# Patient Record
Sex: Male | Born: 1970 | Race: White | Hispanic: No | Marital: Married | Smoking: Former smoker
Health system: Southern US, Community
[De-identification: ages and names within clinical notes are randomized; demographics above are authoritative.]

## PROBLEM LIST (undated history)

## (undated) DIAGNOSIS — R5381 Other malaise: Secondary | ICD-10-CM

## (undated) DIAGNOSIS — T300 Burn of unspecified body region, unspecified degree: Secondary | ICD-10-CM

## (undated) DIAGNOSIS — K760 Fatty (change of) liver, not elsewhere classified: Secondary | ICD-10-CM

## (undated) DIAGNOSIS — T7840XA Allergy, unspecified, initial encounter: Secondary | ICD-10-CM

## (undated) DIAGNOSIS — R5383 Other fatigue: Secondary | ICD-10-CM

## (undated) DIAGNOSIS — D649 Anemia, unspecified: Secondary | ICD-10-CM

## (undated) DIAGNOSIS — R591 Generalized enlarged lymph nodes: Secondary | ICD-10-CM

## (undated) DIAGNOSIS — Z87891 Personal history of nicotine dependence: Secondary | ICD-10-CM

## (undated) DIAGNOSIS — L80 Vitiligo: Secondary | ICD-10-CM

## (undated) DIAGNOSIS — E291 Testicular hypofunction: Secondary | ICD-10-CM

## (undated) DIAGNOSIS — D235 Other benign neoplasm of skin of trunk: Secondary | ICD-10-CM

## (undated) DIAGNOSIS — E781 Pure hyperglyceridemia: Secondary | ICD-10-CM

## (undated) HISTORY — DX: Anemia, unspecified: D64.9

## (undated) HISTORY — DX: Pure hyperglyceridemia: E78.1

## (undated) HISTORY — DX: Other malaise: R53.81

## (undated) HISTORY — DX: Fatty (change of) liver, not elsewhere classified: K76.0

## (undated) HISTORY — PX: OTHER SURGICAL HISTORY: SHX169

## (undated) HISTORY — DX: Allergy, unspecified, initial encounter: T78.40XA

## (undated) HISTORY — DX: Other malaise: R53.83

## (undated) HISTORY — DX: Personal history of nicotine dependence: Z87.891

## (undated) HISTORY — DX: Other benign neoplasm of skin of trunk: D23.5

## (undated) HISTORY — DX: Burn of unspecified body region, unspecified degree: T30.0

## (undated) HISTORY — DX: Testicular hypofunction: E29.1

## (undated) HISTORY — DX: Generalized enlarged lymph nodes: R59.1

## (undated) HISTORY — DX: Vitiligo: L80

---

## 1996-06-12 HISTORY — PX: OTHER SURGICAL HISTORY: SHX169

## 2005-01-04 ENCOUNTER — Ambulatory Visit: Payer: Self-pay | Admitting: Family Medicine

## 2005-02-03 ENCOUNTER — Encounter: Admission: RE | Admit: 2005-02-03 | Discharge: 2005-02-03 | Payer: Self-pay | Admitting: Surgery

## 2005-03-04 ENCOUNTER — Ambulatory Visit (HOSPITAL_COMMUNITY): Admission: RE | Admit: 2005-03-04 | Discharge: 2005-03-04 | Payer: Self-pay | Admitting: Surgery

## 2005-03-04 ENCOUNTER — Encounter (INDEPENDENT_AMBULATORY_CARE_PROVIDER_SITE_OTHER): Payer: Self-pay | Admitting: Specialist

## 2005-06-16 ENCOUNTER — Ambulatory Visit: Payer: Self-pay | Admitting: Family Medicine

## 2005-06-22 ENCOUNTER — Ambulatory Visit: Payer: Self-pay | Admitting: Family Medicine

## 2006-03-27 HISTORY — PX: OTHER SURGICAL HISTORY: SHX169

## 2006-04-20 ENCOUNTER — Ambulatory Visit: Payer: Self-pay | Admitting: Family Medicine

## 2006-04-21 ENCOUNTER — Ambulatory Visit: Payer: Self-pay | Admitting: Internal Medicine

## 2006-05-24 ENCOUNTER — Ambulatory Visit: Payer: Self-pay | Admitting: Family Medicine

## 2006-05-29 ENCOUNTER — Encounter (INDEPENDENT_AMBULATORY_CARE_PROVIDER_SITE_OTHER): Payer: Self-pay | Admitting: Specialist

## 2006-05-29 ENCOUNTER — Ambulatory Visit (HOSPITAL_BASED_OUTPATIENT_CLINIC_OR_DEPARTMENT_OTHER): Admission: RE | Admit: 2006-05-29 | Discharge: 2006-05-29 | Payer: Self-pay | Admitting: Surgery

## 2006-06-09 ENCOUNTER — Ambulatory Visit: Payer: Self-pay | Admitting: Family Medicine

## 2006-06-29 ENCOUNTER — Ambulatory Visit: Payer: Self-pay | Admitting: Family Medicine

## 2006-08-08 ENCOUNTER — Ambulatory Visit: Payer: Self-pay | Admitting: Family Medicine

## 2006-11-01 ENCOUNTER — Telehealth (INDEPENDENT_AMBULATORY_CARE_PROVIDER_SITE_OTHER): Payer: Self-pay | Admitting: *Deleted

## 2006-11-03 ENCOUNTER — Encounter: Payer: Self-pay | Admitting: Family Medicine

## 2006-11-03 DIAGNOSIS — E781 Pure hyperglyceridemia: Secondary | ICD-10-CM | POA: Insufficient documentation

## 2006-11-03 DIAGNOSIS — K760 Fatty (change of) liver, not elsewhere classified: Secondary | ICD-10-CM | POA: Insufficient documentation

## 2006-11-03 DIAGNOSIS — R7309 Other abnormal glucose: Secondary | ICD-10-CM | POA: Insufficient documentation

## 2006-11-14 ENCOUNTER — Ambulatory Visit: Payer: Self-pay | Admitting: Family Medicine

## 2006-12-11 ENCOUNTER — Encounter: Payer: Self-pay | Admitting: Family Medicine

## 2007-04-05 ENCOUNTER — Ambulatory Visit (HOSPITAL_BASED_OUTPATIENT_CLINIC_OR_DEPARTMENT_OTHER): Admission: RE | Admit: 2007-04-05 | Discharge: 2007-04-05 | Payer: Self-pay | Admitting: Surgery

## 2007-04-05 ENCOUNTER — Encounter: Payer: Self-pay | Admitting: Family Medicine

## 2007-04-05 HISTORY — PX: INGUINAL HERNIA REPAIR: SUR1180

## 2008-04-09 ENCOUNTER — Telehealth: Payer: Self-pay | Admitting: Family Medicine

## 2008-04-17 ENCOUNTER — Ambulatory Visit: Payer: Self-pay | Admitting: Family Medicine

## 2008-05-16 ENCOUNTER — Telehealth: Payer: Self-pay | Admitting: Internal Medicine

## 2008-12-08 ENCOUNTER — Encounter: Payer: Self-pay | Admitting: Family Medicine

## 2008-12-09 ENCOUNTER — Ambulatory Visit: Payer: Self-pay | Admitting: Family Medicine

## 2008-12-09 DIAGNOSIS — R079 Chest pain, unspecified: Secondary | ICD-10-CM | POA: Insufficient documentation

## 2009-02-27 ENCOUNTER — Ambulatory Visit: Payer: Self-pay | Admitting: Family Medicine

## 2009-02-27 LAB — CONVERTED CEMR LAB
ALT: 25 units/L (ref 0–53)
Basophils Relative: 0.2 % (ref 0.0–3.0)
Bilirubin, Direct: 0.1 mg/dL (ref 0.0–0.3)
Chloride: 107 meq/L (ref 96–112)
Cholesterol: 183 mg/dL (ref 0–200)
Creatinine, Ser: 1.1 mg/dL (ref 0.4–1.5)
Direct LDL: 136.1 mg/dL
Eosinophils Absolute: 0.1 10*3/uL (ref 0.0–0.7)
GFR calc non Af Amer: 79.07 mL/min (ref >60)
HCT: 46.6 % (ref 39.0–52.0)
Hemoglobin: 16.4 g/dL (ref 13.0–17.0)
Lymphs Abs: 1.6 10*3/uL (ref 0.7–4.0)
MCHC: 35.1 g/dL (ref 30.0–36.0)
MCV: 84.3 fL (ref 78.0–100.0)
Monocytes Absolute: 0.4 10*3/uL (ref 0.1–1.0)
Neutro Abs: 4.8 10*3/uL (ref 1.4–7.7)
Neutrophils Relative %: 67.8 % (ref 43.0–77.0)
RBC: 5.53 M/uL (ref 4.22–5.81)
Total Bilirubin: 1.1 mg/dL (ref 0.3–1.2)
Total CHOL/HDL Ratio: 8
VLDL: 42.8 mg/dL — ABNORMAL HIGH (ref 0.0–40.0)

## 2009-03-03 ENCOUNTER — Ambulatory Visit: Payer: Self-pay | Admitting: Family Medicine

## 2009-03-03 DIAGNOSIS — L80 Vitiligo: Secondary | ICD-10-CM | POA: Insufficient documentation

## 2009-12-02 ENCOUNTER — Encounter: Payer: Self-pay | Admitting: Family Medicine

## 2009-12-02 ENCOUNTER — Ambulatory Visit: Payer: Self-pay | Admitting: Family Medicine

## 2010-01-12 ENCOUNTER — Ambulatory Visit: Payer: Self-pay | Admitting: Family Medicine

## 2010-01-25 ENCOUNTER — Ambulatory Visit: Payer: Self-pay | Admitting: Family Medicine

## 2010-01-25 DIAGNOSIS — A281 Cat-scratch disease: Secondary | ICD-10-CM | POA: Insufficient documentation

## 2010-01-27 ENCOUNTER — Emergency Department (HOSPITAL_COMMUNITY): Admission: EM | Admit: 2010-01-27 | Discharge: 2010-01-27 | Payer: Self-pay | Admitting: Emergency Medicine

## 2010-01-30 ENCOUNTER — Emergency Department (HOSPITAL_COMMUNITY): Admission: EM | Admit: 2010-01-30 | Discharge: 2010-01-30 | Payer: Self-pay | Admitting: Emergency Medicine

## 2010-02-02 ENCOUNTER — Encounter (INDEPENDENT_AMBULATORY_CARE_PROVIDER_SITE_OTHER): Payer: Self-pay | Admitting: *Deleted

## 2010-02-03 ENCOUNTER — Emergency Department (HOSPITAL_COMMUNITY): Admission: EM | Admit: 2010-02-03 | Discharge: 2010-02-03 | Payer: Self-pay | Admitting: Family Medicine

## 2010-02-10 ENCOUNTER — Emergency Department (HOSPITAL_COMMUNITY): Admission: EM | Admit: 2010-02-10 | Discharge: 2010-02-10 | Payer: Self-pay | Admitting: Emergency Medicine

## 2010-02-25 HISTORY — PX: MOUTH SURGERY: SHX715

## 2010-04-20 ENCOUNTER — Telehealth: Payer: Self-pay | Admitting: Family Medicine

## 2010-07-19 ENCOUNTER — Ambulatory Visit
Admission: RE | Admit: 2010-07-19 | Discharge: 2010-07-19 | Payer: Self-pay | Source: Home / Self Care | Attending: Family Medicine | Admitting: Family Medicine

## 2010-07-19 LAB — CONVERTED CEMR LAB
Bilirubin Urine: NEGATIVE
Blood in Urine, dipstick: NEGATIVE
Glucose, Urine, Semiquant: NEGATIVE
Ketones, urine, test strip: NEGATIVE
Protein, U semiquant: NEGATIVE
Specific Gravity, Urine: 1.01
pH: 6

## 2010-07-27 NOTE — Progress Notes (Signed)
Summary: ??kidney stones   Phone Note Call from Patient Call back at Home Phone [phone]   Caller: Patient Call For: Dr. Milinda Antis  Summary of Call: Patient called stating that he feels like he has kidney stones. He has been having alot of back pain. He said that it is a sharp pain on the left side.  He says that he is not having any trouble urinating, hasn't noticed any blood in urine. He doesn't want to make an appt just yet. He would like to see if this passes on it's on. He is asking if you have any suggestions that he can do. He said that he will call back in a couple of days if he is still having pain.  Initial call taken by: Melody Comas,  April 20, 2010 1:22 PM  Follow-up for Phone Call        he does need to follow up for this with first availible physician drink lots of water in meantime and go to eR if severe back pain Follow-up by: Judith Part MD,  April 20, 2010 1:42 PM  Additional Follow-up for Phone Call Additional follow up Details #1::        Spoke with patient, advised him to dring plenty of water. Advised him to make appt. He made appt. for thursday morning w/ Dr. Reece Agar and will call back to cancel if he feels that he doesn't need appt.  Additional Follow-up by: Melody Comas,  April 20, 2010 1:50 PM

## 2010-07-27 NOTE — Letter (Signed)
Summary: Nadara Eaton letter  [place] at [facility]  [address] [place], [place] [address]   Phone: [phone]  Fax: [phone]       02/02/2010 MRN: [id number]  BARTOW ZYLSTRA [address] [place], [place]  [address]  Dear Mr. [facility] [place], and [place] announce the retirement of Arta Silence, M.D., from full-time practice at the [facility] office effective December 24, 2009 and his plans of returning part-time.  It is important to Dr. Hetty Ely and to our practice that you understand that [facility] [place] has seven physicians in our office for your health care needs.  We will continue to offer the same exceptional care that you have today.    Dr. Hetty Ely has spoken to many of you about his plans for retirement and returning part-time in the fall.   We will continue to work with you through the transition to schedule appointments for you in the office and meet the high standards that [facility] is committed to.   Again, it is with great pleasure that we share the news that Dr. Hetty Ely will return to [facility] in October of 2011 with a reduced schedule.    If you have any questions, or would like to request an appointment with one of our physicians, please call us at [phone] and press the option for Scheduling an appointment.  We take pleasure in providing you with excellent patient care and look forward to seeing you at your next office visit.  Our [facility] Physicians are:  Tillman Abide, M.D. Laurita Quint, M.D. Roxy Manns, M.D. Kerby Nora, M.D. Hannah Beat, M.D. Ruthe Mannan, M.D. We proudly welcomed Raechel Ache, M.D. and Eustaquio Boyden, M.D. to the practice in July/August 2011.  Sincerely,  [facility]

## 2010-07-27 NOTE — Assessment & Plan Note (Signed)
Summary: RIB PAIN/CLE   Vital Signs:  Patient profile:   40 year old male Weight:      219 pounds BMI:     27.47 Temp:     97.9 degrees F oral Pulse rate:   84 / minute Pulse rhythm:   regular BP sitting:   110 / 74  (left arm) Cuff size:   large  Vitals Entered By: Sydell Axon LPN (December 03, 3555 12:12 PM) CC: Left side rib pain   History of Present Illness: Pt here for left sided chest pain that started last night. He had a difficult time sleeping last night due to inablility to get comfortable. He was swimming yesterday afternoon with his wife4, did nothing unusual that he remembers. He did get out the side of the pool using his hands/arms without use of a .ladder...jumping up onto the platform using his arms. He does not remember feeling anything unusual. He has never had pain in the lateral side of the chest before. He denies bites and has no echymosis. Movement of his arms bothers hime and deep breating bothers him. No position last night seemed to help. He has not taken any medication.  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 Head:  Normocephalic and atraumatic without obvious abnormalities. No apparent alopecia or balding. Sinuses NT. Eyes:  Conjunctiva clear bilaterally.  Ears:  External ear exam shows no significant lesions or deformities.  Otoscopic examination reveals clear canals, tympanic membranes are intact  bilaterally without bulging, retraction, inflammation or discharge. Hearing is grossly normal bilaterally. Nose:  External nasal examination shows no deformity or inflammation. Nasal mucosa are pink and moist without lesions or exudates. Mouth:  Oral mucosa and oropharynx without lesions or exudates.  Teeth in good repair. Neck:  No deformities, masses, or tenderness noted. Chest Wall:  Tender to palpation in the area of fiftjh/sixth ribs laterally , no swelling or echymosis. He is unable to tell exact location of the discomfort. Lungs:  Normal respiratory effort, chest expands symmetrically. Lungs are clear to auscultation, no crackles or wheezes. Heart:  Normal rate and regular rhythm. S1 and S2 normal without gallop, murmur, click, rub or other extra sounds.   Impression & Recommendations:  Problem # 1:  CHEST PAIN (ICD-786.50) Will treat as chest wall musc strain, unknown etiology. See instructions. Orders: CXR- 2view (CXR)  Looks Nml, await reading.  Patient Instructions: 1)  Take Aleve 200mg , 3 Tabs after brfst, 2 tabs after supper.  2)  Use heat and ice as dir.  3)  May apply topical meds as needed.  Prior Medications (reviewed today): None Current Allergies (reviewed today): No known allergies

## 2010-07-27 NOTE — Assessment & Plan Note (Signed)
Summary: ACUTE EAR INFECTION/RBH   Vital Signs:  Patient profile:   40 year old male Height:      75 inches Weight:      220.25 pounds BMI:     27.63 Temp:     98.2 degrees F oral Pulse rate:   76 / minute Pulse rhythm:   regular BP sitting:   104 / 70  (left arm) Cuff size:   large  Vitals Entered By: Lewanda Rife LPN (January 12, 2010 4:19 PM) CC: Acute rt ear infection. pt said rt ear and jaw has hurt for 3 weeks. irritated throat and h/a on and off   History of Present Illness: R ear hurts for 3 weeks - thought it was swimmer's ear- has a pool  is sore to the touch cannot hear well out of it  used ear relief drops otc  advil orally   now jaw hurts and throat is irritated  gums feel swollen -- does have bad teeth as well  hears clicking and popping inside ear  some headache no fever -- but temp is higher than usual today     Allergies (verified): No Known Drug Allergies  Review of Systems General:  Complains of fatigue; denies fever, loss of appetite, and malaise. Eyes:  Denies blurring, discharge, and eye irritation. ENT:  Complains of earache, postnasal drainage, and sore throat; denies ear discharge, ringing in ears, and sinus pressure. CV:  Denies chest pain or discomfort and palpitations. Resp:  Denies cough and wheezing. GI:  Denies nausea and vomiting. Derm:  Denies itching, lesion(s), poor wound healing, and rash. Neuro:  Complains of headaches; denies tingling. Heme:  Denies abnormal bruising and bleeding.  Physical Exam  General:  Well-developed,well-nourished,in no acute distress; alert,appropriate and cooperative throughout examination Head:  normocephalic, atraumatic, and no abnormalities observed.  no sinus or temporal tenderness some mild tenderness over R TMJ and also clicking  Eyes:  vision grossly intact, pupils equal, pupils round, pupils reactive to light, and no injection.   Ears:  R ear - clear/ slt retracted TM with some erythema and swelling  of canal (no d/c) and very scant cerumen nl app L ear  Nose:  no nasal discharge.   Mouth:  pharynx pink and moist.   Neck:  No deformities, masses, or tenderness noted. Chest Wall:  No deformities, masses, tenderness or gynecomastia noted. Lungs:  Normal respiratory effort, chest expands symmetrically. Lungs are clear to auscultation, no crackles or wheezes. Heart:  Normal rate and regular rhythm. S1 and S2 normal without gallop, murmur, click, rub or other extra sounds. Skin:  Intact without suspicious lesions or rashes Cervical Nodes:  No lymphadenopathy noted Psych:  normal affect, talkative and pleasant    Impression & Recommendations:  Problem # 1:  OTITIS EXTERNA (ICD-380.10) Assessment New  with some swelling in R ear and muffled hearing (no d/c)  will tx with corticosporin drops and update also in light of some tenderness over TMJ- adv soft foods and no gum for the next week  pt advised to update me if symptoms worsen or do not improve - esp if fever or other symptoms His updated medication list for this problem includes:    Cortisporin 3.5-10000-1 Soln (Neomycin-polymyxin-hc) .Marland Kitchen... 2 drops in affected ear four times daily while awake  Orders: Prescription Created Electronically [phone])  Complete Medication List: 1)  Advil 200 Mg Tabs (Ibuprofen) .... Otc as directed. 2)  Cortisporin 3.5-10000-1 Soln (Neomycin-polymyxin-hc) .... 2 drops in affected ear four  times daily while awake  Patient Instructions: 1)  use warm compress under ear and over jaw joint when you can  2)  take advil 1-2 with food up to three times a day  3)  if not improving next week- call 4)  also avoid chewy foods and gum Prescriptions: CORTISPORIN 3.5-10000-1 SOLN (NEOMYCIN-POLYMYXIN-HC) 2 drops in affected ear four times daily while awake  #1 bottle x 0   Entered and Authorized by:   Judith Part MD   Signed by:   Judith Part MD on 01/12/2010   Method used:   Electronically to         [facility] (retail)       [address]       [place], [place]  [address]       Ph: [phone]       Fax: [phone]   RxID:   [id number]   Current Allergies (reviewed today): No known allergies

## 2010-07-27 NOTE — Letter (Signed)
Summary: Out of Work  [employer] at [facility]  [address] [place], [place] [address]   Phone: [phone]  Fax: [phone]    December 02, 2009   Employee:  Keith [employer] [facility]    To Whom It May Concern:   For Medical reasons, please excuse the above named employee from work today because of bieing seen for left lateral chestwall pain   Start:   12/02/2009  He is able to go back to work.  If you need additional information, please feel free to contact our office.         Sincerely,    Shaune Leeks MD

## 2010-07-27 NOTE — Assessment & Plan Note (Signed)
Summary: CAT BITE.DLO   Vital Signs:  Patient profile:   40 year old male Height:      75 inches Weight:      219.25 pounds BMI:     27.50 Temp:     98.2 degrees F oral Pulse rate:   84 / minute Pulse rhythm:   regular BP sitting:   110 / 72  (left arm) Cuff size:   large  Vitals Entered By: Lewanda Rife LPN (January 25, 2010 11:53 AM) CC: cat bite to left hand end of June. Now has lump under lt arm and upper arm hurts. found lump under arm on 01/22/10.   History of Present Illness: was bitten by a cat --he picked up a wild cat  bit him in the side of his hand  that was 3-4 weeks ago -- is well healed but now has a lump under it -not hurting or draining has LN under arm now that is   was provoked and he is not worried about rabies - [place]   Td 2006  no temp now felt a little fever at home -- felt warm in general  woke up with night sweats   no rash  has woken up with sore throat no headache   the LN is sore   has not taken any anti inflam -- ibuprofen occas     Allergies (verified): No Known Drug Allergies  Past History:  Past Surgical History: Last updated: 04/09/2007 ETT; NORMAL01/13/2003 TESTICULAR  ULTASOUND NORMAL: 10/30/2002 LYMOH NODE BIOPSY, RIGHT SUPRACLAV. AREA : NEGATIVE 04/2005 CHEST CT WITH CONTRAST , NEGATIVE; PELVIC CT, NEGATIVE: 02/03/2005 ABD CT WITH CONTRAST, MILD SPLENOMEGALY, FATTY LIVER, INDETERM UPPER ABD ADENOP 02/03/2005  NECK CT WITH CONTRAST RIGHT SUPRACLAV. ADENPOTHY----- BX 02/03/2005 RIGHT CERVICAL LYMPH NODE BIOPSY NEGATIVE: 05/29/2006 L ING HERNIA REPAIR (DR CORNETT) 04/05/2007  Family History: Last updated: 2009-03-16 Father: DECEASED 45  PER MOTHER OF MI Mother: A 69 , HTN ,DM GRANDPARENTS: GM : HTN SISTER A 41 HTN DM Obese CV. + FATHER DIED MI AGE 5 HBP: +MGM, MOTHER DM: + GESTATIONAL (SISTER) , MOTHER BREAST/OVARIAN/UTERINE/CANCER : NEGATIVE DEPRESSION: NEGATIVE ETOH/DRUG ABUSE: + ETOH M  UNCLE OTHER:NEGATIVE STROKE  Social History: Last updated: 11/03/2006 Marital Status: Married/ REMARRIED Lives w/ wife  11/2004 Children: 1 WITH WIFE Occupation: MAILMAN  Risk Factors: Alcohol Use: 0 (03/16/2009) Caffeine Use: >5 (March 16, 2009) Exercise: yes (Mar 16, 2009)  Risk Factors: Smoking Status: quit (03/16/2009) Packs/Day: 1995 (03/16/2009)  Review of Systems General:  Complains of fatigue and fever; denies chills, loss of appetite, and malaise. Eyes:  Denies discharge and eye irritation. ENT:  Complains of sore throat; denies postnasal drainage and sinus pressure. CV:  Denies chest pain or discomfort, palpitations, and shortness of breath with exertion. Resp:  Denies cough, shortness of breath, and wheezing. GI:  Denies abdominal pain, change in bowel habits, nausea, and vomiting. MS:  Denies joint pain, joint redness, and joint swelling. Derm:  Complains of lesion(s); denies itching and rash. Neuro:  Denies numbness and tingling. Heme:  Denies abnormal bruising and bleeding.  Physical Exam  General:  Well-developed,well-nourished,in no acute distress; alert,appropriate and cooperative throughout examination Head:  normocephalic, atraumatic, and no abnormalities observed.   Eyes:  vision grossly intact, pupils equal, pupils round, and pupils reactive to light.   Ears:  R ear normal and L ear normal.   Mouth:  pharynx pink and moist.   Neck:  No deformities, masses, or tenderness noted. Chest Wall:  No deformities, masses, tenderness or gynecomastia noted. Breasts:  No masses or gynecomastia noted Lungs:  Normal respiratory effort, chest expands symmetrically. Lungs are clear to auscultation, no crackles or wheezes. Heart:  Normal rate and regular rhythm. S1 and S2 normal without gallop, murmur, click, rub or other extra sounds. Msk:  see skin exam  Extremities:  No clubbing, cyanosis, edema, or deformity noted with normal full range of motion of all joints.    Neurologic:  gait normal and DTRs symmetrical and normal.   Skin:  L hand has 1 cm area of induration and erythema in hypothenar area  is non tender  small scab- no drainage firm in texture  nl rom fingers -- nl perfusion and sensation Cervical Nodes:  No lymphadenopathy noted Axillary Nodes:  1-2 cm mobile axillary mass/ induration - slt erythema , is slt tender  Inguinal Nodes:  No significant adenopathy Psych:  normal affect, talkative and pleasant    Impression & Recommendations:  Problem # 1:  CAT BITE (ICD-E906.3) Assessment New  bite occured from non domesticated kitten 3-4 weeks ago and per pt was provoked  now some symptoms of cat scratch fever- is likely - disc self limiting nature of this/ sympt care  adv to call if inc in LN or pain or fever at all  Td updated augmentin initiated for coverage of pasturella - in light of slt erythema of bite spoke with Dr Call at [facility] to get opinion regarding rabies prophylaxis  due to long incubation period for rabies - he recommends pt proceed to ER for eval and to start series for rabies innoculation pt was instructed to do so  Orders: Prescription Created Electronically [phone])  Problem # 2:  CAT SCRATCH DISEASE (ICD-078.3) Assessment: New with swollen axillary LN and low grade fever/ st  explained this is self limited and we tx with analgesics/ rest  update if severe headache/ high fever or if inc in size or pain of node  Complete Medication List: 1)  Advil 200 Mg Tabs (Ibuprofen) .... Otc as directed. 2)  Cortisporin 3.5-10000-1 Soln (Neomycin-polymyxin-hc) .... 2 drops in affected ear four times daily while awake 3)  Augmentin 875-125 Mg Tabs (Amoxicillin-pot clavulanate) .Marland Kitchen.. 1 by mouth two times a day for 10 days for cat bite  Other Orders: TD Toxoids IM 7 YR + (95621) Admin 1st Vaccine (30865)  Patient Instructions: 1)  I recommend you go to [facility] emergency room for evaluation of possible  rabies exposure  2)  please take augmentin for cat bite as directed 3)  update me if bite site gets any more red or swollen  4)  if your lymph node does not start to go down in 2 weeks let me know or if it gets more larger and more painful  Prescriptions: AUGMENTIN 875-125 MG TABS (AMOXICILLIN-POT CLAVULANATE) 1 by mouth two times a day for 10 days for cat bite  #20 x 0   Entered and Authorized by:   Judith Part MD   Signed by:   Judith Part MD on 01/25/2010   Method used:   Electronically to        [facility] (retail)       [address]       [place], [place]  [address]       Ph: [phone]       Fax: [phone]   RxID:  [id number]   Current Allergies (reviewed today): No known allergies     Immunizations Administered:  Tetanus Vaccine:    Vaccine Type: Td    Site: left deltoid    Mfr: Sanofi Pasteur    Dose: 0.5 ml    Route: IM    Given by: Lewanda Rife LPN    Exp. Date: 07/29/2011    Lot #: [id number]    VIS given: 05/15/07 version given January 25, 2010.

## 2010-07-29 NOTE — Assessment & Plan Note (Signed)
Summary: BACK PAIN / LFW   Vital Signs:  Patient profile:   40 year old male Weight:      222.75 pounds Temp:     97.8 degrees F oral Pulse rate:   74 / minute Pulse rhythm:   regular BP sitting:   116 / 84  (left arm) Cuff size:   large  Vitals Entered By: Selena Batten Dance CMA Duncan Dull) (July 19, 2010 3:51 PM) CC: LBP   History of Present Illness: CC: ? kidney stone  03/2010 - started having L sided lower back pain underneath ribcage.  Characterized as sharp shooting pain.  Stayed in lower back.  No radiation.  No n/v.  Lasted 1-2 days, got better with heating pad.  Used Tylenol PM as well.  not positional or worse with certain movements.    Now seems to be soreness staying L flank, intermittent.  Waking him up at night, last time was a couple weeks ago.  currently not with any pain.  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    No dysuria, urgency, frequency.  No n/v, f/c.  No abd pain.  No shooting pain down legs, no numbness/tingling in legs.  Current Medications (verified): 1)  Advil 200 Mg Tabs (Ibuprofen) .... Otc As Directed.  Allergies (verified): No Known Drug Allergies  Past History:  Social History: Last updated: 07/19/2010 quit smoking 2011, no EtOH, no rec drugs. caffeine: 3 cups/day Marital Status: Married/ REMARRIED Lives w/ wife  11/2004 Children: 1 WITH WIFE Occupation: MAILMAN  Past Surgical History: ETT; NORMAL01/13/2003 TESTICULAR  ULTASOUND NORMAL: 10/30/2002 LYMOH NODE BIOPSY, RIGHT SUPRACLAV. AREA : NEGATIVE 04/2005 CHEST CT WITH CONTRAST , NEGATIVE; PELVIC CT, NEGATIVE: 02/03/2005 ABD CT WITH CONTRAST, MILD SPLENOMEGALY, FATTY LIVER, INDETERM UPPER ABD ADENOP 02/03/2005  NECK CT WITH CONTRAST RIGHT SUPRACLAV. ADENPOTHY----- BX 02/03/2005 RIGHT CERVICAL LYMPH NODE BIOPSY NEGATIVE: 05/29/2006 L ING HERNIA REPAIR (DR CORNETT) 04/05/2007 Oral surgery 02/2010 PMH-FH-SH reviewed for relevance  Social History: quit smoking 2011, no  EtOH, no rec drugs. caffeine: 3 cups/day Marital Status: Married/ REMARRIED Lives w/ wife  11/2004 Children: 1 WITH WIFE Occupation: MAILMAN  Review of Systems       per HPI  Physical Exam  General:  Well-developed,well-nourished,in no acute distress; alert,appropriate and cooperative throughout examination Mouth:  pharynx pink and moist.  poor dentition Neck:  No deformities, masses, or tenderness noted. Lungs:  Normal respiratory effort, chest expands symmetrically. Lungs are clear to auscultation, no crackles or wheezes. Heart:  Normal rate and regular rhythm. S1 and S2 normal without gallop, murmur, click, rub or other extra sounds. Abdomen:  Bowel sounds positive,abdomen soft and non-tender without masses, organomegaly or hernias noted.  no CVA tenderness bilaterally.  R slight swelling of lateral abdomen, however no masses appreciated. Msk:  FROM at spine Pulses:  2+ rad pulses Extremities:  No clubbing, cyanosis, edema, or deformity noted with normal full range of motion of all joints.     Impression & Recommendations:  Problem # 1:  BACK PAIN (ICD-724.5) Assessment New  does sound consistent with possible h/o kidney stone however would have expected resolution of sxs if passed and UA today without evidence of hematuria.  Alternatively could be MSK, spasm.  Advised pt to return when having flare for rpt UA, for now treat conservatively with muscle relaxant/NSAID.  no red flags, weight stable.  His updated medication list for this problem includes:    Advil 200 Mg Tabs (Ibuprofen) ..... Otc as  directed.  Orders: UA Dipstick w/o Micro (manual) (40981)  Complete Medication List: 1)  Advil 200 Mg Tabs (Ibuprofen) .... Otc as directed.  Patient Instructions: 1)  Return when having a flare. 2)  Treat for now with muscle relaxant and advil 400-600mg  as needed.  If not getting better, please return to be seen. 3)  Good to meet you today, call clinic wtih qestions. 4)   urine looking normal today without blood   Orders Added: 1)  UA Dipstick w/o Micro (manual) [81002] 2)  Est. Patient Level III [19147]    Current Allergies (reviewed today): No known allergies   Laboratory Results   Urine Tests  Date/Time Received: July 19, 2010 3:56 PM  Date/Time Reported: July 19, 2010 3:56 PM   Routine Urinalysis   Color: lt. yellow Appearance: Clear Glucose: negative   (Normal Range: Negative) Bilirubin: negative   (Normal Range: Negative) Ketone: negative   (Normal Range: Negative) Spec. Gravity: 1.010   (Normal Range: 1.003-1.035) Blood: negative   (Normal Range: Negative) pH: 6.0   (Normal Range: 5.0-8.0) Protein: negative   (Normal Range: Negative) Urobilinogen: 0.2   (Normal Range: 0-1) Nitrite: negative   (Normal Range: Negative) Leukocyte Esterace: negative   (Normal Range: Negative)

## 2010-11-09 NOTE — Op Note (Signed)
Keith Bishop, Keith Bishop            ACCOUNT NO.:  [id number]   MEDICAL RECORD NO.:  [id number]          PATIENT TYPE:  AMB   LOCATION:  [facility]                         FACILITY:  [facility]   PHYSICIAN:  Thomas A. Cornett, M.D.DATE OF BIRTH:  August 29, 1970   DATE OF PROCEDURE:  04/05/2007  DATE OF DISCHARGE:                               OPERATIVE REPORT   PREOPERATIVE DIAGNOSIS:  Left inguinal hernia.   POSTOPERATIVE DIAGNOSIS:  Left inguinal hernia.   PROCEDURE:  Repair of left inguinal hernia with mesh.   SURGEON:  Maisie Fus A. Cornett, M.D.   ANESTHESIA:  LMA with 0.25% local anesthesia.   ESTIMATED BLOOD LOSS:  10 mL.   SPECIMENS:  None.   INDICATIONS FOR PROCEDURE:  The patient is a 40 year old male with a  reducible left inguinal hernia which is symptomatic and he wished to  have it repaired.  He presents today for repair of this.  Informed  consent was obtained prior to the procedure.   DESCRIPTION OF PROCEDURE:  The patient was brought to the operating room  and placed supine.  After induction of general anesthesia, the left  inguinal region was prepped and draped in a sterile fashion.  0.25%  Sensorcaine local anesthesia was infiltrated in the left inguinal  crease.  An oblique incision was made in the left inguinal region.  Dissection was carried down through Scarpa's fascia until I encountered  the aponeurosis of the external oblique.  I infiltrated this with  additional 0.25% Sensorcaine.  The fibers were opened in the direction  of the fibers to open the inguinal canal.  Cord structures were  identified and encircled with a 1/2 inch Penrose drain.  Upon  examination, the floor appeared quite solid.  There was an indirect  hernia sac that extended down toward his left testicle.  I was able to  dissect the sac off the cord completely.  I opened the sac, there were  no contents within it, and then I ligated the sac with 3-0 Vicryl.  I  then reduced the sac back into the  preperitoneal space.  I used my  finger to open the preperitoneal space for placement of the mesh.  I  then used a large Prolene hernia system polypropylene mesh, placed the  large inner leaflet in the preperitoneal space, and used my finger to  help deploy this in the preperitoneal space.  I then laid the onlay on  the floor of the inguinal canal.  A slit was cut for the cord  structures.  I secured the mesh to Cooper's ligament using 0 Vicryl.  A  second 0 Vicryl was used to control the connector piece of the mesh to  the internal oblique.  The ilioinguinal nerve was trapped in the mesh,  there was no way to put this without it being impinged by the mesh, so I  divided it as it exited the muscle.  The iliohypogastric nerve was  identified and I was able to shove this away bluntly to get it out of  the operative field.  I then secured the mesh with a #1 Novofil to the  shelving edge of the inguinal ligament and to the internal oblique  medially and conjoined tendon inferiorly.  I closed the mesh around the  cord using #1 Novofil and was able to insert the tip my fifth digit in  the new internal ring without any evidence of trapping of the cord.  Irrigation was used and suctioned out.  The repair looked very good.  I  then closed the fascia of the external oblique with 2-0 Vicryl.  3-0  Vicryl was used to reapproximate  Scarpa's fascia.  4-0 Monocryl was used to close the skin in  subcuticular fashion.  Dermabond was used to dress the wound.  All final  counts of sponge, needle and instruments were found to be correct at  this portion of the case.  The patient was then awakened and taken to  recovery in satisfactory condition.      Thomas A. Cornett, M.D.  Electronically Signed     TAC/MEDQ  D:  04/05/2007  T:  04/05/2007  Job:  [id number]   cc:   Arta Silence, MD  Fax: [phone]

## 2010-11-12 NOTE — Op Note (Signed)
NAMECANTON, YEARBY            ACCOUNT NO.:  [id number]   MEDICAL RECORD NO.:  [id number]          PATIENT TYPE:  AMB   LOCATION:  DAY                          FACILITY:  [facility]   PHYSICIAN:  Thomas A. Cornett, M.D.DATE OF BIRTH:  Jun 15, 1971   DATE OF PROCEDURE:  03/04/2005  DATE OF DISCHARGE:                                 OPERATIVE REPORT   PREOPERATIVE DIAGNOSES:  Right supraclavicular/cervical lymphadenopathy  concerning for lymphoma.   POSTOPERATIVE DIAGNOSES:  Right supraclavicular/cervical lymphadenopathy  concerning for lymphoma.   PROCEDURE:  Right cervical deep lymph node biopsy.   SURGEON:  Maisie Fus A. Cornett, M.D.   ANESTHESIA:  MAC with 20 mL of 0.25% Sensorcaine.   ESTIMATED BLOOD LOSS:  10 mL.   SPECIMENS:  Cervical lymph node to pathology.   INDICATIONS FOR PROCEDURE:  The patient is a 40 year old male who has a  history of lymphadenopathy. Workup reveals significant cervical  lymphadenopathy. The most prominent position was the right supraclavicular  region at the base of the right neck. There was also some mild adenopathy in  the abdomen with splenomegaly. I recommended a lymph node biopsy at this  point for further evaluation to exclude lymphoma with this workup.   DESCRIPTION OF PROCEDURE:  The patient was brought to the operating room and  placed supine. The right neck was prepped and draped in a sterile fashion.  MAC anesthesia was initiated. I infiltrated the region just lateral to the  sternocleidomastoid in the right side just above the clavicle with 0.25%  Sensorcaine. An incision was made and dissection was carried down through  the platysma muscle and this was opened. Then I ran my finger and just above  the clavicle posterior to the right sternocleidomastoid muscle and lateral  to the carotid sheath was a roughly 2 cm hard lymph node. I used hemostats  to spread down onto it and grab the tissue around the lymph node and pull it  up into the  wound. I carefully dissected away all loose areolar_ tissue with  great care taken to stay on the lymph node itself. Small lymphatics entering  the node were controlled with clips. Once I was able to dissect out the  lymph node circumferentially, the two remaining lymphatics holding it in  place were clipped and divided and the lymph node was removed. There was a  considerable amount of inflammatory change around the lymph node as well.  The lymph node was passed off the field. I inspected the wound and it  appeared clear without any signs of significant lymph  fluid drainage nor bleeding. At this point in time, I reapproximated the  platysma muscle with 4-0 Vicryl and the skin with 4-0 Monocryl. Steri-Strips  and dry dressings were applied. All final counts were correct. The patient  was awoke, taken to recovery in satisfactory condition.      Thomas A. Cornett, M.D.  Electronically Signed     TAC/MEDQ  D:  03/04/2005  T:  03/04/2005  Job:  [id number]

## 2010-11-12 NOTE — Op Note (Signed)
NAMEGEOFFERY, AULTMAN            ACCOUNT NO.:  [id number]   MEDICAL RECORD NO.:  [id number]          PATIENT TYPE:  AMB   LOCATION:  [facility]                          FACILITY:  [facility]   PHYSICIAN:  Thomas A. Cornett, M.D.DATE OF BIRTH:  10-13-1970   DATE OF PROCEDURE:  05/29/2006  DATE OF DISCHARGE:                               OPERATIVE REPORT   PREOPERATIVE DIAGNOSIS:  Cervical lymphadenopathy.   POSTOPERATIVE DIAGNOSIS:  Cervical lymphadenopathy.   PROCEDURE:  Right cervical lymph node biopsy deep.   SURGEON:  Harriette Bouillon, MD.   ANESTHESIA:  MAC with 0.25% Sensorcaine local.   SPECIMEN:  Right cervical lymph node to pathology.   INDICATIONS FOR PROCEDURE:  The patient is a 40 year old male who  underwent a supraclavicular lymph node biopsy about a year ago.  Concern  was for lymphoma due to cervical and mediastinal lymphadenopathy.  The  biopsies showed nothing but necrosis and the patient was followed.  He  has continuation of lymphadenopathy and now has new cervical lymph nodes  that have enlarged in his right neck.  His primary care doctor asked me  to repeat his lymph node biopsy since his condition had changed and he  is here today to have that done to exclude lymphoma.   DESCRIPTION OF PROCEDURE:  The patient brought to the operating room,  placed supine.  The right neck was prepped and draped in sterile  fashion.  There is roughly 1.5 cm enlarged node in the right cervical  chain, corresponding to a level four cervical lymph node.  Behind the  sternocleidomastoid muscle.  Incision was made over this.  I dissected  down and split the fibers of platysma.  I then encountered the  sternocleidomastoid muscle and pushed its fibers anteriorly to expose a  lymph node which laid along the posterior deep margin of the  sternocleidomastoid muscle at the junction of level 3 and level 4.  This  node was identified and was removed without difficulty.  Great care was  taken to  stay near the lymph node to avoid damaging any other  structures.  We were able to remove the lymph node and passed it off and  sent to pathology for evaluation.  The operative site was hemostatic  with no signs of bleeding or lymph leak.  I closed the  sternocleidomastoid muscle with 3-0 Vicryl and 4-0 Monocryl was used to  close the skin incision.  Steri-Strips and dry dressings were applied.  All final counts of sponge, needle and instruments were found to be  correct for this portion of case.  The patient was awoke, taken to  recovery in satisfactory condition.     Thomas A. Cornett, M.D.  Electronically Signed    TAC/MEDQ  D:  05/29/2006  T:  05/30/2006  Job:  [id number]   cc:   Arta Silence, MD

## 2011-01-14 ENCOUNTER — Encounter: Payer: Self-pay | Admitting: Family Medicine

## 2011-01-14 ENCOUNTER — Ambulatory Visit (INDEPENDENT_AMBULATORY_CARE_PROVIDER_SITE_OTHER): Payer: Self-pay | Admitting: Family Medicine

## 2011-01-14 DIAGNOSIS — R5381 Other malaise: Secondary | ICD-10-CM

## 2011-01-14 DIAGNOSIS — K0889 Other specified disorders of teeth and supporting structures: Secondary | ICD-10-CM

## 2011-01-14 DIAGNOSIS — L80 Vitiligo: Secondary | ICD-10-CM

## 2011-01-14 DIAGNOSIS — K089 Disorder of teeth and supporting structures, unspecified: Secondary | ICD-10-CM

## 2011-01-14 DIAGNOSIS — R5383 Other fatigue: Secondary | ICD-10-CM

## 2011-01-14 MED ORDER — AMOXICILLIN 500 MG PO CAPS: 500.0000 mg | ORAL_CAPSULE | Freq: Two times a day (BID) | ORAL | Status: AC

## 2011-01-14 MED ORDER — CLOBETASOL PROPIONATE 0.05 % EX CREA: TOPICAL_CREAM | Freq: Every day | CUTANEOUS | Status: DC

## 2011-01-14 NOTE — Patient Instructions (Addendum)
Take antibiotic (amoxicillin twice daily for 10 days) for mouth. Use steroid cream for skin daily for 3 wks to see if any improvement.  If worsening or not improving, let me know for dermatology referral. Checked blood work today. Return in 1 month for follow up, sooner if needed.

## 2011-01-14 NOTE — Progress Notes (Signed)
  Subjective:    Patient ID: Keith Bishop, male    DOB: [birth year], 40 y.o.   MRN: [id number]  HPI CC: malaise  Started feeling ill several months ago.    Swelling jaw and gums swollen.  H/o periodontal disease, scheduled to see periodontist next month, planning on getting work done.  Feels hot all the time.  Recently having night sweats.  Hasn't checked temperature but subjective fevers.  Sometimes dizzy.  Ear hurts.  Hasn't been in pool.  Using OTC med, helps minimally.  H/o swimmer's ear last year, kind of feels like that.  Always tired - averaging 7 1/2 hours at night.  Falling asleep fine.  Eating normally.  Feeling tired, even during vacation recently.  Mood ok overall.  Libido ok, somewhat decreased.  Unsure about family h/o testosterone issues.  Father died of MI mid 30s.  Vitiligo seems to be spreading.  Started noticing a few weeks ago.  Also noticing bald spots where vitiligo occurring.  No one else ill around him.  No smoking, quit 2mo ago.  No blood in stool, no blood in urine, no rashes, skin lesions or infections, no swollen glands LE.  No weight changes. Wt Readings from Last 3 Encounters:  01/14/11 207 lb 12 oz (94.235 kg)  07/19/10 222 lb 12 oz (101.039 kg)  01/25/10 219 lb 4 oz (99.451 kg)  states tends to lose weight in summer, gains in winter.  weight ranges from 200-220lbs.  Review of Systems Per HPI    Objective:   Physical Exam  Nursing note and vitals reviewed. Constitutional: He appears well-developed and well-nourished. No distress.  HENT:  Head: Normocephalic and atraumatic.  Right Ear: Hearing, tympanic membrane, external ear and ear canal normal.  Left Ear: Hearing, tympanic membrane, external ear and ear canal normal.  Nose: Nose normal. No mucosal edema or rhinorrhea.  Mouth/Throat: Uvula is midline and oropharynx is clear and moist. He does not have dentures. No oral lesions. Abnormal dentition. Dental abscesses and dental caries present. No  oropharyngeal exudate, posterior oropharyngeal edema, posterior oropharyngeal erythema or tonsillar abscesses.       Gingivitis and caries  Eyes: Conjunctivae are normal. Pupils are equal, round, and reactive to light. No scleral icterus.  Neck: Normal range of motion. Neck supple.  Cardiovascular: Normal rate, regular rhythm, normal heart sounds and intact distal pulses.   No murmur heard. Pulmonary/Chest: Effort normal and breath sounds normal. No respiratory distress. He has no wheezes. He has no rales.  Lymphadenopathy:       Head (right side): No submental, no tonsillar, no preauricular, no posterior auricular and no occipital adenopathy present.       Head (left side): No submental, no submandibular, no tonsillar, no preauricular, no posterior auricular and no occipital adenopathy present.    He has no cervical adenopathy.    He has no axillary adenopathy.       Right: No supraclavicular adenopathy present.       Left: No supraclavicular adenopathy present.  Skin: Skin is warm and dry. No rash noted.       Spreading vitiligo on hands, neck, erythematous border  Psychiatric: He has a normal mood and affect.          Assessment & Plan:

## 2011-01-15 LAB — CBC WITH DIFFERENTIAL/PLATELET
Eosinophils Absolute: 0.3 10*3/uL (ref 0.0–0.7)
Eosinophils Relative: 3 % (ref 0–5)
Hemoglobin: 12.6 g/dL — ABNORMAL LOW (ref 13.0–17.0)
Lymphocytes Relative: 21 % (ref 12–46)
Lymphs Abs: 1.6 10*3/uL (ref 0.7–4.0)
MCH: 25.8 pg — ABNORMAL LOW (ref 26.0–34.0)
MCV: 77.5 fL — ABNORMAL LOW (ref 78.0–100.0)
Monocytes Relative: 7 % (ref 3–12)
Platelets: 277 10*3/uL (ref 150–400)
RBC: 4.88 MIL/uL (ref 4.22–5.81)
WBC: 7.5 10*3/uL (ref 4.0–10.5)

## 2011-01-15 LAB — COMPREHENSIVE METABOLIC PANEL
AST: 14 U/L (ref 0–37)
Albumin: 4.3 g/dL (ref 3.5–5.2)
Alkaline Phosphatase: 105 U/L (ref 39–117)
BUN: 14 mg/dL (ref 6–23)
Creat: 1.02 mg/dL (ref 0.50–1.35)
Glucose, Bld: 85 mg/dL (ref 70–99)
Total Bilirubin: 0.6 mg/dL (ref 0.3–1.2)

## 2011-01-16 DIAGNOSIS — R5383 Other fatigue: Secondary | ICD-10-CM | POA: Insufficient documentation

## 2011-01-16 DIAGNOSIS — K0889 Other specified disorders of teeth and supporting structures: Secondary | ICD-10-CM | POA: Insufficient documentation

## 2011-01-16 DIAGNOSIS — R5381 Other malaise: Secondary | ICD-10-CM | POA: Insufficient documentation

## 2011-01-16 NOTE — Assessment & Plan Note (Signed)
No obvious dental abscess, however concern for infection given pain and malaise and dental exam. Treat with amoxicillin course while gets in to see peridontist next month. Update [place] if not improving or any worsening.

## 2011-01-16 NOTE — Assessment & Plan Note (Signed)
Due for blood work so will check routine labs as well as revesible causes of fagitue.   Check testosterone as well.

## 2011-01-16 NOTE — Assessment & Plan Note (Signed)
Seems to have started progressing.  Treat with high potency steroid, discussed face precautions.   Trial for 2-3 wks daily use. If doesn't halt progression of vitiligo, refer to derm.

## 2011-01-17 LAB — TESTOSTERONE, FREE, TOTAL, SHBG
Sex Hormone Binding: 12 nmol/L — ABNORMAL LOW (ref 13–71)
Testosterone, Free: 57.1 pg/mL (ref 47.0–244.0)
Testosterone: 191.27 ng/dL — ABNORMAL LOW (ref 250–890)

## 2011-01-19 ENCOUNTER — Telehealth: Payer: Self-pay | Admitting: Family Medicine

## 2011-01-19 DIAGNOSIS — D649 Anemia, unspecified: Secondary | ICD-10-CM

## 2011-01-19 DIAGNOSIS — E349 Endocrine disorder, unspecified: Secondary | ICD-10-CM

## 2011-01-19 HISTORY — DX: Disorder of iron metabolism, unspecified: E83.10

## 2011-01-19 NOTE — Telephone Encounter (Signed)
iFOB order given to lab for patient tomorrow at lab appt.

## 2011-01-19 NOTE — Telephone Encounter (Signed)
Kidney, liver, thyroid, sugar normal. Blood count returning low with low MCV, concerning for iron deficiency.  Hasn't noticed any bleeding from anywhere.  Would like to start with stool kit and check ferritin, iron panel.  May need colonoscopy. Testosterone level low - consistent with testosterone deficiency, would like to recheck as well as LH to rule out secondary hypogonadism.  Asked pt to return at his convenience for labs in am, no fasting needed, to call to schedule appt.  Would like to send him home with iFOB.  Printed and routed to [employer].

## 2011-01-20 ENCOUNTER — Other Ambulatory Visit (INDEPENDENT_AMBULATORY_CARE_PROVIDER_SITE_OTHER): Payer: Managed Care, Other (non HMO) | Admitting: Family Medicine

## 2011-01-20 DIAGNOSIS — E291 Testicular hypofunction: Secondary | ICD-10-CM

## 2011-01-20 DIAGNOSIS — D649 Anemia, unspecified: Secondary | ICD-10-CM

## 2011-01-20 DIAGNOSIS — E349 Endocrine disorder, unspecified: Secondary | ICD-10-CM

## 2011-01-20 LAB — TESTOSTERONE: Testosterone: 216.02 ng/dL — ABNORMAL LOW (ref 350.00–890.00)

## 2011-01-20 LAB — IBC PANEL: Iron: 28 ug/dL — ABNORMAL LOW (ref 42–165)

## 2011-01-24 ENCOUNTER — Ambulatory Visit: Payer: Managed Care, Other (non HMO)

## 2011-01-24 DIAGNOSIS — D649 Anemia, unspecified: Secondary | ICD-10-CM

## 2011-01-24 LAB — FERRITIN: Ferritin: 318 ng/mL (ref 22–322)

## 2011-01-25 ENCOUNTER — Encounter: Payer: Self-pay | Admitting: Family Medicine

## 2011-01-27 ENCOUNTER — Encounter: Payer: Self-pay | Admitting: *Deleted

## 2011-01-27 ENCOUNTER — Ambulatory Visit (INDEPENDENT_AMBULATORY_CARE_PROVIDER_SITE_OTHER): Payer: Managed Care, Other (non HMO) | Admitting: Family Medicine

## 2011-01-27 ENCOUNTER — Ambulatory Visit: Payer: Managed Care, Other (non HMO) | Admitting: Family Medicine

## 2011-01-27 ENCOUNTER — Encounter: Payer: Self-pay | Admitting: Family Medicine

## 2011-01-27 ENCOUNTER — Ambulatory Visit (INDEPENDENT_AMBULATORY_CARE_PROVIDER_SITE_OTHER)
Admission: RE | Admit: 2011-01-27 | Discharge: 2011-01-27 | Disposition: A | Discharge status: Home / Self Care | Payer: Managed Care, Other (non HMO) | Source: Ambulatory Visit | Attending: Family Medicine | Admitting: Family Medicine

## 2011-01-27 VITALS — BP 112/80 | HR 76 | Temp 98.1°F | Wt 207.0 lb

## 2011-01-27 DIAGNOSIS — M545 Low back pain, unspecified: Secondary | ICD-10-CM

## 2011-01-27 DIAGNOSIS — D509 Iron deficiency anemia, unspecified: Secondary | ICD-10-CM

## 2011-01-27 DIAGNOSIS — R109 Unspecified abdominal pain: Secondary | ICD-10-CM

## 2011-01-27 DIAGNOSIS — E291 Testicular hypofunction: Secondary | ICD-10-CM

## 2011-01-27 DIAGNOSIS — R10A0 Flank pain, unspecified side: Secondary | ICD-10-CM

## 2011-01-27 DIAGNOSIS — E349 Endocrine disorder, unspecified: Secondary | ICD-10-CM

## 2011-01-27 LAB — POCT URINALYSIS DIPSTICK
Bilirubin, UA: NEGATIVE
Blood, UA: NEGATIVE
Ketones, UA: NEGATIVE
Nitrite, UA: NEGATIVE
pH, UA: 6.5

## 2011-01-27 NOTE — Patient Instructions (Signed)
Lower back xray today. Treat with flexeril and tramadol - stop anti inflammatories (ibuprofen). For low iron anemia - we will review stool kit and likely refer you to GI. Keep appointment at end of month. Update [place] if not improving as expected.

## 2011-01-27 NOTE — Progress Notes (Signed)
  Subjective:    Patient ID: Keith Bishop, male    DOB: 01-24-1971, 40 y.o.   MRN: [id number]  HPI CC: f/u low testosterone as well as anemia  Found to have microcytic anemia as well as low iron.  Denies blood loss.  Endorses good amt iron in diet, also endorses good vit C intake (orange juice).  Takes ibuprofen intermittently for last few months, recently more because back pain worsened (600mg  tid).  Appetite down.  Dropped off stool kit today.  Also endorsing more constipation than previously. Lab Results  Component Value Date   IRON 28* 01/20/2011   FERRITIN 318 01/24/2011   Lab Results  Component Value Date   WBC 7.5 01/14/2011   HGB 12.6* 01/14/2011   HCT 37.8* 01/14/2011   MCV 77.5* 01/14/2011   PLT 277 01/14/2011   Also found to have low testosterone with elevated LH, consistent with primary hypogonadism.  Today presents with back pain - going on for 3 months now, recently worsening.  Up to 8-9/10 pain.  Pretty constant, taking ibuprofen for this for last week.  Denies inciting fall, trauma to back.  Endorses night sweats.  Some pain in right leg, no numbness.  Now going into R flank.  H/o kidney stone in past, this is different.  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  Review of Systems Per HPI    Objective:   Physical Exam  Nursing note and vitals reviewed. Constitutional: He is oriented to person, place, and time. He appears well-developed and well-nourished. No distress.  HENT:  Head: Normocephalic and atraumatic.  Mouth/Throat: Oropharynx is clear and moist. No oropharyngeal exudate.  Eyes: Conjunctivae and EOM are normal. Pupils are equal, round, and reactive to light. No scleral icterus.  Neck: Normal range of motion. Neck supple.  Cardiovascular: Normal rate, regular rhythm, normal heart sounds and intact distal pulses.   No murmur heard. Pulmonary/Chest: Effort normal and breath sounds normal. No  respiratory distress. He has no wheezes. He has no rales.  Abdominal: Soft. Bowel sounds are normal. He exhibits no distension and no mass. There is tenderness (mild R abd). There is no rebound, no guarding and no CVA tenderness.  Musculoskeletal: He exhibits no edema.       Midline upper lumbar tenderness and paraspinous mm tenderness. Neg SLR bilaterally, no pain with int/ext rotation at hip.  Lymphadenopathy:    He has no cervical adenopathy.  Neurological: He is alert and oriented to person, place, and time.  Reflex Scores:      Bicep reflexes are 3+ on the right side and 3+ on the left side.      Patellar reflexes are 3+ on the right side and 3+ on the left side.      Achilles reflexes are 3+ on the right side and 3+ on the left side. Skin: Skin is warm and dry. No rash noted.  Psychiatric: He has a normal mood and affect.          Assessment & Plan:

## 2011-01-28 ENCOUNTER — Encounter: Payer: Self-pay | Admitting: Family Medicine

## 2011-01-28 ENCOUNTER — Encounter: Payer: Self-pay | Admitting: Gastroenterology

## 2011-01-28 DIAGNOSIS — M546 Pain in thoracic spine: Secondary | ICD-10-CM | POA: Insufficient documentation

## 2011-01-28 DIAGNOSIS — M549 Dorsalgia, unspecified: Secondary | ICD-10-CM | POA: Insufficient documentation

## 2011-01-28 DIAGNOSIS — G8929 Other chronic pain: Secondary | ICD-10-CM | POA: Insufficient documentation

## 2011-01-28 DIAGNOSIS — M545 Low back pain: Secondary | ICD-10-CM | POA: Insufficient documentation

## 2011-01-28 MED ORDER — CYCLOBENZAPRINE HCL 10 MG PO TABS: 10.0000 mg | ORAL_TABLET | Freq: Three times a day (TID) | ORAL | Status: DC | PRN

## 2011-01-28 MED ORDER — TRAMADOL HCL 50 MG PO TABS: 50.0000 mg | ORAL_TABLET | Freq: Two times a day (BID) | ORAL | Status: AC | PRN

## 2011-01-28 NOTE — Assessment & Plan Note (Addendum)
Recently deteriorated. sxs consistent with muscle strain.   Stop ibuprofen given may increase bleeding risk.   Treat with flexeril, tramadol.  Sedation precautions. Out of work for 2 days, return to work Saturday. UA WNL. Given duration, abd pain, and changes in BM, checked Xray today - WNL on my read. If unable to get in soon with GI or any worsening of abd/back pain, will set up with CT scan to eval sxs (back pain, changes in BMs, night sweats in setting of IDA and weight loss).  Currently no acute changes.

## 2011-01-28 NOTE — Assessment & Plan Note (Deleted)
Recently deteriorated. sxs consistent with muscle strain.   Stop ibuprofen given may increase bleeding risk.   Treat with flexeril, tramadol.  Sedation precautions. Out of work for 2 days, return to work Saturday. Given duration, abd pain, and changes in BM, checked Xray today - WNL on my read. Given endorsing new worsening back pain, changes in BMs, night sweats in setting of IDA and weight loss, will set up with CT scan of abdomen/pelvis to r/o mass etc.

## 2011-01-28 NOTE — Assessment & Plan Note (Addendum)
Await iFOB.  Will set up with GI for eval, consider colonoscopy to eval IDA in this previously health 40yo

## 2011-01-28 NOTE — Assessment & Plan Note (Signed)
Await resolution of back pain, eval for IDA prior to discussing testosterone supplementation.

## 2011-02-01 ENCOUNTER — Telehealth: Payer: Self-pay | Admitting: Family Medicine

## 2011-02-01 NOTE — Telephone Encounter (Signed)
Spoke with patient. He said his pain has improved some. He said it's nothing like it was before. He also thinks that it may work related and thinks that having the weekend off helped things. He has to work everyday this week, so he is going to pay close attention to things and see if anything he does causes it to flare back up.   His GI appointment is next week with [facility].

## 2011-02-01 NOTE — Telephone Encounter (Signed)
Can we call pt for update - if still having abd/back pain would like to set up with CT scan to eval. Has he heard from GI for referral?

## 2011-02-02 ENCOUNTER — Other Ambulatory Visit: Payer: Managed Care, Other (non HMO)

## 2011-02-03 ENCOUNTER — Other Ambulatory Visit: Payer: Self-pay | Admitting: Family Medicine

## 2011-02-03 DIAGNOSIS — D649 Anemia, unspecified: Secondary | ICD-10-CM

## 2011-02-03 DIAGNOSIS — R5381 Other malaise: Secondary | ICD-10-CM

## 2011-02-03 DIAGNOSIS — R5383 Other fatigue: Secondary | ICD-10-CM

## 2011-02-03 LAB — FECAL OCCULT BLOOD, IMMUNOCHEMICAL: Fecal Occult Bld: NEGATIVE

## 2011-02-07 ENCOUNTER — Encounter: Payer: Self-pay | Admitting: *Deleted

## 2011-02-07 NOTE — Telephone Encounter (Signed)
Noted. GI appt this week.

## 2011-02-08 ENCOUNTER — Other Ambulatory Visit (INDEPENDENT_AMBULATORY_CARE_PROVIDER_SITE_OTHER): Payer: Managed Care, Other (non HMO)

## 2011-02-08 ENCOUNTER — Encounter: Payer: Self-pay | Admitting: Gastroenterology

## 2011-02-08 ENCOUNTER — Ambulatory Visit (INDEPENDENT_AMBULATORY_CARE_PROVIDER_SITE_OTHER): Payer: Managed Care, Other (non HMO) | Admitting: Gastroenterology

## 2011-02-08 VITALS — BP 132/80 | HR 64 | Ht 76.0 in | Wt 207.0 lb

## 2011-02-08 DIAGNOSIS — R591 Generalized enlarged lymph nodes: Secondary | ICD-10-CM | POA: Insufficient documentation

## 2011-02-08 DIAGNOSIS — R599 Enlarged lymph nodes, unspecified: Secondary | ICD-10-CM

## 2011-02-08 DIAGNOSIS — R5383 Other fatigue: Secondary | ICD-10-CM

## 2011-02-08 DIAGNOSIS — E611 Iron deficiency: Secondary | ICD-10-CM

## 2011-02-08 DIAGNOSIS — R5381 Other malaise: Secondary | ICD-10-CM

## 2011-02-08 LAB — HIGH SENSITIVITY CRP: CRP, High Sensitivity: 77.91 mg/L — ABNORMAL HIGH (ref 0.000–5.000)

## 2011-02-08 MED ORDER — PEG-KCL-NACL-NASULF-NA ASC-C 100 G PO SOLR: 1.0000 | Freq: Once | ORAL | Status: DC

## 2011-02-08 NOTE — Patient Instructions (Signed)
Your procedure has been scheduled for 03/04/2011, please follow the seperate instructions.  Please go to the basement today for your labs.  Your prescription(s) have been sent to you pharmacy.

## 2011-02-08 NOTE — Progress Notes (Signed)
History of Present Illness:  This is a very nice and very interesting 40 year old Caucasian male referred by Dr. Eustaquio Boyden or evaluation of iron deficiency anemia.  This patient has a very interesting past history with diffuse lymphadenitis and lymphadenopathy evaluated by CT scans of the chest and abdomen several years ago showing enlarged lymph nodes in his neck, chest, and abdomen. 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 He apparently was not felt to have tuberculosis or sarcoidosis or lymphoma. In any case, this has not been reevaluated, and apparently he has a child who has a similar syndrome. He denies any current gastrointestinal, pulmonary, or systemic complaints except for mild fatigue. Recent lab data showed evidence of low iron saturation but elevated serum ferritin of 350 . He had a mild anemia with normal B12 and folate levels. The patient denies bowel irregularity, melena, hematochezia, dyspepsia, or any history of hepatobiliary problems. He has not had previous endoscopy or colonoscopy. He does have chronic low back pain and was taking up to 9 ibuprofen a day. He does have a positive family history of colon carcinoma in his grandfather.  Past Medical History  Diagnosis Date  . Vitiligo   . Personal history of tobacco use, presenting hazards to health   . Pure hyperglyceridemia   . Fatty liver disease, nonalcoholic   . Other malaise and fatigue   . Cat-scratch disease   . Benign neoplasm of skin of trunk, except scrotum   . Anemia    Past Surgical History  Procedure Date  . Lymphnode biopsy 11/06    Right supraclav area-negative  . Lymphnode biopsy 05/29/06    right cervical-negative  . Inguinal hernia repair 04/05/07    Left-Dr. Luisa Hart  . Mouth surgery 9/11    reports that he quit smoking about 19 months ago. He has never used smokeless tobacco. He reports that he does not drink alcohol or use illicit  drugs. family history includes Alcohol abuse in his maternal uncle; Diabetes in his mother and sister; Gestational diabetes in his sister; Heart attack (age of onset:45) in his father; Hypertension in his maternal grandmother, mother, and sister; Irritable bowel syndrome in his maternal grandmother; and Obesity in his sister.  There is no history of Colon cancer. No Known Allergies      I have reviewed this patient's present history, medical and surgical past history, allergies and medications.     ROS: Chronic fatigue well in the glands in his neck. The remainder of the 10 point ROS is negative     Physical Exam: General well developed well nourished patient in no acute distress, appearing his stated age Eyes PERRLA, no icterus, fundoscopic exam per opthamologist Skin no lesions noted Neck supple, palpable lymph nodes in the cervical chain and supraclavicular areas noted. I cannot appreciate lymphadenopathy in his groin areas. Chest clear to percussion and auscultation Heart no significant murmurs, gallops or rubs noted Abdomen no hepatosplenomegaly masses or tenderness, BS normal. . Extremities no acute joint lesions, edema, phlebitis or evidence of cellulitis. Neurologic patient oriented x 3, cranial nerves intact, no focal neurologic deficits noted. Psychological mental status normal and normal affect.  Assessment and plan: Review of his labs suggest anemia of chronic disease with difficulty of iron incorporation into his RBCs. Actually has now placed her ferritin level. I'm concerned this patient has an underlying systemic illness such as a B-cell lymphoma or some other chronic granulomatous inflammatory process such as sarcoidosis, chronic fungal infection,  occult tuberculosis, etc. We will proceed with endoscopy and colonoscopy, if these are unremarkable I recommend repeat CT scans of his abdomen and pelvis with possible repeat lymph node biopsies. I have ordered CRP and sed rate  today. I would not recommend institution of iron therapy at this time. Previous CT scan suggested fatty infiltration of the liver, but cannot appreciate hepatosplenomegaly at this time, and liver function tests are normal. The patient does have rather severe chronic fatigue, nonspecific nature.

## 2011-02-09 ENCOUNTER — Telehealth: Payer: Self-pay | Admitting: Family Medicine

## 2011-02-09 ENCOUNTER — Telehealth: Payer: Self-pay | Admitting: *Deleted

## 2011-02-09 DIAGNOSIS — R599 Enlarged lymph nodes, unspecified: Secondary | ICD-10-CM

## 2011-02-09 DIAGNOSIS — R5383 Other fatigue: Secondary | ICD-10-CM

## 2011-02-09 DIAGNOSIS — R61 Generalized hyperhidrosis: Secondary | ICD-10-CM

## 2011-02-09 NOTE — Telephone Encounter (Signed)
Message copied by Eustaquio Boyden on Wed Feb 09, 2011 12:52 PM ------      Message from: Jarold Motto, DAVID R      Created: Tue Feb 08, 2011  5:06 PM       This copy his primary care physician and/or a PA and lateral chest x-ray ASAP. Also have him come in for TB skin testing 3 at

## 2011-02-09 NOTE — Telephone Encounter (Signed)
Message copied by Florene Glen on Wed Feb 09, 2011  4:33 PM ------      Message from: Jarold Motto, DAVID R      Created: Tue Feb 08, 2011  5:06 PM       This copy his primary care physician and/or a PA and lateral chest x-ray ASAP. Also have him come in for TB skin testing 3 at

## 2011-02-09 NOTE — Telephone Encounter (Addendum)
ESR/CRP returned very elevated.  Will need to r/o TB. Can we call pt to see if he has been contacted by GI re TB test and CXR, if not would like him to come in at his convenience for CXR and PPD if not done recently.  (h/o bad periodontal disease, wonder if elevated inflammatory markers due to this, has f/u Sept)

## 2011-02-09 NOTE — Telephone Encounter (Signed)
Spoke with pt to inform him he needs a PA/Lateral chest XRAY ASAP. He also needs to come by for a TB skin test. Pt stated understanding and that he is going to Dr Sharen Hones' office for both.

## 2011-02-09 NOTE — Telephone Encounter (Signed)
Spoke with patient. He had not heard from GI as of right now. He is coming in to see you next week for a follow up and he will get CXR and PPD at that time.

## 2011-02-13 ENCOUNTER — Encounter: Payer: Self-pay | Admitting: Family Medicine

## 2011-02-14 ENCOUNTER — Encounter: Payer: Self-pay | Admitting: Family Medicine

## 2011-02-14 ENCOUNTER — Ambulatory Visit (INDEPENDENT_AMBULATORY_CARE_PROVIDER_SITE_OTHER)
Admission: RE | Admit: 2011-02-14 | Discharge: 2011-02-14 | Disposition: A | Discharge status: Home / Self Care | Payer: 59 | Source: Ambulatory Visit | Attending: Family Medicine | Admitting: Family Medicine

## 2011-02-14 ENCOUNTER — Ambulatory Visit (INDEPENDENT_AMBULATORY_CARE_PROVIDER_SITE_OTHER): Payer: 59 | Admitting: Family Medicine

## 2011-02-14 VITALS — BP 110/78 | HR 92 | Temp 98.3°F | Wt 205.0 lb

## 2011-02-14 DIAGNOSIS — Z111 Encounter for screening for respiratory tuberculosis: Secondary | ICD-10-CM

## 2011-02-14 DIAGNOSIS — R5381 Other malaise: Secondary | ICD-10-CM

## 2011-02-14 DIAGNOSIS — R5383 Other fatigue: Secondary | ICD-10-CM

## 2011-02-14 DIAGNOSIS — R61 Generalized hyperhidrosis: Secondary | ICD-10-CM

## 2011-02-14 DIAGNOSIS — R599 Enlarged lymph nodes, unspecified: Secondary | ICD-10-CM

## 2011-02-14 DIAGNOSIS — M545 Low back pain, unspecified: Secondary | ICD-10-CM

## 2011-02-14 DIAGNOSIS — Z8719 Personal history of other diseases of the digestive system: Secondary | ICD-10-CM

## 2011-02-14 DIAGNOSIS — E781 Pure hyperglyceridemia: Secondary | ICD-10-CM

## 2011-02-14 DIAGNOSIS — D509 Iron deficiency anemia, unspecified: Secondary | ICD-10-CM

## 2011-02-14 DIAGNOSIS — E349 Endocrine disorder, unspecified: Secondary | ICD-10-CM

## 2011-02-14 DIAGNOSIS — E291 Testicular hypofunction: Secondary | ICD-10-CM

## 2011-02-14 NOTE — Progress Notes (Signed)
  Subjective:    Patient ID: Keith Bishop, male    DOB: [birth year], 40 y.o.   MRN: [id number]  HPI CC: 3wk f/u.  Continued night sweats.  Has not seen heme in past.  Appetite down.  Fatigue continues.  No fevers/chills.  Reviewing old records, has had localized LAD cervical/supraclavicular region since 1997, s/p 2-3 LN biopsies all negative for lymphoma and showing necrotizing granulomatous inflammation, no infectious cause found either.  Last CT scan 2007 showing progression of mediastinal LAD.  Wt Readings from Last 3 Encounters:  02/14/11 205 lb (92.987 kg)  02/08/11 207 lb (93.895 kg)  01/27/11 207 lb (93.895 kg)   Last had PPD 2010, normal.  Last had chest xray.  Significant periodontal disease.  Had appt today but they cancelled because of concern for TB.  Previously placed on amoxicillin which helped night sweats.  Back pain improved but worse when on feet for prolonged periods of time or when trying to sleep.  Flexeril didn't help.  Tylenol hasn't helped as much as ibuprofen.  Continued abdominal pain, worse on left side, worse pain when working.  Periodontist - Dr. Corliss Marcus Dentist - Dr. Kathrine Cords  Review of Systems Per HPI    Objective:   Physical Exam  Nursing note and vitals reviewed. Constitutional: He is oriented to person, place, and time. He appears well-developed and well-nourished. No distress.  HENT:  Head: Normocephalic and atraumatic.  Mouth/Throat: Oropharynx is clear and moist. No oropharyngeal exudate.  Eyes: Conjunctivae and EOM are normal. Pupils are equal, round, and reactive to light. No scleral icterus.  Neck: Normal range of motion. Neck supple.  Cardiovascular: Normal rate, regular rhythm, normal heart sounds and intact distal pulses.   No murmur heard. Pulmonary/Chest: Effort normal and breath sounds normal. No respiratory distress. He has no wheezes. He has no rales.  Abdominal: Soft. Bowel sounds are normal. He exhibits no distension and no  mass. There is no tenderness. There is no rebound, no guarding and no CVA tenderness.  Musculoskeletal: He exhibits no edema.  Lymphadenopathy:       Head (right side): No submental, no submandibular, no tonsillar, no preauricular, no posterior auricular and no occipital adenopathy present.       Head (left side): No submental, no submandibular, no tonsillar, no preauricular, no posterior auricular and no occipital adenopathy present.    He has cervical adenopathy.       Right cervical: No superficial cervical, no deep cervical and no posterior cervical adenopathy present.      Left cervical: Posterior cervical adenopathy present. No superficial cervical and no deep cervical adenopathy present.    He has no axillary adenopathy.       Right: No inguinal adenopathy present.       Left: Inguinal adenopathy present.       nontender LAD.  Neurological: He is alert and oriented to person, place, and time.  Skin: Skin is warm and dry. No rash noted. He is not diaphoretic.  Psychiatric: He has a normal mood and affect.          Assessment & Plan:

## 2011-02-14 NOTE — Patient Instructions (Addendum)
Return at your convenience fasting for blood work.  We will check cholesterol levels then. Return Wednesday when you can (prior to 5pm) to have PPD read.  If normal, may reschedule peridontist appointment. Check with peridontist about antibiotics around dental procedure. Xray looking ok today. Good to see you today, call us with questions. Come back in 1-2 months, sooner if needed.

## 2011-02-15 ENCOUNTER — Ambulatory Visit: Payer: Self-pay | Admitting: Family Medicine

## 2011-02-15 NOTE — Assessment & Plan Note (Addendum)
Awaiting colonoscopy, workup of LAD, fatigue prior to beginning testosterone replacement, but anticipate will def help with fatigue.

## 2011-02-15 NOTE — Assessment & Plan Note (Signed)
Not as active as in past. Attributing to work

## 2011-02-15 NOTE — Progress Notes (Signed)
Addended by: Eustaquio Boyden on: 02/15/2011 09:16 AM   Modules accepted: Orders

## 2011-02-15 NOTE — Assessment & Plan Note (Signed)
Colonoscopy scheduled 02/2011.  iFOB neg.

## 2011-02-15 NOTE — Assessment & Plan Note (Addendum)
LN biopsies x 2 in past unrevealing for cause of LAD. ?inflammatory pseudotumor vs atypical mycobacterium infection vs due to periodontal disease as chronic h/o this (more likely). Placed PPD today.  CXR unrevealing. Negative HIV and RPR in 2001.  Not high risk. Has not had hepatitis screen. Consider referral to heme vs rpt chest/abd/pelvic CT although pt requests to defer until after colonoscopy.

## 2011-02-15 NOTE — Assessment & Plan Note (Addendum)
Due to check FLP.  Return fasting for this.   LFTs WNL on last check 12/2010. Discussed etiology of fatty liver.

## 2011-02-15 NOTE — Assessment & Plan Note (Signed)
Recheck FLP

## 2011-02-16 ENCOUNTER — Ambulatory Visit (INDEPENDENT_AMBULATORY_CARE_PROVIDER_SITE_OTHER): Payer: 59 | Admitting: Family Medicine

## 2011-02-16 DIAGNOSIS — Z111 Encounter for screening for respiratory tuberculosis: Secondary | ICD-10-CM

## 2011-02-16 LAB — TB SKIN TEST: Induration: 0

## 2011-02-16 NOTE — Progress Notes (Signed)
Patient in for PPD read. No induration. Negative test.

## 2011-02-17 ENCOUNTER — Telehealth: Payer: Self-pay | Admitting: Family Medicine

## 2011-02-17 NOTE — Telephone Encounter (Signed)
Letter prepared and routed to [facility].

## 2011-02-17 NOTE — Telephone Encounter (Signed)
Letter faxed to periodontist. Letter mailed to patient for his records if necessary.

## 2011-02-26 HISTORY — PX: OTHER SURGICAL HISTORY: SHX169

## 2011-03-04 ENCOUNTER — Ambulatory Visit (AMBULATORY_SURGERY_CENTER): Payer: 59 | Admitting: Gastroenterology

## 2011-03-04 ENCOUNTER — Encounter: Payer: Self-pay | Admitting: Gastroenterology

## 2011-03-04 DIAGNOSIS — D509 Iron deficiency anemia, unspecified: Secondary | ICD-10-CM

## 2011-03-04 DIAGNOSIS — D126 Benign neoplasm of colon, unspecified: Secondary | ICD-10-CM

## 2011-03-04 DIAGNOSIS — R5381 Other malaise: Secondary | ICD-10-CM

## 2011-03-04 DIAGNOSIS — E611 Iron deficiency: Secondary | ICD-10-CM

## 2011-03-04 DIAGNOSIS — D649 Anemia, unspecified: Secondary | ICD-10-CM

## 2011-03-04 DIAGNOSIS — R5383 Other fatigue: Secondary | ICD-10-CM

## 2011-03-04 DIAGNOSIS — K2940 Chronic atrophic gastritis without bleeding: Secondary | ICD-10-CM

## 2011-03-04 DIAGNOSIS — Z8719 Personal history of other diseases of the digestive system: Secondary | ICD-10-CM

## 2011-03-04 MED ORDER — SODIUM CHLORIDE 0.9 % IV SOLN: 500.0000 mL | INTRAVENOUS | Status: DC

## 2011-03-04 NOTE — Patient Instructions (Signed)
RESUME ALL MEDICATIONS. INFORMATION GIVEN ON POLYPS. D/C INSTRUCTIONS COMPLETED.

## 2011-03-07 ENCOUNTER — Telehealth: Payer: Self-pay

## 2011-03-07 DIAGNOSIS — D649 Anemia, unspecified: Secondary | ICD-10-CM

## 2011-03-07 DIAGNOSIS — R109 Unspecified abdominal pain: Secondary | ICD-10-CM

## 2011-03-07 LAB — HELICOBACTER PYLORI SCREEN-BIOPSY: UREASE: NEGATIVE

## 2011-03-07 NOTE — Telephone Encounter (Signed)

## 2011-03-08 ENCOUNTER — Encounter: Payer: Self-pay | Admitting: Gastroenterology

## 2011-03-09 ENCOUNTER — Encounter: Payer: Self-pay | Admitting: Gastroenterology

## 2011-03-09 ENCOUNTER — Encounter: Payer: Self-pay | Admitting: Family Medicine

## 2011-03-11 ENCOUNTER — Ambulatory Visit (INDEPENDENT_AMBULATORY_CARE_PROVIDER_SITE_OTHER): Payer: 59 | Admitting: Family Medicine

## 2011-03-11 ENCOUNTER — Encounter: Payer: Self-pay | Admitting: Family Medicine

## 2011-03-11 VITALS — BP 130/82 | HR 76 | Temp 97.7°F | Wt 206.8 lb

## 2011-03-11 DIAGNOSIS — R5381 Other malaise: Secondary | ICD-10-CM

## 2011-03-11 DIAGNOSIS — E611 Iron deficiency: Secondary | ICD-10-CM

## 2011-03-11 DIAGNOSIS — R5383 Other fatigue: Secondary | ICD-10-CM

## 2011-03-11 DIAGNOSIS — R599 Enlarged lymph nodes, unspecified: Secondary | ICD-10-CM

## 2011-03-11 DIAGNOSIS — E781 Pure hyperglyceridemia: Secondary | ICD-10-CM

## 2011-03-11 DIAGNOSIS — Z8719 Personal history of other diseases of the digestive system: Secondary | ICD-10-CM

## 2011-03-11 DIAGNOSIS — E291 Testicular hypofunction: Secondary | ICD-10-CM

## 2011-03-11 NOTE — Progress Notes (Signed)
  Subjective:    Patient ID: Keith Bishop, male    DOB: 01-04-1971, 40 y.o.   MRN: [id number]  HPI CC: f/u colonoscopy  Had endoscopy and colonoscopy, Reviewed results with patient.  Found 1 small rectal adenomatous polyp, rec rpt 5 years.  No etiology of anemia.  EGD showing chronic focal gastritis.  Pt states has cut back on NSAIDs, doesn't drink EtOH.  Not on ppi. To see dentist on Wednesday.  peridontist cancelled appt due to TB scare but negative PPD, normal CXR.  Has significant periodontal disease, appt with periodontist 03/2011.  Still with night sweats, still with some back pain.  Right side pain resolved.  Wt Readings from Last 3 Encounters:  03/11/11 206 lb 12 oz (93.781 kg)  03/04/11 207 lb (93.895 kg)  02/14/11 205 lb (92.987 kg)   Low testosterone - found to have low testosterone, elevated LH.  No family history of low testosterone.  Doesn't know dad or father's side.  Only knows he passed away in 40s 2/2 CAD.  Pt does endorse fatigue, some depressed mood, and decreased sex drive.  Would like to wait for CT scan results prior to pursuing testosterone replacement.  Hesitant about lifelong testosterone replacement.  Review of Systems Per HPI    Objective:   Physical Exam  Nursing note and vitals reviewed. Constitutional: He is oriented to person, place, and time. He appears well-developed and well-nourished. No distress.  HENT:  Head: Normocephalic and atraumatic.  Mouth/Throat: Uvula is midline, oropharynx is clear and moist and mucous membranes are normal. Abnormal dentition. Dental caries present. No oropharyngeal exudate.  Eyes: Conjunctivae and EOM are normal. Pupils are equal, round, and reactive to light. No scleral icterus.  Neck: Normal range of motion. Neck supple. No thyromegaly present.  Cardiovascular: Normal rate, regular rhythm, normal heart sounds and intact distal pulses.   No murmur heard. Pulmonary/Chest: Effort normal and breath sounds normal. No  respiratory distress. He has no wheezes. He has no rales.  Abdominal: Soft. Bowel sounds are normal. He exhibits no distension and no mass. There is no tenderness. There is no rebound, no guarding and no CVA tenderness.  Musculoskeletal: He exhibits no edema.  Lymphadenopathy:       Head (right side): No submental, no submandibular, no tonsillar, no preauricular, no posterior auricular and no occipital adenopathy present.       Head (left side): No submental, no submandibular, no tonsillar, no preauricular, no posterior auricular and no occipital adenopathy present.    He has cervical adenopathy.       Right cervical: No superficial cervical, no deep cervical and no posterior cervical adenopathy present.      Left cervical: Posterior cervical adenopathy present. No superficial cervical and no deep cervical adenopathy present.    He has no axillary adenopathy.       Right: No supraclavicular adenopathy present.       Left: Supraclavicular adenopathy present.       nontender LAD.  Neurological: He is alert and oriented to person, place, and time.  Skin: Skin is warm and dry. No rash noted. He is not diaphoretic.       Vitiligo present  Psychiatric: He has a normal mood and affect.          Assessment & Plan:

## 2011-03-11 NOTE — Patient Instructions (Addendum)
Return Wednesday morning fasting for blood work. We will set you up with abdominal, pelvic and chest CT to follow swollen glands. Return at your convenience to do prostate exam and discuss starting testosterone supplementation. Good to see you today, call us with questions.

## 2011-03-12 ENCOUNTER — Encounter: Payer: Self-pay | Admitting: Family Medicine

## 2011-03-12 DIAGNOSIS — E291 Testicular hypofunction: Secondary | ICD-10-CM | POA: Insufficient documentation

## 2011-03-12 NOTE — Assessment & Plan Note (Signed)
Low T, high LH. Discussed replacement as well as need to monitor prostate (DRE and PSA prior to treatment). Pt opts to wait to see if some fatigue/libido improves after upcoming dental work in next few months.

## 2011-03-12 NOTE — Assessment & Plan Note (Signed)
FLP when returns fasting.

## 2011-03-12 NOTE — Assessment & Plan Note (Signed)
Lab Results  Component Value Date   IRON 28* 01/20/2011   FERRITIN 318 01/24/2011  ?iron def.  Recheck CBC, retic count. May do trial of iron.

## 2011-03-12 NOTE — Assessment & Plan Note (Addendum)
Lymphnode biopsy Date: 06/12/1996 Right supraclav area-negative  Lymphnode biopsy Date: 05/29/06 right cervical-negative (necrotizing granulomatous inflammation) LN biopsies x 2 in past unrevealing for cause of LAD. CT chest/abd Date: 03/2006 progression of LAD in sup mediastinum, persistent lymphadenopathy in neck/R supraclav, splenomegaly ?inflammatory pseudotumor vs atypical mycobacterium infection vs due to periodontal disease as chronic h/o this (more likely). CXR WNL, PPD negative last month.  Negative HIV and RPR in 2001.  Not high risk. Has not had hepatitis screen. Will rpt chest/abd/pelvic CT to monitor progression of LAD.  If stable, will await dental work to see if improves LAD.  If not, consider heme referral vs rpt biopsy. Does have daughter with similar swelling of LN.

## 2011-03-12 NOTE — Assessment & Plan Note (Signed)
FLP when returns fasting.  LFTs within normal limits.

## 2011-03-12 NOTE — Assessment & Plan Note (Signed)
?   Due to testosterone deficiency, but pt opts to wait prior to replacement to see if improved with upcoming dental work first.

## 2011-03-16 ENCOUNTER — Other Ambulatory Visit: Payer: 59

## 2011-03-16 ENCOUNTER — Telehealth: Payer: Self-pay | Admitting: *Deleted

## 2011-03-16 DIAGNOSIS — R599 Enlarged lymph nodes, unspecified: Secondary | ICD-10-CM

## 2011-03-16 NOTE — Telephone Encounter (Signed)
Radiologist called to report that pt had a very abnormal CT, suspicious for lymphoma, she will fax report.

## 2011-03-17 ENCOUNTER — Encounter: Payer: Self-pay | Admitting: *Deleted

## 2011-03-17 NOTE — Telephone Encounter (Signed)
Discussed with patient - will attempt to contact IR for biopsy of lesion in retroperitoneal area. Asked him to bring in copy of scan -DVD.

## 2011-03-17 NOTE — Telephone Encounter (Signed)
Result in in your IN box.

## 2011-03-17 NOTE — Telephone Encounter (Signed)
Still have not received copy of report.  Please check up front for fax. Also, where did pt go for CT?  I preferred at [facility] so it could be compared to CT done 2007.

## 2011-03-18 ENCOUNTER — Ambulatory Visit: Payer: 59 | Admitting: Family Medicine

## 2011-03-18 NOTE — Telephone Encounter (Signed)
Spoke with Dr. Rica Records IR at [facility], he would like to see scans to eval where best to get tissue sample.  Will drop off scan when arrives.

## 2011-03-22 ENCOUNTER — Encounter: Payer: Self-pay | Admitting: Gastroenterology

## 2011-03-22 ENCOUNTER — Encounter: Payer: Self-pay | Admitting: Family Medicine

## 2011-03-22 NOTE — Telephone Encounter (Signed)
Discussed with pt re plan.  He is hesitant to have neck biopsy as has had 3+ in past, all inconclusive.  Prefers to have abd biopsy, discussed risk of bleed given location.  Recommended discuss this with IR when biopsy scheduled.  Will schedule rpt imaging studies.

## 2011-03-22 NOTE — Telephone Encounter (Signed)
Discussed with Dr. Gwenith Daily.  Concern for lymphoma.  He recommends CT neck and then biopsy of abd/neck area at [facility].  Placed orders.  Have not had chance to call pt to discuss.

## 2011-03-25 ENCOUNTER — Telehealth: Payer: Self-pay | Admitting: Family Medicine

## 2011-03-25 ENCOUNTER — Encounter: Payer: Self-pay | Admitting: Family Medicine

## 2011-03-25 DIAGNOSIS — R599 Enlarged lymph nodes, unspecified: Secondary | ICD-10-CM

## 2011-03-25 NOTE — Telephone Encounter (Signed)
Changed neck CT order

## 2011-03-28 HISTORY — PX: OTHER SURGICAL HISTORY: SHX169

## 2011-03-28 NOTE — Telephone Encounter (Signed)
Pt called to ask if his appendix was seen in the abd CT and whether you think he could have an infected appendix that could be the reason for his swollen lymph nodes, since he has been having the abd pain.

## 2011-03-28 NOTE — Telephone Encounter (Signed)
Please notify appendix normal.

## 2011-03-29 NOTE — Telephone Encounter (Signed)
Patient notified

## 2011-04-04 ENCOUNTER — Other Ambulatory Visit: Payer: Self-pay | Admitting: Interventional Radiology

## 2011-04-04 ENCOUNTER — Ambulatory Visit (HOSPITAL_COMMUNITY)
Admission: RE | Admit: 2011-04-04 | Discharge: 2011-04-04 | Disposition: A | Discharge status: Home / Self Care | Payer: 59 | Source: Ambulatory Visit | Attending: Family Medicine | Admitting: Family Medicine

## 2011-04-04 ENCOUNTER — Encounter: Payer: Self-pay | Admitting: Family Medicine

## 2011-04-04 ENCOUNTER — Other Ambulatory Visit (HOSPITAL_COMMUNITY): Payer: 59

## 2011-04-04 DIAGNOSIS — R599 Enlarged lymph nodes, unspecified: Secondary | ICD-10-CM | POA: Insufficient documentation

## 2011-04-04 LAB — CBC
HCT: 38 % — ABNORMAL LOW (ref 39.0–52.0)
Hemoglobin: 13.2 g/dL (ref 13.0–17.0)
RDW: 13.9 % (ref 11.5–15.5)
WBC: 7.1 10*3/uL (ref 4.0–10.5)

## 2011-04-04 LAB — PROTIME-INR
INR: 0.99 (ref 0.00–1.49)
Prothrombin Time: 13.3 seconds (ref 11.6–15.2)

## 2011-04-04 LAB — APTT: aPTT: 34 seconds (ref 24–37)

## 2011-04-07 LAB — BASIC METABOLIC PANEL
BUN: 11
Calcium: 9.3
GFR calc non Af Amer: >60
Potassium: 4.1
Sodium: 141

## 2011-04-07 LAB — CBC
HCT: 45
Platelets: 191
WBC: 4.9

## 2011-04-07 LAB — DIFFERENTIAL
Lymphocytes Relative: 27
Lymphs Abs: 1.3
Neutro Abs: 3.1
Neutrophils Relative %: 63

## 2011-04-13 ENCOUNTER — Ambulatory Visit (INDEPENDENT_AMBULATORY_CARE_PROVIDER_SITE_OTHER): Payer: 59 | Admitting: Family Medicine

## 2011-04-13 ENCOUNTER — Encounter: Payer: Self-pay | Admitting: Family Medicine

## 2011-04-13 VITALS — BP 118/78 | HR 72 | Temp 98.4°F | Wt 208.0 lb

## 2011-04-13 DIAGNOSIS — N5089 Other specified disorders of the male genital organs: Secondary | ICD-10-CM

## 2011-04-13 DIAGNOSIS — R599 Enlarged lymph nodes, unspecified: Secondary | ICD-10-CM

## 2011-04-13 LAB — LACTATE DEHYDROGENASE: LDH: 117 U/L (ref 94–250)

## 2011-04-13 NOTE — Progress Notes (Signed)
Subjective:    Patient ID: Keith Bishop, male    DOB: [birth year], 40 y.o.   MRN: [id number]  HPI CC: f/u LAD  Presents today as no charge visit for f/u US neck biopsy.  Overall feeling well.  Several year history of unexplained LAD.  States had bone marrow biopsy at age 82yo, negative.  daughter with similar LN swelling, denies fmhx lymphoma or leukemia.  Doesn't know father's side.  Lymphnode biopsy Date: 06/12/1996 Right supraclav area-negative  Lymphnode biopsy Date: 05/29/06 right cervical-negative (necrotizing granulomatous inflammation)  LN biopsies x 2 in past unrevealing for cause of LAD.  CT chest/abd Date: 03/2006 progression of LAD in sup mediastinum, persistent lymphadenopathy in neck/R supraclav, splenomegaly  CT neck/chest/abd/pelvis Date: 02/2011 progression of LAD in chest, retroperitoneal area mediastinal, perihilar, extensive upper abdominal and retroperitoneal LAD with splenomegaly. [place] core biopsy of left supraclavicular LN Date: 03/2011 - lymphoid tissue, QNS for flow cytometry  I spoke with IR, onc and surgery.  They do recommend tissue collection to r/o lymphoma.  No fevers/chills, no appetite changes.  No more night sweats.  No more weight loss. Wt Readings from Last 3 Encounters:  04/13/11 208 lb (94.348 kg)  03/11/11 206 lb 12 oz (93.781 kg)  03/04/11 207 lb (93.895 kg)   Has seen periodontist and dentist.  Has had teeth cleaned.  Has 2 abscessed teeth.  Planning right upper root canal and left lower tooth removal.  Swelling in scrotum - has noted for last several months mild discomfort in scrotum on left side, did note some swelling there recently.  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  Pt may need to take time off from work given all these recent appts and doctor visits.  Advised reasonable, to bring FMLA paperwork if needed.  Review of Systems Per HPI    Objective:   Physical Exam  Nursing note and vitals reviewed. Constitutional: He  appears well-developed and well-nourished. No distress.  HENT:  Head: Normocephalic and atraumatic.       Poor dentition  Eyes: Conjunctivae and EOM are normal. Pupils are equal, round, and reactive to light. No scleral icterus.  Neck: Normal range of motion. Neck supple.  Cardiovascular: Normal rate, regular rhythm, normal heart sounds and intact distal pulses.   No murmur heard. Pulmonary/Chest: Effort normal and breath sounds normal. No respiratory distress. He has no wheezes. He has no rales.  Abdominal: Hernia confirmed negative in the right inguinal area and confirmed negative in the left inguinal area.  Genitourinary: Penis normal. Right testis shows no mass, no swelling and no tenderness. Right testis is descended. Left testis shows swelling. Left testis shows no mass and no tenderness. Left testis is descended. Uncircumcised.       R testicular swelling, nontender, no induration or masses appreciated. R epididymis enlarged, spermatic cord with several knots going up into inguinal canal.  Musculoskeletal: He exhibits no edema.  Lymphadenopathy:       Head (right side): No submental, no submandibular, no tonsillar, no preauricular, no posterior auricular and no occipital adenopathy present.       Head (left side): No submental, no submandibular, no tonsillar, no preauricular, no posterior auricular and no occipital adenopathy present.    He has no cervical adenopathy.       Right cervical: No superficial cervical and no posterior cervical adenopathy present.      Left cervical: No superficial cervical and no posterior cervical adenopathy present.    He has no axillary adenopathy.  Right: No inguinal, no supraclavicular and no epitrochlear adenopathy present.       Left: Supraclavicular adenopathy present. No inguinal and no epitrochlear adenopathy present.  Skin: Skin is warm and dry. No rash noted.  Psychiatric: He has a normal mood and affect.      Assessment & Plan:

## 2011-04-13 NOTE — Assessment & Plan Note (Addendum)
First obtain scrotal US.  If normal, recommend make appt with surgery to attempt further tissue sampling. Check HIV, periph smear and LDH today.

## 2011-04-13 NOTE — Patient Instructions (Addendum)
Blood work today. I'd like to set you up with scrotal ultrasound - pass by Marion's office to set up. I do want you to keep appointment with surgery. Letter provided today.

## 2011-04-13 NOTE — Assessment & Plan Note (Addendum)
Set up with scrotal US to eval abnormal left side.   Not consistent with hydrocele or spermatocele.   Given recent findings, concern for other.  Will call with results.

## 2011-04-14 LAB — HIV ANTIBODY (ROUTINE TESTING W REFLEX): HIV: NONREACTIVE

## 2011-04-15 ENCOUNTER — Ambulatory Visit
Admission: RE | Admit: 2011-04-15 | Discharge: 2011-04-15 | Disposition: A | Discharge status: Home / Self Care | Payer: 59 | Source: Ambulatory Visit | Attending: Family Medicine | Admitting: Family Medicine

## 2011-04-15 ENCOUNTER — Telehealth: Payer: Self-pay | Admitting: Family Medicine

## 2011-04-15 DIAGNOSIS — N5089 Other specified disorders of the male genital organs: Secondary | ICD-10-CM

## 2011-04-15 NOTE — Telephone Encounter (Signed)
Spoke with pt regarding ultrasound findings. Testes normal. Left possible epididymitis.  Given sxs, doubt infectious so will not start abx. Also bilateral complex hydroceles. Will touch base with urology regarding findings.

## 2011-04-22 NOTE — Telephone Encounter (Signed)
Page placed to urology this afternoon.  Will try again next week.

## 2011-04-25 NOTE — Telephone Encounter (Signed)
Spoke with Dr. Mena Goes, [facility], discussed pt case as well as [place] results.  He is not too impressed with complex hydroceles, suggests routine referral to urology for eval.  Will await surgical eval and then set up with urology. Called [facility] - left message.  Would want to discuss referral down road to urology for complex hydrocele as well as for hypogonadism.

## 2011-04-27 NOTE — Telephone Encounter (Signed)
Spoke with Yaser - will await surgery eval then refer to urology for further evaluation.

## 2011-04-29 ENCOUNTER — Encounter (INDEPENDENT_AMBULATORY_CARE_PROVIDER_SITE_OTHER): Payer: Self-pay | Admitting: Surgery

## 2011-04-29 ENCOUNTER — Ambulatory Visit (INDEPENDENT_AMBULATORY_CARE_PROVIDER_SITE_OTHER): Payer: 59 | Admitting: Surgery

## 2011-04-29 VITALS — BP 118/86 | HR 64 | Temp 97.2°F | Resp 16 | Ht 76.0 in | Wt 205.0 lb

## 2011-04-29 DIAGNOSIS — R599 Enlarged lymph nodes, unspecified: Secondary | ICD-10-CM

## 2011-04-29 DIAGNOSIS — R591 Generalized enlarged lymph nodes: Secondary | ICD-10-CM

## 2011-04-29 NOTE — Progress Notes (Signed)
Chief Complaint  Patient presents with  . Other    Eval for lymph node bx    HPI Keith Bishop is a 40 y.o. male.  HPI The patient presents today at the request of Dr. Patrice Paradise due to lymphadenopathy. I last saw him in 2007 due to cervical lymphadenopathy. Biopsies were done which showed reactive changes. He has a history of lymphadenopathy since age 26. He has had other lymph node biopsies in the past prior to this that were reactive. No lymphoma or other malignant process has been identified. He has lost weight which she has gained back over the summer. He works outside. He states he has night sweats but he does have significant dental problems and dental infection. He has regained his weight he lost. His appetite is good. He denies any abdominal pain. He denies nausea or vomiting. He feels well overall.  Past Medical History  Diagnosis Date  . Vitiligo   . Personal history of tobacco use, presenting hazards to health   . Pure hyperglyceridemia   . Fatty liver disease, nonalcoholic   . Other malaise and fatigue   . Benign neoplasm of skin of trunk, except scrotum   . Anemia   . Lymphadenopathy     s/p cervical/supraclavicular biopsy 1997 and again 2006 and 2007, necrotizing granulomatous inflammation, possibly thought to be due to periodontal disease  . Primary male hypogonadism     Past Surgical History  Procedure Date  . Lymphnode biopsy 06/12/1996    Right supraclav area-negative   . Lymphnode biopsy 03/04/05, 05/29/06    right cervical-negative (necrotizing granulomatous inflammation)  . Inguinal hernia repair 04/05/07    Left-Dr. Luisa Hart  . Mouth surgery 9/11  . Ct chest/abd 03/2006    progression of LAD in sup mediastinum, persistent lymphadenopathy in neck/R supraclav, splenomegaly  . Colonoscopy/endoscopy 02/2011    overall normal - focal mild chronic gastritis, rectal serrated adenoma polyp rpt 5 yrs  . Ct chest/abd/pelvis 02/2011    moderate thoracic inlet, mild  mediastinal LAD, extensive upper abd and retroperitoneal LAD, splenomegaly.  consider lymphoma    Family History  Problem Relation Age of Onset  . Heart attack Father 85    deceased  . Hypertension Mother   . Diabetes Mother   . Hypertension Maternal Grandmother   . Irritable bowel syndrome Maternal Grandmother   . Hypertension Sister   . Diabetes Sister   . Obesity Sister   . Alcohol abuse Maternal Uncle   . Gestational diabetes Sister   . Colon cancer Neg Hx   . Cancer Maternal Grandfather     colon    Social History History  Substance Use Topics  . Smoking status: Former Smoker    Quit date: 04/28/2009  . Smokeless tobacco: Never Used  . Alcohol Use: No    No Known Allergies  Current Outpatient Prescriptions  Medication Sig Dispense Refill  . acetaminophen (TYLENOL) 500 MG tablet Take 500 mg by mouth every 6 (six) hours as needed.        . doxycycline (VIBRA-TABS) 100 MG tablet         Review of Systems Review of Systems  Constitutional: Positive for fatigue. Negative for fever, chills and unexpected weight change.  HENT: Negative for nosebleeds, facial swelling and neck pain.   Eyes: Negative.   Respiratory: Negative.   Cardiovascular: Negative.   Gastrointestinal: Negative.   Genitourinary: Positive for testicular pain.  Musculoskeletal: Negative.   Neurological: Negative.   Hematological: Positive for adenopathy.  Does not bruise/bleed easily.  Psychiatric/Behavioral: Negative.     Blood pressure 118/86, pulse 64, temperature 97.2 F (36.2 C), temperature source Temporal, resp. rate 16, height 6\' 4"  (1.93 m), weight 205 lb (92.987 kg).  Physical Exam Physical Exam  Constitutional: He is oriented to person, place, and time. He appears well-developed and well-nourished.  HENT:  Head: Normocephalic and atraumatic.  Eyes: EOM are normal. Pupils are equal, round, and reactive to light.  Neck:       Left supraclavicular node is 3 cm at base of neck level  4   Cardiovascular: Normal rate and regular rhythm.   Pulmonary/Chest: Effort normal and breath sounds normal.  Abdominal: Soft. He exhibits no distension and no mass. There is no tenderness.  Musculoskeletal: Normal range of motion.  Lymphadenopathy:    He has cervical adenopathy.  Neurological: He is alert and oriented to person, place, and time.  Skin: Skin is warm and dry.  Psychiatric: He has a normal mood and affect. His behavior is normal. Judgment and thought content normal.    Data Reviewed CT CHEST ABDOMEN PELVIS:  SIGNIFICANT UPPER ABDOMINAL LYMPHADENOPATHY ANF RETROPERITONEAL ADENOPATHY.  CHEST ADENOPATHY NOTED.  ENLARGED LEFT NECK LYMPH NODE LEVEL 4 AT BASE OF NECK. CORE BIOPSY LEFT NECK NODE INCONCLUSIVE.    Assessment  Cervical,  Thoracic and abdominal lymphadenopathy.    Plan    I had a long discussion with the patient today about his condition. He has a long-standing history of lymphadenopathy and has had multiple lymph node biopsies throughout the years which have been reactive but benign. He has undergone multiple procedures including colonoscopy, upper endoscopy and CT scans showing progression of his lymphadenopathy since 2007 but this has been subtle. A core biopsy was done of the left cervical lymph node which was insufficient material for flow cytometry. I discussed surgical approach is to lymph node biopsy. The left cervical lymph node would be the easiest to biopsy at this point. The intrathoracic and intra-abdominal lymphadenopathy or would require a laparotomy for thoracotomy and for tissue removal. This carries significant morbidity and risk. I explained to him to get an answer lymph node tissue needs to be removed. He understands the above. Surgical risks include bleeding, infection, injury to nerve, injury to blood vessels, injury to intra-abdominal organs, injury to the intrathoracic organs, pulmonary failure, death, DVT, pulmonary embolus, cardiovascular events,  and getting material that is nondiagnostic. She wants time to think about these options and will let me know if he wishes to proceed. I recommend biopsy of the cervical node first.       Anish Vana A. 04/29/2011, 10:11 AM

## 2011-04-29 NOTE — Patient Instructions (Signed)
Swollen Lymph Nodes The lymphatic system filters fluid from around cells. It is like a system of blood vessels. These channels carry lymph instead of blood. The lymphatic system is an important part of the immune (disease fighting) system. When people talk about "swollen glands in the neck," they are usually talking about swollen lymph nodes. The lymph nodes are like the little traps for infection. You and your caregiver may be able to feel lymph nodes, especially swollen nodes, in these common areas: the groin (inguinal area), armpits (axilla), and above the clavicle (supraclavicular). You may also feel them in the neck (cervical) and the back of the head just above the hairline (occipital). Swollen glands occur when there is any condition in which the body responds with an allergic type of reaction. For instance, the glands in the neck can become swollen from insect bites or any type of minor infection on the head. These are very noticeable in children with only minor problems. Lymph nodes may also become swollen when there is a tumor or problem with the lymphatic system, such as Hodgkin's disease. TREATMENT   Most swollen glands do not require treatment. They can be observed (watched) for a short period of time, if your caregiver feels it is necessary. Most of the time, observation is not necessary.   Antibiotics (medicines that kill germs) may be prescribed by your caregiver. Your caregiver may prescribe these if he or she feels the swollen glands are due to a bacterial (germ) infection. Antibiotics are not used if the swollen glands are caused by a virus.  HOME CARE INSTRUCTIONS   Take medications as directed by your caregiver. Only take over-the-counter or prescription medicines for pain, discomfort, or fever as directed by your caregiver.  SEEK MEDICAL CARE IF:   If you begin to run a temperature greater than 102 F (38.9 C), or as your caregiver suggests.  MAKE SURE YOU:   Understand these  instructions.   Will watch your condition.   Will get help right away if you are not doing well or get worse.  Document Released: 06/03/2002 Document Revised: 02/23/2011 Document Reviewed: 06/13/2005 [facility] Patient Information 2012 [place], [place].

## 2011-05-27 ENCOUNTER — Telehealth: Payer: Self-pay | Admitting: Family Medicine

## 2011-05-27 NOTE — Telephone Encounter (Signed)
Called, went to voicemail.  Will try at later date.  I do want to set up with urology evaluatino for abnormal testicular exam and question of complex hydroceles as well as hypogonadism. Also calling for update on surgery plan.

## 2011-05-27 NOTE — Telephone Encounter (Signed)
Spoke with pt.  He states has decided not to pursue evaluation of lymphadenopathy for now, states feels fine.  No further testicular pain/swelling issues.  Will set up referral to urologist for eval hypogonadism as well as abnormal testicular exam.  Have placed order in chart.  Pt hesitant to pursue further evaluation.

## 2011-05-30 NOTE — Telephone Encounter (Signed)
Left message re urology eval - will hold off for now given pt hesitance.  Will try and call later today.

## 2011-05-30 NOTE — Telephone Encounter (Signed)
Spoke with pt. Will postpone uro eval. Pt states would like referral for vasectomy but wants to wait for new year, will call me when wants this done. Would likely refer for hypotestosteronism, abnl exam and vasectomy eval

## 2012-03-07 ENCOUNTER — Ambulatory Visit (INDEPENDENT_AMBULATORY_CARE_PROVIDER_SITE_OTHER): Payer: 59 | Admitting: Family Medicine

## 2012-03-07 ENCOUNTER — Encounter: Payer: Self-pay | Admitting: Family Medicine

## 2012-03-07 VITALS — BP 150/96 | HR 82 | Temp 97.6°F | Wt 209.0 lb

## 2012-03-07 DIAGNOSIS — R1013 Epigastric pain: Secondary | ICD-10-CM

## 2012-03-07 DIAGNOSIS — R599 Enlarged lymph nodes, unspecified: Secondary | ICD-10-CM

## 2012-03-07 DIAGNOSIS — Z23 Encounter for immunization: Secondary | ICD-10-CM

## 2012-03-07 DIAGNOSIS — R002 Palpitations: Secondary | ICD-10-CM | POA: Insufficient documentation

## 2012-03-07 LAB — CBC WITH DIFFERENTIAL/PLATELET
Basophils Relative: 0.3 % (ref 0.0–3.0)
Eosinophils Absolute: 0.1 10*3/uL (ref 0.0–0.7)
HCT: 42.5 % (ref 39.0–52.0)
Lymphs Abs: 1.6 10*3/uL (ref 0.7–4.0)
MCHC: 32.8 g/dL (ref 30.0–36.0)
MCV: 79.9 fl (ref 78.0–100.0)
Monocytes Absolute: 0.5 10*3/uL (ref 0.1–1.0)
Neutrophils Relative %: 70.6 % (ref 43.0–77.0)
Platelets: 238 10*3/uL (ref 150.0–400.0)
RBC: 5.32 Mil/uL (ref 4.22–5.81)

## 2012-03-07 MED ORDER — OMEPRAZOLE 40 MG PO CPDR: 40.0000 mg | DELAYED_RELEASE_CAPSULE | Freq: Every day | ORAL | Status: DC

## 2012-03-07 NOTE — Patient Instructions (Signed)
EKG today. For stomach - could be dyspepsia - trial of omeprazole 40mg  daily for 3 weeks then as needed.  Take Gas X with meals. Pass by Marion's office to schedule CT scan.  We will call you with results.

## 2012-03-07 NOTE — Assessment & Plan Note (Signed)
New - occuring several times a week. Will start with checking EKG today - NSR rate 66, normal axis, intervals, no ST/T changes, good R wave progression.  PAC. sinus pause. overall stable.  No tachyarrhythmia documented. Discussed option of referral to cards, pt would like to monitor for now, start with CT evaluation for above. Recommend avoid stimulants like caffeine, to notify me if continued and would refer to cards for monitor.

## 2012-03-07 NOTE — Progress Notes (Signed)
Subjective:    Patient ID: Keith Bishop, male    DOB: 25-Feb-1971, 41 y.o.   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  Now with mid back and mid abdomen (epigastric) discomfort described as constant bloating that improves with belching.  Dull stable epigastric ache.  + reflux.  No heartburn.  Not food related.  Last BM was today, regular.  Thoracic back pain and stiffness noted recently - notes worse with bending over.  constant pain.  Described as ache.  No shooting pain down legs, no saddle anesthesia, no bowel/bladder accidents.  [place], lots of twisting.  Took last week off and has next week off.  sxs not really improving.  No fevers/chills, appetite changes, weight changes, nausea/vomiting, diarrhea, blood in stool.    Has tried ibuprofen/tylenol as well as took some leftover tramadol which only temporarily help.  Also noted some episodes of heart fluttering described as fast heartbeats, along with mid substernal chest discomfort.  Can happen at rest.  Noticing a few times each week.  No dizziness associated with this.    Wt Readings from Last 3 Encounters:  03/07/12 209 lb (94.802 kg)  04/29/11 205 lb (92.987 kg)  04/13/11 208 lb (94.348 kg)   Bad year this year - wife's sister committed suicide.  Then wife lost job.  Then wife's grandmother passed away.  Wife now with new job, issues normalizing.  H/o unexplained LAD, multiple workups unrevealing.  Continued left LAD in groin, stable.  Not enlarging or changing.  Hasn't noted swollen glands around neck anymore. From prior note: Several year history of unexplained LAD. States had bone marrow biopsy at age 86yo, negative. daughter with similar LN swelling, denies fmhx lymphoma or leukemia. Doesn't know father's side.  Lymphnode biopsy Date: 06/12/1996 Right supraclav area-negative  Lymphnode biopsy Date: 05/29/06 right cervical-negative (necrotizing  granulomatous inflammation)  LN biopsies x 2 in past unrevealing for cause of LAD.  CT chest/abd Date: 03/2006 progression of LAD in sup mediastinum, persistent lymphadenopathy in neck/R supraclav, splenomegaly  CT neck/chest/abd/pelvis Date: 02/2011 progression of LAD in chest, retroperitoneal area mediastinal, perihilar, extensive upper abdominal and retroperitoneal LAD with splenomegaly.  [place] core biopsy of left supraclavicular LN Date: 03/2011 - lymphoid tissue, QNS for flow cytometry   Medications and allergies reviewed and updated in chart.  Past histories reviewed and updated if relevant as below. Patient Active Problem List  Diagnosis  . HYPERTRIGLYCERIDEMIA  . Vitiligo  . FATTY LIVER DISEASE, HX OF  . Malaise and fatigue  . Tooth pain  . IDA (iron deficiency anemia)  . LBP (low back pain)  . Iron deficiency  . Lymph node enlargement  . Primary male hypogonadism  . Testicular swelling, left   Past Medical History  Diagnosis Date  . Vitiligo   . Personal history of tobacco use, presenting hazards to health   . Pure hyperglyceridemia   . Fatty liver disease, nonalcoholic   . Other malaise and fatigue   . Benign neoplasm of skin of trunk, except scrotum   . Anemia   . Lymphadenopathy     s/p cervical/supraclavicular biopsy 1997 and again 2006 and 2007, necrotizing granulomatous inflammation, possibly thought to be due to periodontal disease  . Primary male hypogonadism    Past Surgical History  Procedure Date  . Lymphnode biopsy 06/12/1996    Right supraclav area-negative   . Lymphnode biopsy 03/04/05, 05/29/06    right cervical-negative (  necrotizing granulomatous inflammation)  . Inguinal hernia repair 04/05/07    Left-Dr. Luisa Hart  . Mouth surgery 9/11  . Ct chest/abd 03/2006    progression of LAD in sup mediastinum, persistent lymphadenopathy in neck/R supraclav, splenomegaly  . Colonoscopy/endoscopy 02/2011    overall normal - focal mild chronic gastritis, rectal  serrated adenoma polyp rpt 5 yrs  . Ct chest/abd/pelvis 02/2011    moderate thoracic inlet, mild mediastinal LAD, extensive upper abd and retroperitoneal LAD, splenomegaly.  consider lymphoma   History  Substance Use Topics  . Smoking status: Former Smoker    Quit date: 04/28/2009  . Smokeless tobacco: Never Used  . Alcohol Use: No   Family History  Problem Relation Age of Onset  . Heart attack Father 60    deceased  . Hypertension Mother   . Diabetes Mother   . Hypertension Maternal Grandmother   . Irritable bowel syndrome Maternal Grandmother   . Hypertension Sister   . Diabetes Sister   . Obesity Sister   . Alcohol abuse Maternal Uncle   . Gestational diabetes Sister   . Colon cancer Neg Hx   . Cancer Maternal Grandfather     colon   No Known Allergies Current Outpatient Prescriptions on File Prior to Visit  Medication Sig Dispense Refill  . acetaminophen (TYLENOL) 500 MG tablet Take 500 mg by mouth every 6 (six) hours as needed.          Review of Systems Per HPI    Objective:   Physical Exam  Nursing note and vitals reviewed. Constitutional: He appears well-developed and well-nourished. No distress.  HENT:  Head: Normocephalic.  Mouth/Throat: Oropharynx is clear and moist. No oropharyngeal exudate.  Eyes: Conjunctivae normal and EOM are normal. Pupils are equal, round, and reactive to light. No scleral icterus.  Neck: Normal range of motion. Neck supple.  Cardiovascular: Normal rate, regular rhythm, normal heart sounds and intact distal pulses.   No murmur heard. Pulmonary/Chest: Effort normal and breath sounds normal. No respiratory distress. He has no wheezes. He has no rales.  Abdominal: Soft. Bowel sounds are normal. He exhibits no distension and no mass. There is no hepatosplenomegaly. There is no tenderness. There is no rebound, no guarding and no CVA tenderness.  Musculoskeletal: He exhibits no edema.       No midline spine tendenress  Lymphadenopathy:     He has no cervical adenopathy.       Right cervical: No superficial cervical, no deep cervical and no posterior cervical adenopathy present.      Left cervical: No superficial cervical, no deep cervical and no posterior cervical adenopathy present.       Right: No supraclavicular adenopathy present.       Left: No supraclavicular adenopathy present.  Skin: Skin is warm and dry. No rash noted.  Psychiatric: He has a normal mood and affect.       Assessment & Plan:

## 2012-03-07 NOTE — Assessment & Plan Note (Addendum)
With some thoracic spine discomfort but exam overall normal. Check blood work today. Discussed concern for progression of known upper abd/retroperitoneal LAD - will start with obtaining CT abd/pelvis. Possible dyspepsia - will also treat with omeprazole course for 3 wks. Also recommended trial of gas x with meals.

## 2012-03-08 LAB — COMPREHENSIVE METABOLIC PANEL
AST: 20 U/L (ref 0–37)
Albumin: 4.3 g/dL (ref 3.5–5.2)
Alkaline Phosphatase: 96 U/L (ref 39–117)
Potassium: 4.7 mEq/L (ref 3.5–5.1)
Sodium: 142 mEq/L (ref 135–145)
Total Protein: 7.8 g/dL (ref 6.0–8.3)

## 2012-03-14 ENCOUNTER — Telehealth: Payer: Self-pay | Admitting: Family Medicine

## 2012-03-14 DIAGNOSIS — R599 Enlarged lymph nodes, unspecified: Secondary | ICD-10-CM

## 2012-03-14 NOTE — Telephone Encounter (Signed)
See scanned CT report. Spoke with patient. abd pain improved with omeprazole. Discussed concern for spread.  Pt agrees to re eval with surgery to again discuss options. I also would appreciate Dr. Rosezena Sensor opinion on progressive LAD. Placed order in chart.

## 2012-03-14 NOTE — Telephone Encounter (Signed)
Faxed CT report to Dr Luisa Hart, waiting to hear back from [facility].

## 2012-03-16 ENCOUNTER — Encounter: Payer: Self-pay | Admitting: Family Medicine

## 2012-03-22 ENCOUNTER — Other Ambulatory Visit: Payer: Self-pay

## 2012-03-22 ENCOUNTER — Encounter (INDEPENDENT_AMBULATORY_CARE_PROVIDER_SITE_OTHER): Payer: Self-pay | Admitting: Surgery

## 2012-03-22 ENCOUNTER — Ambulatory Visit (INDEPENDENT_AMBULATORY_CARE_PROVIDER_SITE_OTHER): Payer: 59 | Admitting: Surgery

## 2012-03-22 VITALS — BP 118/64 | HR 68 | Temp 98.3°F | Resp 20 | Ht 75.5 in | Wt 210.8 lb

## 2012-03-22 DIAGNOSIS — R599 Enlarged lymph nodes, unspecified: Secondary | ICD-10-CM

## 2012-03-22 DIAGNOSIS — R591 Generalized enlarged lymph nodes: Secondary | ICD-10-CM

## 2012-03-22 NOTE — Patient Instructions (Signed)
Refer to [facility] center for persistent lymphadenopathy

## 2012-03-22 NOTE — Progress Notes (Signed)
Patient ID: Keith Bishop, male   DOB: 02/17/1971, 41 y.o.   MRN: [id number]  Chief Complaint  Patient presents with  . Routine Post Op    reck lymphadenopathy    HPI Keith Bishop is a 41 y.o. male.  Patient sent at the request of Dr.Guiterez due to  Lymphadenopathy.he's had this problem since 2007 and had a first lymph node biopsy at age 89. Workup has been negative each time. He has more periaortic and mediastinal lymphadenopathy on recent CT scan done for epigastric pain. He does have night sweats. He has a good appetite has not lost weight. HPI  Past Medical History  Diagnosis Date  . Vitiligo   . Personal history of tobacco use, presenting hazards to health   . Pure hyperglyceridemia   . Fatty liver disease, nonalcoholic   . Other malaise and fatigue   . Benign neoplasm of skin of trunk, except scrotum   . Anemia   . Lymphadenopathy     s/p cervical/supraclavicular biopsy 1997 and again 2006 and 2007, necrotizing granulomatous inflammation, possibly thought to be due to periodontal disease, again 03/2011 - lymphoid tissue, QNS for flow cytometry  . Primary male hypogonadism     Past Surgical History  Procedure Date  . Lymphnode biopsy 06/12/1996    Right supraclav area-negative   . Lymphnode biopsy 03/04/05, 05/29/06    right cervical-negative (necrotizing granulomatous inflammation)  . Inguinal hernia repair 04/05/07    Left-Dr. Luisa Hart  . Mouth surgery 9/11  . Ct chest/abd 03/2006    progression of LAD in sup mediastinum, persistent lymphadenopathy in neck/R supraclav, splenomegaly  . Colonoscopy/endoscopy 02/2011    overall normal - focal mild chronic gastritis, rectal serrated adenoma polyp rpt 5 yrs  . Ct chest/abd/pelvis 02/2011    moderate thoracic inlet, mild mediastinal LAD, extensive upper abd and retroperitoneal LAD, splenomegaly.  consider lymphoma  . Lymphnode biopsy 03/2011    left supraclav area- lymphoid tissue, QNS for flow cytometry    Family  History  Problem Relation Age of Onset  . Heart attack Father 67    deceased  . Hypertension Mother   . Diabetes Mother   . Hypertension Maternal Grandmother   . Irritable bowel syndrome Maternal Grandmother   . Hypertension Sister   . Diabetes Sister   . Obesity Sister   . Alcohol abuse Maternal Uncle   . Gestational diabetes Sister   . Colon cancer Neg Hx   . Cancer Maternal Grandfather     colon    Social History History  Substance Use Topics  . Smoking status: Former Smoker    Quit date: 04/28/2009  . Smokeless tobacco: Never Used  . Alcohol Use: No    No Known Allergies  Current Outpatient Prescriptions  Medication Sig Dispense Refill  . acetaminophen (TYLENOL) 500 MG tablet Take 500 mg by mouth every 6 (six) hours as needed.        Marland Kitchen ibuprofen (ADVIL,MOTRIN) 200 MG tablet Take 200 mg by mouth every 6 (six) hours as needed.      Marland Kitchen omeprazole (PRILOSEC) 40 MG capsule Take 1 capsule (40 mg total) by mouth daily.  30 capsule  3    Review of Systems Review of Systems  Constitutional: Negative for chills, diaphoresis, fatigue and unexpected weight change.  HENT: Negative.   Eyes: Negative.   Respiratory: Negative.   Cardiovascular: Negative.   Genitourinary: Negative.   Musculoskeletal: Negative.   Neurological: Negative.   Hematological: Negative.   Psychiatric/Behavioral:  Negative.     Blood pressure 118/64, pulse 68, temperature 98.3 F (36.8 C), temperature source Temporal, resp. rate 20, height 6' 3.5" (1.918 m), weight 210 lb 12.8 oz (95.618 kg).  Physical Exam Physical Exam  Constitutional: He appears well-developed and well-nourished.  HENT:  Head: Normocephalic and atraumatic.  Eyes: EOM are normal. Pupils are equal, round, and reactive to light.  Neck: Normal range of motion.  Cardiovascular: Normal rate and regular rhythm.   Pulmonary/Chest: Effort normal and breath sounds normal.  Abdominal: Soft. Bowel sounds are normal. He exhibits no  distension.  Lymphadenopathy:       Head (right side): No submental, no submandibular, no tonsillar, no preauricular, no posterior auricular and no occipital adenopathy present.       Head (left side): No submental, no submandibular, no tonsillar, no preauricular and no occipital adenopathy present.       Right cervical: No superficial cervical, no deep cervical and no posterior cervical adenopathy present.      Left cervical: No superficial cervical, no deep cervical and no posterior cervical adenopathy present.    He has no axillary adenopathy.       Right: No inguinal and no supraclavicular adenopathy present.       Left: No inguinal and no supraclavicular adenopathy present.    Data Reviewed CT chest abdomen pelvis   peritoneal lymphadenopathy and mediastinal lymphadenopathy  Assessment    lymphadenopathy    Plan    This has been a ongoing problem for him since he was 41 years old. He has had multiple biopsies in the past which have not shown lymphoma. I feel referral to a tertiary care center at this point he would be the best interest prior to any surgical biopsy attempts since the lymph nodes that are seen on CT scan are not easy to access. Clinically, he appears well. Will refer to [facility] for opinion.       [name] A. 03/22/2012, 9:49 AM

## 2012-03-22 NOTE — Telephone Encounter (Signed)
Pt said omeprazole was not at [facility]. Spoke with Alice at [facility] rx was on hold and will get ready for pt to pick up. Pt notified while on phone.

## 2012-05-01 IMAGING — CR DG LUMBAR SPINE COMPLETE 4+V
5 series · 5 of 5 positions shown · non-contrast
Comparison: 04/21/2006.

CLINICAL DATA: Low back pain.  Recent worsening low back pain.

LUMBAR SPINE - COMPLETE 4+ VIEW

[view not recorded (1 of 5)]
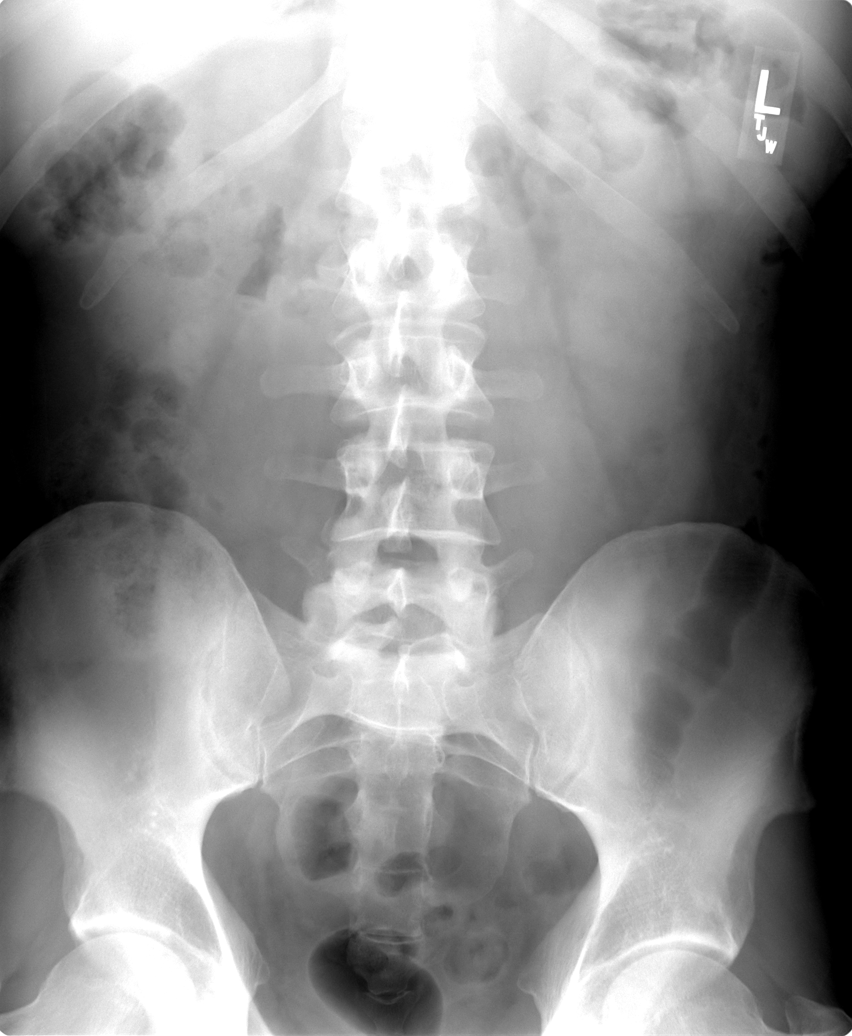

[view not recorded (2 of 5)]
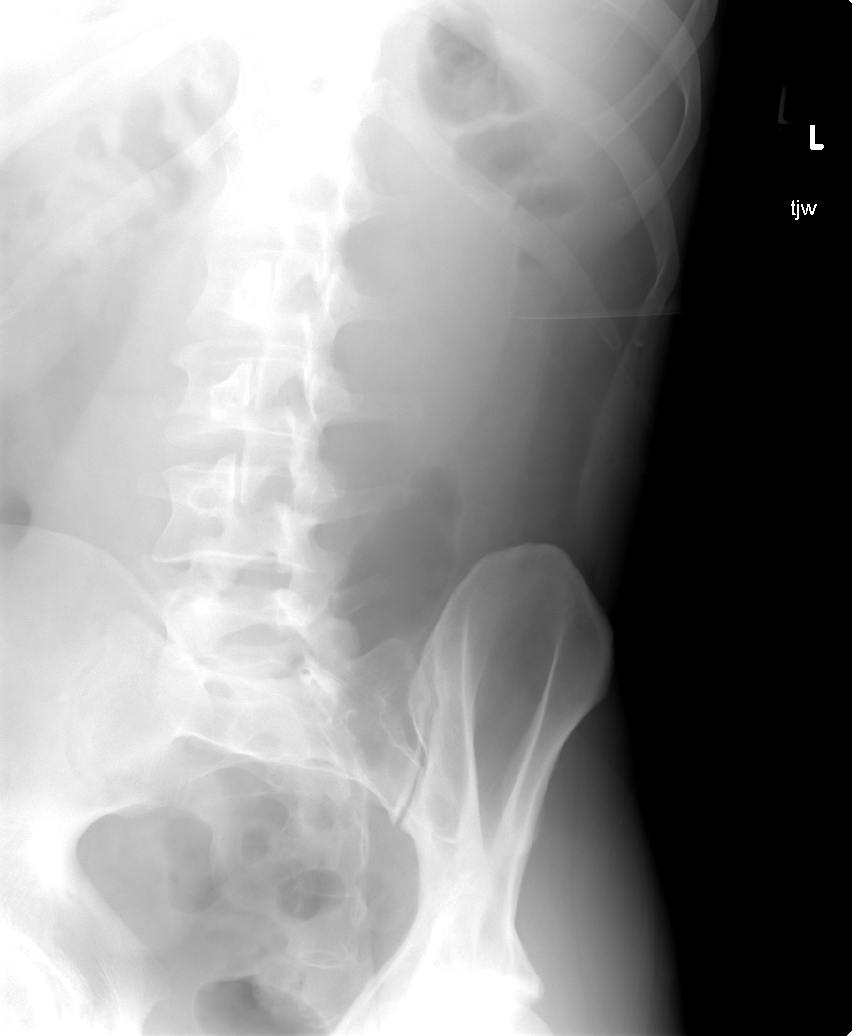

[view not recorded (3 of 5)]
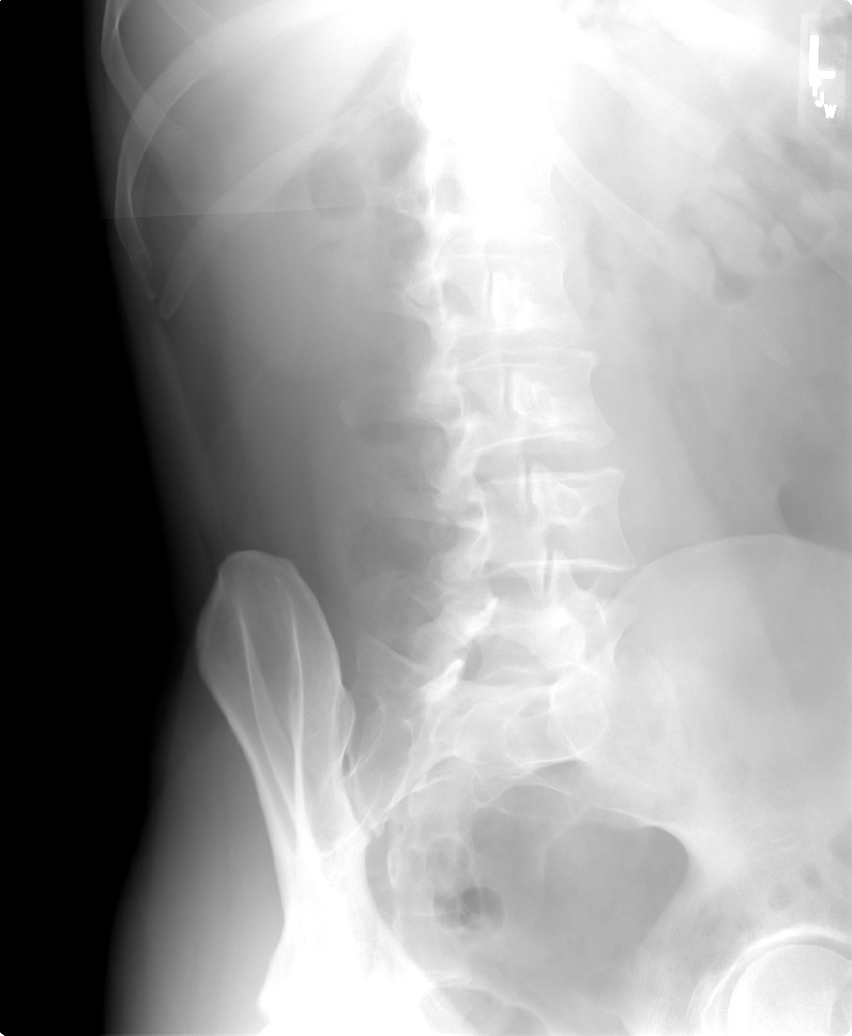

[view not recorded (4 of 5)]
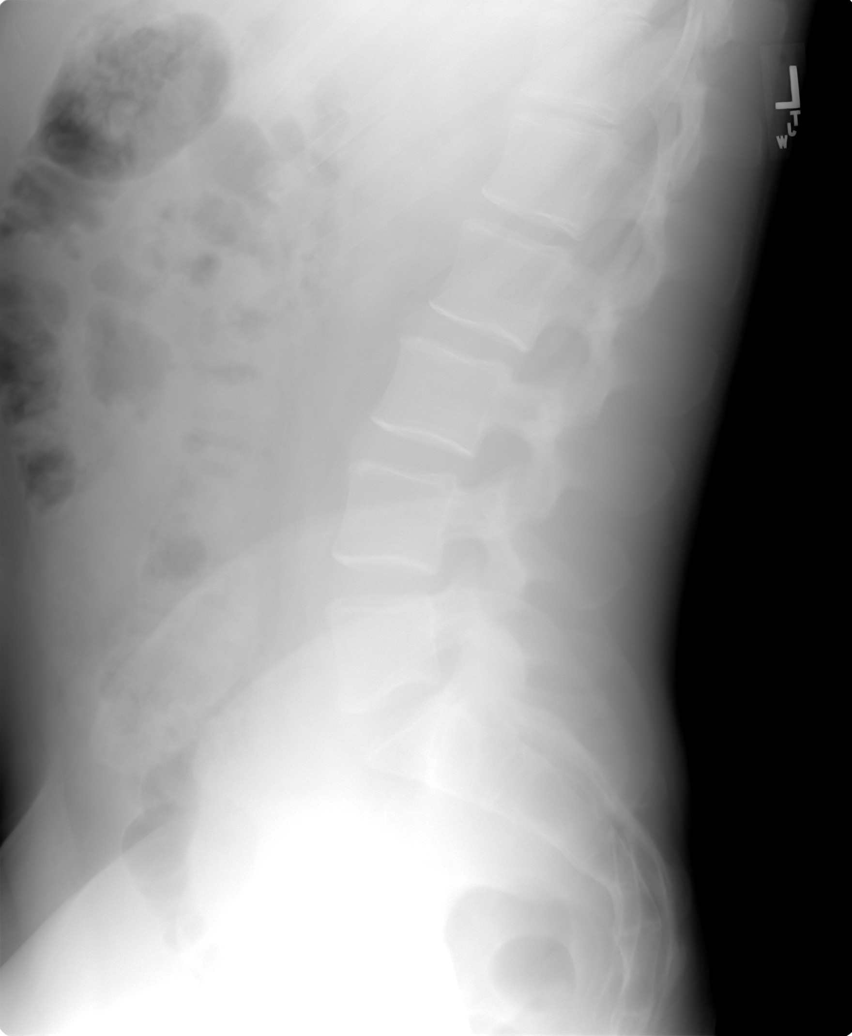

[view not recorded (5 of 5)]
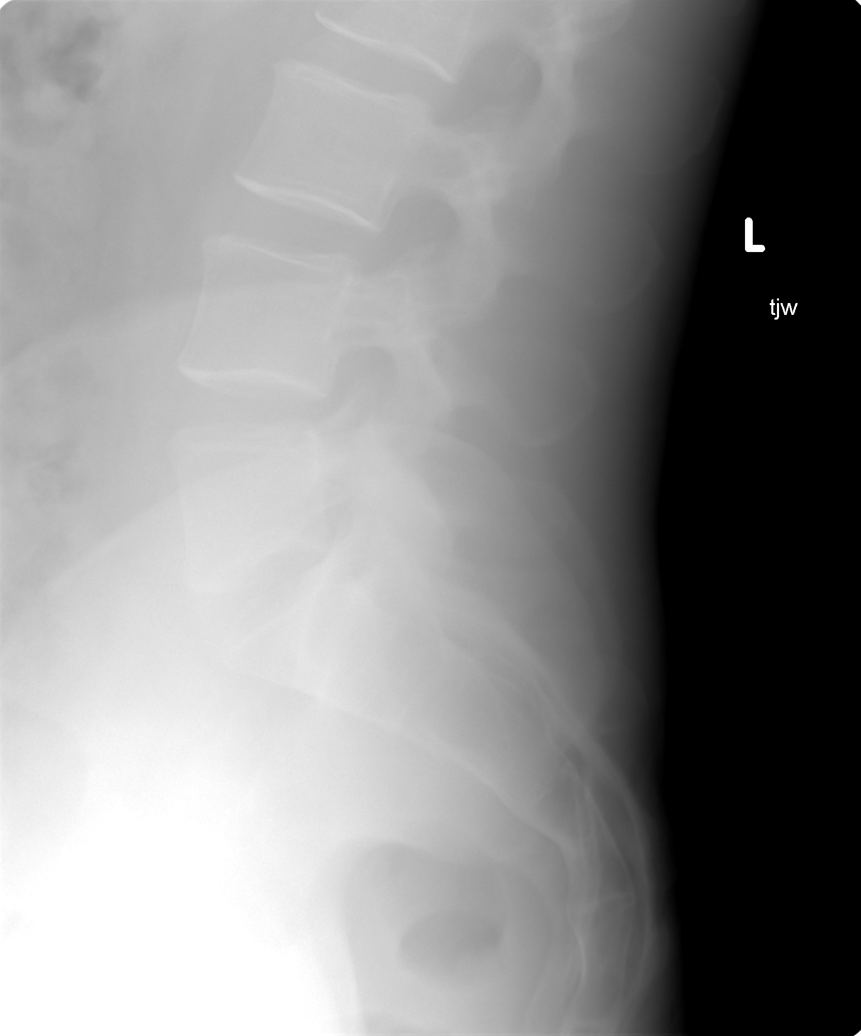

[5 of 5 positions shown; findings below may reference images not displayed]

FINDINGS: Five lumbar type vertebral bodies are present.  Alignment
is anatomic.  No pars defects.  Vertebral body heights and
intervertebral disc spaces are preserved.  Lumbosacral junction
appears normal.
IMPRESSION: Normal lumbar spine radiographs.

## 2013-07-04 ENCOUNTER — Encounter: Payer: Self-pay | Admitting: Family Medicine

## 2013-07-04 ENCOUNTER — Ambulatory Visit (INDEPENDENT_AMBULATORY_CARE_PROVIDER_SITE_OTHER)
Admission: RE | Admit: 2013-07-04 | Discharge: 2013-07-04 | Disposition: A | Discharge status: Home / Self Care | Payer: 59 | Source: Ambulatory Visit | Attending: Family Medicine | Admitting: Family Medicine

## 2013-07-04 ENCOUNTER — Ambulatory Visit (INDEPENDENT_AMBULATORY_CARE_PROVIDER_SITE_OTHER): Payer: 59 | Admitting: Family Medicine

## 2013-07-04 VITALS — BP 130/94 | HR 64 | Temp 97.6°F | Wt 215.0 lb

## 2013-07-04 DIAGNOSIS — R599 Enlarged lymph nodes, unspecified: Secondary | ICD-10-CM

## 2013-07-04 DIAGNOSIS — R0989 Other specified symptoms and signs involving the circulatory and respiratory systems: Secondary | ICD-10-CM

## 2013-07-04 DIAGNOSIS — R0609 Other forms of dyspnea: Secondary | ICD-10-CM

## 2013-07-04 LAB — CBC WITH DIFFERENTIAL/PLATELET
BASOS ABS: 0 10*3/uL (ref 0.0–0.1)
Basophils Relative: 0.3 % (ref 0.0–3.0)
EOS ABS: 0.1 10*3/uL (ref 0.0–0.7)
Eosinophils Relative: 1.9 % (ref 0.0–5.0)
HCT: 43.2 % (ref 39.0–52.0)
Hemoglobin: 14.7 g/dL (ref 13.0–17.0)
Lymphocytes Relative: 21.5 % (ref 12.0–46.0)
Lymphs Abs: 1.6 10*3/uL (ref 0.7–4.0)
MCHC: 34.1 g/dL (ref 30.0–36.0)
MCV: 77.5 fl — AB (ref 78.0–100.0)
MONO ABS: 0.4 10*3/uL (ref 0.1–1.0)
Monocytes Relative: 5.1 % (ref 3.0–12.0)
NEUTROS PCT: 71.2 % (ref 43.0–77.0)
Neutro Abs: 5.4 10*3/uL (ref 1.4–7.7)
PLATELETS: 263 10*3/uL (ref 150.0–400.0)
RBC: 5.57 Mil/uL (ref 4.22–5.81)
RDW: 13.8 % (ref 11.5–14.6)
WBC: 7.6 10*3/uL (ref 4.5–10.5)

## 2013-07-04 LAB — LACTATE DEHYDROGENASE: LDH: 138 U/L (ref 94–250)

## 2013-07-04 LAB — COMPREHENSIVE METABOLIC PANEL
ALBUMIN: 4.4 g/dL (ref 3.5–5.2)
ALK PHOS: 93 U/L (ref 39–117)
ALT: 31 U/L (ref 0–53)
AST: 17 U/L (ref 0–37)
BUN: 10 mg/dL (ref 6–23)
CHLORIDE: 105 meq/L (ref 96–112)
CO2: 31 mEq/L (ref 19–32)
Calcium: 9.3 mg/dL (ref 8.4–10.5)
Creatinine, Ser: 1.1 mg/dL (ref 0.4–1.5)
GFR: 77.4 mL/min (ref >60.00)
GLUCOSE: 85 mg/dL (ref 70–99)
POTASSIUM: 4.2 meq/L (ref 3.5–5.1)
SODIUM: 143 meq/L (ref 135–145)
TOTAL PROTEIN: 7.6 g/dL (ref 6.0–8.3)
Total Bilirubin: 0.8 mg/dL (ref 0.3–1.2)

## 2013-07-04 NOTE — Progress Notes (Signed)
Pre-visit discussion using our clinic review tool. No additional management support is needed unless otherwise documented below in the visit note.  

## 2013-07-04 NOTE — Progress Notes (Signed)
Subjective:    Patient ID: Keith Bishop, male    DOB: 03/23/1971, 43 y.o.   MRN: [id number]  HPI CC: dyspnea on exertion  Over last few months noticing increasing dyspnea with exertion.   Carried chair up flight of stairs a few weeks ago and noticed significant dyspnea with this. 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  Heavier exertion brings it on. Does notice some tightness when taking a deep breath.  Does notice episodes of rapid heart beats at random times throughout the day. Occasional night sweats.  Denies chest pain/tightness, leg swelling, headaches, coughing or wheezing.  No fevers/chills, weight changes or appetite changes.  No recent abd pain, bowel changes (diarrhea/constipation), nausea/vomiting. No h/o asthma or COPD. Quit smoking 2010.  H/o diffuse progressively enlarging mediastinal and peritoneal lymphadenopathy, h/o peripheral neuropathy in past s/p multiple unrevealing biopsies (first one at 27 yoa).  Last referred back to surgery Dr. Brantley Stage 2013 who recommended evaluation by tertiary care center (see his note dated 03/22/12).  Pt never went, states he was never contacted to schedule appt.  Wt Readings from Last 3 Encounters:  07/04/13 215 lb (97.523 kg)  03/22/12 210 lb 12.8 oz (95.618 kg)  03/07/12 209 lb (94.802 kg)   From prior notes: Several year history of unexplained LAD. States had bone marrow biopsy at age 43yo, negative. daughter with similar LN swelling, denies fmhx lymphoma or leukemia. Doesn't know father's side.  Lymphnode biopsy Date: 06/12/1996 open Right supraclav area-negative  Lymphnode biopsy Date: 05/29/06 open right cervical-negative (necrotizing granulomatous inflammation)  LN biopsies x 2 in past unrevealing for cause of LAD.  CT chest/abd Date: 03/2006 progression of LAD in sup mediastinum, persistent lymphadenopathy in neck/R supraclav, splenomegaly  CT neck/chest/abd/pelvis Date: 02/2011  progression of LAD in chest, retroperitoneal area mediastinal, perihilar, extensive upper abdominal and retroperitoneal LAD with splenomegaly.  [place] core needle biopsy of left supraclavicular LN Date: 03/2011 - lymphoid tissue, QNS for flow cytometry   Medications and allergies reviewed and updated in chart.  Past histories reviewed and updated if relevant as below. Patient Active Problem List   Diagnosis Date Noted  . Heart palpitations 03/07/2012  . Epigastric discomfort 03/07/2012  . Primary male hypogonadism   . Iron deficiency 02/08/2011  . Lymph node enlargement 02/08/2011  . LBP (low back pain) 01/28/2011  . IDA (iron deficiency anemia) 01/19/2011  . Malaise and fatigue 01/16/2011  . Tooth pain 01/16/2011  . Vitiligo 03/03/2009  . HYPERTRIGLYCERIDEMIA 11/03/2006  . FATTY LIVER DISEASE, HX OF 11/03/2006   Past Medical History  Diagnosis Date  . Vitiligo   . Personal history of tobacco use, presenting hazards to health   . Pure hyperglyceridemia   . Fatty liver disease, nonalcoholic   . Other malaise and fatigue   . Benign neoplasm of skin of trunk, except scrotum   . Anemia   . Lymphadenopathy     s/p cervical/supraclavicular biopsy 1997 and again 2006 and 2007, necrotizing granulomatous inflammation, possibly thought to be due to periodontal disease, again 03/2011 - lymphoid tissue, QNS for flow cytometry  . Primary male hypogonadism    Past Surgical History  Procedure Laterality Date  . Lymphnode biopsy Right 06/12/1996    supraclav area-negative   . Lymphnode biopsy Right 03/04/05, 05/29/06    open cervical-negative (necrotizing granulomatous inflammation)  . Inguinal hernia repair Left 04/05/07    Dr. Brantley Stage  . Mouth surgery  9/11  . Ct  chest/abd  03/2006    progression of LAD in sup mediastinum, persistent lymphadenopathy in neck/R supraclav, splenomegaly  . Colonoscopy/endoscopy  02/2011    overall normal - focal mild chronic gastritis, rectal serrated adenoma  polyp rpt 5 yrs  . Ct chest/abd/pelvis  02/2011    moderate thoracic inlet, mild mediastinal LAD, extensive upper abd and retroperitoneal LAD, splenomegaly.  consider lymphoma  . Lymphnode biopsy Left 03/2011    core needle biopsy - supraclav area- lymphoid tissue, QNS for flow cytometry   History  Substance Use Topics  . Smoking status: Former Smoker    Quit date: 04/28/2009  . Smokeless tobacco: Never Used  . Alcohol Use: No   Family History  Problem Relation Age of Onset  . Heart attack Father 82    deceased  . Hypertension Mother   . Diabetes Mother   . Hypertension Maternal Grandmother   . Irritable bowel syndrome Maternal Grandmother   . Hypertension Sister   . Diabetes Sister   . Obesity Sister   . Alcohol abuse Maternal Uncle   . Gestational diabetes Sister   . Colon cancer Neg Hx   . Cancer Maternal Grandfather     colon   No Known Allergies Current Outpatient Prescriptions on File Prior to Visit  Medication Sig Dispense Refill  . acetaminophen (TYLENOL) 500 MG tablet Take 500 mg by mouth every 6 (six) hours as needed.        Marland Kitchen ibuprofen (ADVIL,MOTRIN) 200 MG tablet Take 200 mg by mouth every 6 (six) hours as needed.       No current facility-administered medications on file prior to visit.     Review of Systems Per HPI    Objective:   Physical Exam  Nursing note and vitals reviewed. Constitutional: He appears well-developed and well-nourished. No distress.  HENT:  Mouth/Throat: Oropharynx is clear and moist. No oropharyngeal exudate.  Poor dentition  Cardiovascular: Normal rate, regular rhythm, normal heart sounds and intact distal pulses.   No murmur heard. Pulmonary/Chest: Effort normal and breath sounds normal. No respiratory distress. He has no wheezes. He has no rales.  Abdominal: Soft. Normal appearance and bowel sounds are normal. He exhibits no distension and no mass. There is no hepatosplenomegaly. There is no tenderness. There is no rigidity, no  rebound, no guarding, no CVA tenderness and negative Murphy's sign.  Musculoskeletal: He exhibits no edema.  Lymphadenopathy:    He has no cervical adenopathy.    He has no axillary adenopathy.       Right axillary: No lateral adenopathy present.       Left axillary: No lateral adenopathy present.      Right: No inguinal and no supraclavicular adenopathy present.       Left: No inguinal and no supraclavicular adenopathy present.       Assessment & Plan:

## 2013-07-04 NOTE — Patient Instructions (Signed)
Blood work today.  Xray today - will call you with results. [facility] enlarged glands. In meantime, ok to slowly increase aerobic activity.

## 2013-07-05 ENCOUNTER — Encounter: Payer: Self-pay | Admitting: *Deleted

## 2013-07-05 DIAGNOSIS — R0609 Other forms of dyspnea: Secondary | ICD-10-CM | POA: Insufficient documentation

## 2013-07-05 NOTE — Assessment & Plan Note (Addendum)
Unclear etiology. Not consistent with cardiac or pulmonary cause.  ?progression of LAD.  Check CXR today - overall clear on my read. Check LDH, BMP, CBC today. Did recommend slow increase in aerobic activity. PF today = 450, expected for age and height 615, 73% expected.  If not improving, recommend return for spirometry and rpt chest CT.

## 2013-07-05 NOTE — Assessment & Plan Note (Addendum)
H/o peripheral, mediastinal and peritoneal LAD.  Overall stable period over last year.  Regardless did recommend referral to tertiary center for their opinion. No significant peripheral LAD on exam today, no obvious site for open LN biopsy. Will complete referral today.

## 2013-07-07 ENCOUNTER — Encounter: Payer: Self-pay | Admitting: Family Medicine

## 2013-07-07 ENCOUNTER — Telehealth: Payer: Self-pay | Admitting: Family Medicine

## 2013-07-07 NOTE — Telephone Encounter (Signed)
plz notify all studies returned ok, but his lung function test in office was somewhat low.   If slowly increasing activity doesn't help with shortness of breath, would recommend CT of lungs to evaluate for other cause shortness of breath such as enlarging glands.

## 2013-07-08 NOTE — Telephone Encounter (Signed)
Patient advised.

## 2013-08-08 ENCOUNTER — Encounter: Payer: Self-pay | Admitting: Family Medicine

## 2013-10-24 ENCOUNTER — Encounter: Payer: Self-pay | Admitting: Family Medicine

## 2013-10-24 ENCOUNTER — Ambulatory Visit (INDEPENDENT_AMBULATORY_CARE_PROVIDER_SITE_OTHER): Payer: 59 | Admitting: Family Medicine

## 2013-10-24 VITALS — BP 140/80 | HR 65 | Temp 97.7°F | Ht 74.5 in | Wt 217.5 lb

## 2013-10-24 DIAGNOSIS — M7651 Patellar tendinitis, right knee: Secondary | ICD-10-CM

## 2013-10-24 DIAGNOSIS — M7650 Patellar tendinitis, unspecified knee: Secondary | ICD-10-CM

## 2013-10-24 NOTE — Progress Notes (Signed)
[place] [address] Phone: [phone] Fax: [phone]  Patient ID: Keith Bishop MRN: [id number], DOB: April 09, 1971, 43 y.o. Date of Encounter: 10/24/2013  Primary Physician:  Ria Bush, MD   Chief Complaint: Knee Pain   Subjective:   History of Present Illness:  This 43 y.o. male patient presents with intermittent R knee pain after no injury. No audible pop was heard. The patient has not had an effusion. No symptomatic giving-way. No mechanical clicking. Joint has not locked up. Patient has been able to walk but is limping. The patient does have pain going up and down stairs or rising from a seated position.   Pain location: anterior Current physical activity: occ Prior Knee Surgery: none Current pain meds: none Bracing: none Occupation or school level: Optometrist  The PMH, [place], Social History, Family History, Medications, and allergies have been reviewed in [facility], and have been updated if relevant.  ROS: no acute illness or fever MSK: above GI: tol po intake without nausea or vomitting. Neuro: no numbness, tingling, or radiculopathy O/w per hpi  Objective:   PHYSICAL EXAM  Blood pressure 140/80, pulse 65, temperature 97.7 F (36.5 C), temperature source Oral, height 6' 2.5" (1.892 m), weight 217 lb 8 oz (98.657 kg).  GEN: Well-developed,well-nourished,in no acute distress; alert,appropriate and cooperative throughout examination HEENT: Normocephalic and atraumatic without obvious abnormalities. Ears, externally no deformities PULM: Breathing comfortably in no respiratory distress EXT: No clubbing, cyanosis, or edema PSYCH: Normally interactive. Cooperative during the interview. Pleasant. Friendly and conversant. Not anxious or depressed appearing. Normal, full affect.  Gait: Normal heel toe pattern ROM: WNL Effusion: neg Echymosis or edema: none Patellar tendon mild TTP Painful PLICA: neg Patellar grind: negative Medial and  lateral patellar facet loading: negative medial and lateral joint lines:NT Mcmurray's neg Flexion-pinch neg Varus and valgus stress: stable Lachman: neg Ant and Post drawer: neg Hip abduction, IR, ER: WNL Hip flexion str: 5/5 Hip abd: 5/5 Quad: 5/5 VMO atrophy:No Hamstring concentric and eccentric: 5/5  Radiology: No results found.  Assessment & Plan:   Patellar tendonitis of right knee  Reviewed anatomy. Motrin 600 - 800 mg recommended TID. (Over the counter Motrin, Advil, or Generic Ibuprofen 200 mg tablets. 3-4 tablets by mouth 3 times a day. This equals a prescription strength dose.)   Patellar compression strap recommended for patient. Given information about "Patellar Helix" strap from [employer].   Patient Instructions  [employer]  Www.bodyhelix.com  Use website instuctions for measurement of limb to determine size.   Look for "Patellar Helix" - this should be placed underneath the kneecap on the affected side and above the bony part at the upper end of your tibia. - It should fit in the soft spot where your patellar tendon is located.  Over the years, I have found that athletes and active people like this product the most with patellar tendonitis. It costs about 40 dollars.  (I have no financial interest in this company and gain nothing from recommending their products)     Follow-up: No Follow-up on file. Unless noted above, the patient is to follow-up if symptoms worsen. Red flags were reviewed with the patient. Otherwise, they should follow-up for routine medical care.   Signed,  Maud Deed. Kollyns Mickelson, MD, [place]  Patient's Medications  New Prescriptions   No medications on file  Previous Medications   ACETAMINOPHEN (TYLENOL) 500 MG TABLET    Take 500 mg by mouth every 6 (six) hours as  needed.     FEXOFENADINE (ALLEGRA) 180 MG TABLET    Take 180 mg by mouth daily.   IBUPROFEN (ADVIL,MOTRIN) 200 MG TABLET    Take 200 mg by mouth every 6  (six) hours as needed.   OMEPRAZOLE (PRILOSEC OTC) 20 MG TABLET    Take 20 mg by mouth daily as needed.  Modified Medications   No medications on file  Discontinued Medications   No medications on file

## 2013-10-24 NOTE — Patient Instructions (Signed)
[employer]ODY[employer]IX  Www.bodyhelix.com  Use website instuctions for measurement of limb to determine size.   Look for "Patellar Helix" - this should be placed underneath the kneecap on the affected side and above the bony part at the upper end of your tibia. - It should fit in the soft spot where your patellar tendon is located.  Over the years, I have found that athletes and active people like this product the most with patellar tendonitis. It costs about 40 dollars.  (I have no financial interest in this company and gain nothing from recommending their products)

## 2013-10-24 NOTE — Progress Notes (Signed)
Pre visit review using our clinic review tool, if applicable. No additional management support is needed unless otherwise documented below in the visit note. 

## 2014-05-05 ENCOUNTER — Ambulatory Visit (INDEPENDENT_AMBULATORY_CARE_PROVIDER_SITE_OTHER): Payer: 59 | Admitting: Family Medicine

## 2014-05-05 ENCOUNTER — Encounter: Payer: Self-pay | Admitting: Family Medicine

## 2014-05-05 VITALS — BP 130/80 | HR 84 | Temp 98.4°F | Wt 209.5 lb

## 2014-05-05 DIAGNOSIS — E291 Testicular hypofunction: Secondary | ICD-10-CM

## 2014-05-05 DIAGNOSIS — M545 Low back pain, unspecified: Secondary | ICD-10-CM

## 2014-05-05 MED ORDER — METHOCARBAMOL 500 MG PO TABS: 500.0000 mg | ORAL_TABLET | Freq: Three times a day (TID) | ORAL | Status: DC | PRN

## 2014-05-05 MED ORDER — NAPROXEN 500 MG PO TABS: ORAL_TABLET | ORAL | Status: DC

## 2014-05-05 NOTE — Assessment & Plan Note (Signed)
Will recheck Testosterone when pt returns for fasting am labs prior to upcoming CPE and discuss results/treatment.

## 2014-05-05 NOTE — Assessment & Plan Note (Signed)
Of 3 wks duration without noted radiculopathy Anticipate he has uncomplicated lumbar strain. Treat with naprosyn 500mg  x 1 wk, robaxin, and encouraged continued walking. Also provided with exercises from [facility] pt advisor. Update if not improving - would consider rpt imaging and PT. Pt agrees with plan.

## 2014-05-05 NOTE — Progress Notes (Signed)
BP 130/80 mmHg  Pulse 84  Temp(Src) 98.4 F (36.9 C) (Oral)  Wt 209 lb 8 oz (95.029 kg)   CC: back pain  Subjective:    Patient ID: Keith Bishop, male    DOB: 02/19/1971, 43 y.o.   MRN: [id number]  HPI: Keith Bishop is a 43 y.o. male presenting on 05/05/2014 for Back Pain   Several week h/o lower back pain. 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 No fevers, denies inciting trauma or injury, pain/numbness/weakness down legs, bowel/bladder accidents. Describes dull ache, currently 2/10 but at its worse 7-8/10 band like across lower back.  Self treating with aleve 1-2 tab nightly for last 3 weeks and 600mg  ibuprofen during the day.   Pain progressively worsens as day goes on, improves after a night of sleep.  LUMBAR SPINE - COMPLETE 4+ VIEW Comparison: 04/21/2006. Findings: Five lumbar type vertebral bodies are present. Alignment is anatomic. No pars defects. Vertebral body heights and intervertebral disc spaces are preserved. Lumbosacral junction appears normal. IMPRESSION: Normal lumbar spine radiographs. Original Report Authenticated By: Dereck Ligas, M.D.  Also endorses decreased libido recently, not necessarily trouble obtaining or maintaining erection  Relevant past medical, surgical, family and social history reviewed and updated as indicated.  Allergies and medications reviewed and updated. Current Outpatient Prescriptions on File Prior to Visit  Medication Sig  . ibuprofen (ADVIL,MOTRIN) 200 MG tablet Take 200 mg by mouth every 6 (six) hours as needed.  Marland Kitchen omeprazole (PRILOSEC OTC) 20 MG tablet Take 20 mg by mouth daily as needed.  Marland Kitchen acetaminophen (TYLENOL) 500 MG tablet Take 500 mg by mouth every 6 (six) hours as needed.    . fexofenadine (ALLEGRA) 180 MG tablet Take 180 mg by mouth daily.   No current facility-administered medications on file prior to visit.   Past Medical History  Diagnosis Date  . Vitiligo    . Personal history of tobacco use, presenting hazards to health   . Pure hyperglyceridemia   . Fatty liver disease, nonalcoholic   . Other malaise and fatigue   . Benign neoplasm of skin of trunk, except scrotum   . Anemia   . Lymphadenopathy     s/p cervical/supraclavicular biopsy 1997 and again 2006 and 2007, necrotizing granulomatous inflammation, possibly thought due to periodontal disease, again 03/2011 - lymphoid tissue, QNS for flow cytometry, eval by surg then onc at [facility] cancer center - no further intervention recommended  . Primary male hypogonadism     Past Surgical History  Procedure Laterality Date  . Lymphnode biopsy Right 06/12/1996    supraclav area-negative   . Lymphnode biopsy Right 03/04/05, 05/29/06    open cervical-negative (necrotizing granulomatous inflammation)  . Inguinal hernia repair Left 04/05/07    Dr. Brantley Stage  . Mouth surgery  9/11  . Ct chest/abd  03/2006    progression of LAD in sup mediastinum, persistent lymphadenopathy in neck/R supraclav, splenomegaly  . Colonoscopy/endoscopy  02/2011    overall normal - focal mild chronic gastritis, rectal serrated adenoma polyp rpt 5 yrs  . Ct chest/abd/pelvis  02/2011    moderate thoracic inlet, mild mediastinal LAD, extensive upper abd and retroperitoneal LAD, splenomegaly.  consider lymphoma  . Lymphnode biopsy Left 03/2011    core needle biopsy - supraclav area- lymphoid tissue, QNS for flow cytometry   Review of Systems Per HPI unless specifically indicated above    Objective:    BP 130/80 mmHg  Pulse 84  Temp(Src) 98.4  F (36.9 C) (Oral)  Wt 209 lb 8 oz (95.029 kg)  Physical Exam  Constitutional: He appears well-developed and well-nourished. No distress.  Musculoskeletal: He exhibits no edema.  No pain midline spine + paraspinous mm tenderness mid lumbar region bilaterally Neg SLR bilaterally. No pain with int/ext rotation at hip. Neg FABER. No pain at SIJ, GTB or sciatic notch bilaterally.  Neg  stork test Pain improves with extension at spine No lumbar scoliosis appreciated  Neurological: He has normal strength. No sensory deficit.  5/5 strength BLE  Nursing note and vitals reviewed.      Assessment & Plan:   Problem List Items Addressed This Visit    Primary male hypogonadism    Will recheck Testosterone when pt returns for fasting am labs prior to upcoming CPE and discuss results/treatment.    LBP (low back pain) - Primary    Of 3 wks duration without noted radiculopathy Anticipate he has uncomplicated lumbar strain. Treat with naprosyn 500mg  x 1 wk, robaxin, and encouraged continued walking. Also provided with exercises from [facility] pt advisor. Update if not improving - would consider rpt imaging and PT. Pt agrees with plan.    Relevant Medications      naproxen (NAPROSYN) tablet      methocarbamol (ROBAXIN) tablet       Follow up plan: Return as needed.

## 2014-05-05 NOTE — Progress Notes (Signed)
Pre visit review using our clinic review tool, if applicable. No additional management support is needed unless otherwise documented below in the visit note. 

## 2014-05-05 NOTE — Patient Instructions (Addendum)
I think you do have lumbar strain - treat with prescription anti inflammatory naprosyn twice daily for 1 week with food then as needed, muscle relaxant robaxin, and exercises provided today. If not improving with above update me for further evaluation. Good to see you today, call us with quesitons. Return at your convenience over next few months for physical, prior fasting for blood work.

## 2014-05-24 ENCOUNTER — Other Ambulatory Visit: Payer: Self-pay | Admitting: Family Medicine

## 2014-05-24 DIAGNOSIS — E781 Pure hyperglyceridemia: Secondary | ICD-10-CM

## 2014-05-24 DIAGNOSIS — K760 Fatty (change of) liver, not elsewhere classified: Secondary | ICD-10-CM

## 2014-05-24 DIAGNOSIS — E291 Testicular hypofunction: Secondary | ICD-10-CM

## 2014-05-24 DIAGNOSIS — R591 Generalized enlarged lymph nodes: Secondary | ICD-10-CM

## 2014-05-24 DIAGNOSIS — R718 Other abnormality of red blood cells: Secondary | ICD-10-CM

## 2014-05-29 ENCOUNTER — Other Ambulatory Visit (INDEPENDENT_AMBULATORY_CARE_PROVIDER_SITE_OTHER): Payer: 59

## 2014-05-29 DIAGNOSIS — K760 Fatty (change of) liver, not elsewhere classified: Secondary | ICD-10-CM

## 2014-05-29 DIAGNOSIS — R591 Generalized enlarged lymph nodes: Secondary | ICD-10-CM

## 2014-05-29 DIAGNOSIS — E781 Pure hyperglyceridemia: Secondary | ICD-10-CM

## 2014-05-29 DIAGNOSIS — R718 Other abnormality of red blood cells: Secondary | ICD-10-CM

## 2014-05-29 DIAGNOSIS — E291 Testicular hypofunction: Secondary | ICD-10-CM

## 2014-05-29 LAB — COMPREHENSIVE METABOLIC PANEL
ALBUMIN: 3.8 g/dL (ref 3.5–5.2)
ALT: 28 U/L (ref 0–53)
AST: 14 U/L (ref 0–37)
Alkaline Phosphatase: 92 U/L (ref 39–117)
BUN: 15 mg/dL (ref 6–23)
CALCIUM: 8.9 mg/dL (ref 8.4–10.5)
CHLORIDE: 104 meq/L (ref 96–112)
CO2: 27 mEq/L (ref 19–32)
Creatinine, Ser: 1.1 mg/dL (ref 0.4–1.5)
GFR: 76.28 mL/min (ref >60.00)
GLUCOSE: 105 mg/dL — AB (ref 70–99)
POTASSIUM: 4.5 meq/L (ref 3.5–5.1)
Sodium: 141 mEq/L (ref 135–145)
Total Bilirubin: 1.1 mg/dL (ref 0.2–1.2)
Total Protein: 6.7 g/dL (ref 6.0–8.3)

## 2014-05-29 LAB — CBC WITH DIFFERENTIAL/PLATELET
BASOS ABS: 0.1 10*3/uL (ref 0.0–0.1)
Basophils Relative: 0.7 % (ref 0.0–3.0)
EOS ABS: 0.2 10*3/uL (ref 0.0–0.7)
EOS PCT: 2.6 % (ref 0.0–5.0)
HCT: 38.1 % — ABNORMAL LOW (ref 39.0–52.0)
Hemoglobin: 12.5 g/dL — ABNORMAL LOW (ref 13.0–17.0)
Lymphocytes Relative: 19 % (ref 12.0–46.0)
Lymphs Abs: 1.5 10*3/uL (ref 0.7–4.0)
MCHC: 32.7 g/dL (ref 30.0–36.0)
MCV: 76.8 fl — AB (ref 78.0–100.0)
MONO ABS: 0.5 10*3/uL (ref 0.1–1.0)
Monocytes Relative: 5.8 % (ref 3.0–12.0)
NEUTROS PCT: 71.9 % (ref 43.0–77.0)
Neutro Abs: 5.6 10*3/uL (ref 1.4–7.7)
PLATELETS: 306 10*3/uL (ref 150.0–400.0)
RBC: 4.96 Mil/uL (ref 4.22–5.81)
RDW: 13.4 % (ref 11.5–15.5)
WBC: 7.8 10*3/uL (ref 4.0–10.5)

## 2014-05-29 LAB — LIPID PANEL
CHOL/HDL RATIO: 8
CHOLESTEROL: 172 mg/dL (ref 0–200)
HDL: 20.4 mg/dL — ABNORMAL LOW (ref >39.00)
LDL Cholesterol: 121 mg/dL — ABNORMAL HIGH (ref 0–99)
NonHDL: 151.6
TRIGLYCERIDES: 155 mg/dL — AB (ref 0.0–149.0)
VLDL: 31 mg/dL (ref 0.0–40.0)

## 2014-05-29 LAB — TESTOSTERONE: Testosterone: 285.3 ng/dL — ABNORMAL LOW (ref 300.00–890.00)

## 2014-05-29 LAB — IBC PANEL
IRON: 35 ug/dL — AB (ref 42–165)
Saturation Ratios: 15 % — ABNORMAL LOW (ref 20.0–50.0)
Transferrin: 166.3 mg/dL — ABNORMAL LOW (ref 212.0–360.0)

## 2014-05-29 LAB — FERRITIN: FERRITIN: 201.8 ng/mL (ref 22.0–322.0)

## 2014-05-29 LAB — SEDIMENTATION RATE: SED RATE: 70 mm/h — AB (ref 0–22)

## 2014-05-29 LAB — TSH: TSH: 0.88 u[IU]/mL (ref 0.35–4.50)

## 2014-05-29 LAB — HIGH SENSITIVITY CRP: CRP, High Sensitivity: 62.42 mg/L — ABNORMAL HIGH (ref 0.000–5.000)

## 2014-06-06 ENCOUNTER — Encounter: Payer: Self-pay | Admitting: Family Medicine

## 2014-06-06 ENCOUNTER — Ambulatory Visit (INDEPENDENT_AMBULATORY_CARE_PROVIDER_SITE_OTHER): Payer: 59 | Admitting: Family Medicine

## 2014-06-06 VITALS — BP 128/82 | HR 96 | Temp 98.0°F | Ht 76.0 in | Wt 206.8 lb

## 2014-06-06 DIAGNOSIS — R7982 Elevated C-reactive protein (CRP): Secondary | ICD-10-CM

## 2014-06-06 DIAGNOSIS — Z0000 Encounter for general adult medical examination without abnormal findings: Secondary | ICD-10-CM

## 2014-06-06 DIAGNOSIS — K760 Fatty (change of) liver, not elsewhere classified: Secondary | ICD-10-CM

## 2014-06-06 DIAGNOSIS — Z23 Encounter for immunization: Secondary | ICD-10-CM

## 2014-06-06 DIAGNOSIS — R591 Generalized enlarged lymph nodes: Secondary | ICD-10-CM

## 2014-06-06 NOTE — Addendum Note (Signed)
Addended by: Royann Shivers A on: 06/06/2014 02:56 PM   Modules accepted: Orders

## 2014-06-06 NOTE — Progress Notes (Signed)
Pre visit review using our clinic review tool, if applicable. No additional management support is needed unless otherwise documented below in the visit note. 

## 2014-06-06 NOTE — Progress Notes (Signed)
BP 128/82 mmHg  Pulse 96  Temp(Src) 98 F (36.7 C) (Oral)  Ht 6\' 4"  (1.93 m)  Wt 206 lb 12 oz (93.781 kg)  BMI 25.18 kg/m2   CC: CPE  Subjective:    Patient ID: Keith Bishop, male    DOB: 23-Dec-1970, 43 y.o.   MRN: [id number]  HPI: Keith Bishop is a 43 y.o. male presenting on 06/06/2014 for Annual Exam   Working on weight loss - healthier snacks and increased activity.   Preventative: colonoscopy/endoscopy Date: 02/2011 overall normal - focal mild chronic gastritis, rectal serrated adenoma polyp rpt 5 yrs. Denies BM changes or blood in stool.  Flu shot - today Td 2011 Seat belt use discussed Sunscreen use discussed. Denies changing spots on skin.  Caffeine: 3 cups/day Remarried-6/06, lives with wife Occ: mailman Activity: walking regularly at work and home Diet: good water, fruits/vegetables daily  Relevant past medical, surgical, family and social history reviewed and updated as indicated. Interim medical history since our last visit reviewed. Allergies and medications reviewed and updated.  Current Outpatient Prescriptions on File Prior to Visit  Medication Sig  . acetaminophen (TYLENOL) 500 MG tablet Take 500 mg by mouth as needed.   Marland Kitchen ibuprofen (ADVIL,MOTRIN) 200 MG tablet Take 200 mg by mouth as needed.   . methocarbamol (ROBAXIN) 500 MG tablet Take 1 tablet (500 mg total) by mouth 3 (three) times daily as needed for muscle spasms.  . naproxen (NAPROSYN) 500 MG tablet Take one po bid x 1 week then prn pain, take with food  . omeprazole (PRILOSEC OTC) 20 MG tablet Take 20 mg by mouth daily as needed.  . fexofenadine (ALLEGRA) 180 MG tablet Take 180 mg by mouth daily.   No current facility-administered medications on file prior to visit.    Review of Systems  Constitutional: Negative for fever, chills, activity change, appetite change, fatigue and unexpected weight change.  HENT: Negative for hearing loss.   Eyes: Negative for visual disturbance.    Respiratory: Negative for cough, chest tightness, shortness of breath and wheezing.   Cardiovascular: Negative for chest pain, palpitations and leg swelling.  Gastrointestinal: Negative for nausea, vomiting, abdominal pain, diarrhea, constipation, blood in stool and abdominal distention.  Genitourinary: Negative for hematuria and difficulty urinating.  Musculoskeletal: Negative for myalgias, arthralgias and neck pain.  Skin: Negative for rash.  Neurological: Negative for dizziness, seizures, syncope and headaches.  Hematological: Negative for adenopathy. Does not bruise/bleed easily.  Psychiatric/Behavioral: Negative for dysphoric mood. The patient is not nervous/anxious.    Per HPI unless specifically indicated above     Objective:    BP 128/82 mmHg  Pulse 96  Temp(Src) 98 F (36.7 C) (Oral)  Ht 6\' 4"  (1.93 m)  Wt 206 lb 12 oz (93.781 kg)  BMI 25.18 kg/m2  Wt Readings from Last 3 Encounters:  06/06/14 206 lb 12 oz (93.781 kg)  05/05/14 209 lb 8 oz (95.029 kg)  10/24/13 217 lb 8 oz (98.657 kg)    Physical Exam  Constitutional: He is oriented to person, place, and time. He appears well-developed and well-nourished. No distress.  HENT:  Head: Normocephalic and atraumatic.  Right Ear: Hearing, tympanic membrane, external ear and ear canal normal.  Left Ear: Hearing, tympanic membrane, external ear and ear canal normal.  Nose: Nose normal.  Mouth/Throat: Uvula is midline, oropharynx is clear and moist and mucous membranes are normal. No oropharyngeal exudate, posterior oropharyngeal edema or posterior oropharyngeal erythema.  Eyes: Conjunctivae and EOM  are normal. Pupils are equal, round, and reactive to light. No scleral icterus.  Neck: Normal range of motion. Neck supple. No thyromegaly present.  Cardiovascular: Normal rate, regular rhythm, normal heart sounds and intact distal pulses.   No murmur heard. Pulses:      Radial pulses are 2+ on the right side, and 2+ on the left  side.  Pulmonary/Chest: Effort normal and breath sounds normal. No respiratory distress. He has no wheezes. He has no rales.  Abdominal: Soft. Bowel sounds are normal. He exhibits no distension and no mass. There is no tenderness. There is no rebound and no guarding.  Musculoskeletal: Normal range of motion. He exhibits no edema.  Lymphadenopathy:    He has no cervical adenopathy.  No significant LAD appreciated today (cervical or supraclavicular regions)  Neurological: He is alert and oriented to person, place, and time.  CN grossly intact, station and gait intact  Skin: Skin is warm and dry. No rash noted.  Psychiatric: He has a normal mood and affect. His behavior is normal. Judgment and thought content normal.  Nursing note and vitals reviewed.  Results for orders placed or performed in visit on 05/29/14  Lipid panel  Result Value Ref Range   Cholesterol 172 0 - 200 mg/dL   Triglycerides 155.0 (H) 0.0 - 149.0 mg/dL   HDL 20.40 (L) >39.00 mg/dL   VLDL 31.0 0.0 - 40.0 mg/dL   LDL Cholesterol 121 (H) 0 - 99 mg/dL   Total CHOL/HDL Ratio 8    NonHDL 151.60   Comprehensive metabolic panel  Result Value Ref Range   Sodium 141 135 - 145 mEq/L   Potassium 4.5 3.5 - 5.1 mEq/L   Chloride 104 96 - 112 mEq/L   CO2 27 19 - 32 mEq/L   Glucose, Bld 105 (H) 70 - 99 mg/dL   BUN 15 6 - 23 mg/dL   Creatinine, Ser 1.1 0.4 - 1.5 mg/dL   Total Bilirubin 1.1 0.2 - 1.2 mg/dL   Alkaline Phosphatase 92 39 - 117 U/L   AST 14 0 - 37 U/L   ALT 28 0 - 53 U/L   Total Protein 6.7 6.0 - 8.3 g/dL   Albumin 3.8 3.5 - 5.2 g/dL   Calcium 8.9 8.4 - 10.5 mg/dL   GFR 76.28 >60.00 mL/min  TSH  Result Value Ref Range   TSH 0.88 0.35 - 4.50 uIU/mL  CBC with Differential  Result Value Ref Range   WBC 7.8 4.0 - 10.5 K/uL   RBC 4.96 4.22 - 5.81 Mil/uL   Hemoglobin 12.5 (L) 13.0 - 17.0 g/dL   HCT 38.1 (L) 39.0 - 52.0 %   MCV 76.8 (L) 78.0 - 100.0 fl   MCHC 32.7 30.0 - 36.0 g/dL   RDW 13.4 11.5 - 15.5 %    Platelets 306.0 150.0 - 400.0 K/uL   Neutrophils Relative % 71.9 43.0 - 77.0 %   Lymphocytes Relative 19.0 12.0 - 46.0 %   Monocytes Relative 5.8 3.0 - 12.0 %   Eosinophils Relative 2.6 0.0 - 5.0 %   Basophils Relative 0.7 0.0 - 3.0 %   Neutro Abs 5.6 1.4 - 7.7 K/uL   Lymphs Abs 1.5 0.7 - 4.0 K/uL   Monocytes Absolute 0.5 0.1 - 1.0 K/uL   Eosinophils Absolute 0.2 0.0 - 0.7 K/uL   Basophils Absolute 0.1 0.0 - 0.1 K/uL  Ferritin  Result Value Ref Range   Ferritin 201.8 22.0 - 322.0 ng/mL  IBC panel  Result  Value Ref Range   Iron 35 (L) 42 - 165 ug/dL   Transferrin 166.3 (L) 212.0 - 360.0 mg/dL   Saturation Ratios 15.0 (L) 20.0 - 50.0 %  Sedimentation rate  Result Value Ref Range   Sed Rate 70 (H) 0 - 22 mm/hr  High sensitivity CRP  Result Value Ref Range   CRP, High Sensitivity 62.420 (H) 0.000 - 5.000 mg/L  Testosterone  Result Value Ref Range   Testosterone 285.30 (L) 300.00 - 890.00 ng/dL      Assessment & Plan:   Problem List Items Addressed This Visit    Lymphadenopathy    Stable.    Iron disorder    Will try and touch base with onc he saw earlier this year.    Health maintenance examination - Primary    Preventative protocols reviewed and updated unless pt declined. Discussed healthy diet and lifestyle.     Fatty liver    Stable LFTs, stable FLP    Chronic inflammatory state with iron disorder    Unrevealing workup including multiple CT scans (generalized LAD), labwork, colonoscopy. Will try and touch base with [facility] onc to discuss abnormal iron panel.        Follow up plan: Return in about 1 year (around 06/07/2015), or as needed, for annual exam, prior fasting for blood work.

## 2014-06-06 NOTE — Assessment & Plan Note (Addendum)
Unrevealing workup including multiple CT scans (generalized LAD), labwork, colonoscopy. Will try and touch base with [facility] onc to discuss abnormal iron panel.

## 2014-06-06 NOTE — Patient Instructions (Addendum)
Flu shot today Start multivitamin with iron. Return as needed or in 1 year for next physical.

## 2014-06-06 NOTE — Assessment & Plan Note (Signed)
Stable

## 2014-06-06 NOTE — Assessment & Plan Note (Signed)
Stable LFTs, stable FLP

## 2014-06-06 NOTE — Assessment & Plan Note (Signed)
Preventative protocols reviewed and updated unless pt declined. Discussed healthy diet and lifestyle.  

## 2014-06-06 NOTE — Assessment & Plan Note (Signed)
Will try and touch base with onc he saw earlier this year.

## 2014-07-14 ENCOUNTER — Telehealth: Payer: Self-pay

## 2014-07-14 DIAGNOSIS — Z3009 Encounter for other general counseling and advice on contraception: Secondary | ICD-10-CM

## 2014-07-14 NOTE — Telephone Encounter (Signed)
Pt was seen 06/06/14 for CPX and forgot to ask about getting referral for vasectomy; pt also wants to know who will prescribe Cialis after vasectomy. Pt request cb. [place]

## 2014-07-14 NOTE — Telephone Encounter (Signed)
Referral placed.  He can discuss cialis with myself or urology

## 2014-11-05 ENCOUNTER — Ambulatory Visit (INDEPENDENT_AMBULATORY_CARE_PROVIDER_SITE_OTHER): Payer: 59 | Admitting: Primary Care

## 2014-11-05 ENCOUNTER — Encounter: Payer: Self-pay | Admitting: Primary Care

## 2014-11-05 VITALS — BP 138/82 | HR 58 | Temp 98.1°F | Ht 76.0 in | Wt 200.1 lb

## 2014-11-05 DIAGNOSIS — T148 Other injury of unspecified body region: Secondary | ICD-10-CM | POA: Diagnosis not present

## 2014-11-05 DIAGNOSIS — W57XXXA Bitten or stung by nonvenomous insect and other nonvenomous arthropods, initial encounter: Secondary | ICD-10-CM

## 2014-11-05 NOTE — Progress Notes (Signed)
Subjective:    Patient ID: Keith Bishop, male    DOB: 01-07-1971, 44 y.o.   MRN: [id number]  HPI  Keith Bishop is a 44 year old male who presents today with a chief complaint of itching and redness to right hand (to webbing between 4th and 5th digit) after finding a tick last Wednesday (one week ago). The tick was attached, small, and entirely removed on Wednesday. He denies pain in joints, nausea, dizziness, headaches, rash[place] often without bug spray or wearing long pants.  Review of Systems  Respiratory: Negative for shortness of breath.   Cardiovascular: Negative for chest pain and palpitations.  Musculoskeletal: Negative for joint swelling and arthralgias.  Skin: Positive for color change.  Neurological: Negative for dizziness, weakness and headaches.       Past Medical History  Diagnosis Date  . Vitiligo   . Personal history of tobacco use, presenting hazards to health   . Pure hyperglyceridemia   . Fatty liver disease, nonalcoholic   . Other malaise and fatigue   . Benign neoplasm of skin of trunk, except scrotum   . Anemia   . Lymphadenopathy     s/p cervical/supraclavicular biopsy 1997 and again 2006 and 2007, necrotizing granulomatous inflammation, possibly thought due to periodontal disease, again 03/2011 - lymphoid tissue, QNS for flow cytometry, eval by surg then onc at [facility] - no further intervention recommended  . Primary male hypogonadism     History   Social History  . Marital Status: Married    Spouse Name: N/A  . Number of Children: 2  . Years of Education: N/A   Occupational History  . Mail carrier [employer]   Social History Main Topics  . Smoking status: Current Every Day Smoker    Last Attempt to Quit: 04/28/2009  . Smokeless tobacco: Never Used  . Alcohol Use: No  . Drug Use: No  . Sexual Activity: Not on file   Other Topics Concern  . Not on file   Social History Narrative     Caffeine: 3 cups/day   Remarried-6/06, lives with wife   Occ: mailman   Activity: walking regularly at work and home   Diet: good water, fruits/vegetables daily    Past Surgical History  Procedure Laterality Date  . Lymphnode biopsy Right 06/12/1996    supraclav area-negative   . Lymphnode biopsy Right 03/04/05, 05/29/06    open cervical-negative (necrotizing granulomatous inflammation)  . Inguinal hernia repair Left 04/05/07    Dr. Brantley Stage  . Mouth surgery  9/11  . Ct chest/abd  03/2006    progression of LAD in sup mediastinum, persistent lymphadenopathy in neck/R supraclav, splenomegaly  . Colonoscopy/endoscopy  02/2011    overall normal - focal mild chronic gastritis, rectal serrated adenoma polyp rpt 5 yrs  . Ct chest/abd/pelvis  02/2011    moderate thoracic inlet, mild mediastinal LAD, extensive upper abd and retroperitoneal LAD, splenomegaly.  consider lymphoma  . Lymphnode biopsy Left 03/2011    core needle biopsy - supraclav area- lymphoid tissue, QNS for flow cytometry    Family History  Problem Relation Age of Onset  . Heart attack Father 15    deceased  . Hypertension Mother   . Diabetes Mother   . Hypertension Maternal Grandmother   . Irritable bowel syndrome Maternal Grandmother   . Hypertension Sister   . Diabetes Sister   . Obesity Sister   . Alcohol  abuse Maternal Uncle   . Gestational diabetes Sister   . Colon cancer Neg Hx   . Cancer Maternal Grandfather 92    colon    No Known Allergies  Current Outpatient Prescriptions on File Prior to Visit  Medication Sig Dispense Refill  . acetaminophen (TYLENOL) 500 MG tablet Take 500 mg by mouth as needed.     Marland Kitchen ibuprofen (ADVIL,MOTRIN) 200 MG tablet Take 200 mg by mouth as needed.     . fexofenadine (ALLEGRA) 180 MG tablet Take 180 mg by mouth daily.    . methocarbamol (ROBAXIN) 500 MG tablet Take 1 tablet (500 mg total) by mouth 3 (three) times daily as needed for muscle spasms. (Patient not taking: Reported  on 11/05/2014) 40 tablet 0  . naproxen (NAPROSYN) 500 MG tablet Take one po bid x 1 week then prn pain, take with food (Patient not taking: Reported on 11/05/2014) 50 tablet 0  . omeprazole (PRILOSEC OTC) 20 MG tablet Take 20 mg by mouth daily as needed.     No current facility-administered medications on file prior to visit.    BP 138/82 mmHg  Pulse 58  Temp(Src) 98.1 F (36.7 C) (Oral)  Ht 6\' 4"  (1.93 m)  Wt 200 lb 1.9 oz (90.774 kg)  BMI 24.37 kg/m2  SpO2 97%     Objective:   Physical Exam  Constitutional: He is oriented to person, place, and time. He appears well-developed. He does not appear ill.  Neck: Neck supple.  Cardiovascular: Normal rate and regular rhythm.   Pulmonary/Chest: Effort normal and breath sounds normal.  Musculoskeletal: Normal range of motion. He exhibits no edema.  Lymphadenopathy:    He has no cervical adenopathy.  Neurological: He is alert and oriented to person, place, and time. Coordination normal.  Skin: Skin is warm and dry.  Mild erythema at site of bite to webbing between 4th and 5th digits. No significant rash.           Assessment & Plan:  Tick Bite:  Mild erythremia, minimal pruritis to site of bite. No change in ROM or joint swelling. No other rashes present. Handout provided to patient regarding s/s of [place] and to notify me if he develops these signs. Hydrocortisone cream OTC twice daily PRN itching. Follow up as needed.   10 min spent during today's visit.

## 2014-11-05 NOTE — Progress Notes (Signed)
Pre visit review using our clinic review tool, if applicable. No additional management support is needed unless otherwise documented below in the visit note. 

## 2014-11-05 NOTE — Patient Instructions (Signed)
The redness and inflammation should improve over the next week. Try applying Hydrocortisone cream (over the counter) to your hand twice daily. Be sure to wear bug spray and long shirts and pants if out in the woods. Watch out for growing rash with "bulls eye" appearance; dizziness, joint aches; headaches; fevers; nausea. Call if you develop any of those symptoms in the next several weeks. It was nice meeting you!

## 2016-01-05 ENCOUNTER — Encounter: Payer: Self-pay | Admitting: *Deleted

## 2016-01-29 ENCOUNTER — Encounter: Payer: Self-pay | Admitting: Internal Medicine

## 2016-08-12 ENCOUNTER — Emergency Department (HOSPITAL_COMMUNITY): Payer: 59

## 2016-08-12 ENCOUNTER — Emergency Department (HOSPITAL_COMMUNITY)
Admission: EM | Admit: 2016-08-12 | Discharge: 2016-08-12 | Disposition: A | Discharge status: Home / Self Care | Payer: 59 | Attending: Emergency Medicine | Admitting: Emergency Medicine

## 2016-08-12 ENCOUNTER — Encounter (HOSPITAL_COMMUNITY): Payer: Self-pay | Admitting: Emergency Medicine

## 2016-08-12 ENCOUNTER — Ambulatory Visit: Payer: 59 | Admitting: Family Medicine

## 2016-08-12 DIAGNOSIS — R079 Chest pain, unspecified: Secondary | ICD-10-CM | POA: Diagnosis not present

## 2016-08-12 DIAGNOSIS — F17200 Nicotine dependence, unspecified, uncomplicated: Secondary | ICD-10-CM | POA: Insufficient documentation

## 2016-08-12 DIAGNOSIS — R42 Dizziness and giddiness: Secondary | ICD-10-CM | POA: Insufficient documentation

## 2016-08-12 LAB — CBC
HEMATOCRIT: 44.3 % (ref 39.0–52.0)
HEMOGLOBIN: 15.7 g/dL (ref 13.0–17.0)
MCH: 28.8 pg (ref 26.0–34.0)
MCHC: 35.4 g/dL (ref 30.0–36.0)
MCV: 81.1 fL (ref 78.0–100.0)
Platelets: 152 10*3/uL (ref 150–400)
RBC: 5.46 MIL/uL (ref 4.22–5.81)
RDW: 12.5 % (ref 11.5–15.5)
WBC: 6 10*3/uL (ref 4.0–10.5)

## 2016-08-12 LAB — I-STAT TROPONIN, ED: TROPONIN I, POC: 0 ng/mL (ref 0.00–0.08)

## 2016-08-12 LAB — BASIC METABOLIC PANEL
ANION GAP: 6 (ref 5–15)
BUN: 15 mg/dL (ref 6–20)
CALCIUM: 9.2 mg/dL (ref 8.9–10.3)
CHLORIDE: 112 mmol/L — AB (ref 101–111)
CO2: 24 mmol/L (ref 22–32)
Creatinine, Ser: 1.07 mg/dL (ref 0.61–1.24)
GFR calc non Af Amer: >60 mL/min (ref >60)
Glucose, Bld: 104 mg/dL — ABNORMAL HIGH (ref 65–99)
POTASSIUM: 4.1 mmol/L (ref 3.5–5.1)
Sodium: 142 mmol/L (ref 135–145)

## 2016-08-12 MED ORDER — SODIUM CHLORIDE 0.9 % IV BOLUS (SEPSIS)
1000.0000 mL | Freq: Once | INTRAVENOUS | Status: AC
  Administered 2016-08-12: 1000 mL via INTRAVENOUS

## 2016-08-12 NOTE — Discharge Instructions (Signed)
All of your lab work and imaging has been normal today. Please follow-up with her primary care doctor in regards to today's visit. Return to ED if he develops exertional chest pain, worsening shortness of breath, worsening dizziness or for any reason.

## 2016-08-12 NOTE — ED Notes (Signed)
Patient is A+Ox4

## 2016-08-12 NOTE — ED Triage Notes (Signed)
Pt reports CP, SOB, and dizziness that began last night. Pt reports it feels like anxiety, but has lasted longer than usual.

## 2016-08-12 NOTE — ED Provider Notes (Signed)
[facility] DEPT Provider Note   CSN: [id number] Arrival date & time: 08/12/16  1139     History   Chief Complaint Chief Complaint  Patient presents with  . Chest Pain    HPI Keith Bishop is a 46 y.o. male.  46 year old Caucasian male with no sig pmh that presents to the ED today with complaints of chest pain, shortness of breath, dizziness. Patient complains of left-sided chest pain that he describes as sharp that began approximately 10 PM last night when he was laying in bed. Patient took 2 aspirin and went to sleep the pain subsided. Woke up this morning with no pain however stated he felt slightly dizzy. He took 2 more aspirin and his dizziness subsided. Patient is unsure of how long the chest pain episode lasted. He endorse being mildly short of breath with the chest pain. The chest pain did not radiate. Not associated with any diaphoresis, nausea, emesis. With nonexertional. Not pleuritic in nature. Patient states that the dizziness felt like the room was spinning. He endorses mild lightheadedness. Denies any tunnel vision. Denies any syncopal episode. States that riding in the car made the dizziness worse and it came in waves. Patient denies any chest pain, shortness breath, dizziness at this time. Patient has no cardiac history. Denies any early cardiac family history. The patient is a pack-a-day smoker. He denies any history of DVT, lower extremity edema, calf tenderness, prolonged immobilizations, recent hospitalizations/surgeries. Patient states he thought it was anxiety at first but has lasted longer than usual. He denies any fever, chills, headache, vision changes, abdominal pain, nausea, emesis, urinary symptoms, change in bowel habits, paresthesias.      Past Medical History:  Diagnosis Date  . Anemia   . Benign neoplasm of skin of trunk, except scrotum   . Fatty liver disease, nonalcoholic   . Lymphadenopathy    s/p cervical/supraclavicular biopsy 1997 and again  2006 and 2007, necrotizing granulomatous inflammation, possibly thought due to periodontal disease, again 03/2011 - lymphoid tissue, QNS for flow cytometry, eval by surg then onc at [facility] cancer center - no further intervention recommended  . Other malaise and fatigue   . Personal history of tobacco use, presenting hazards to health   . Primary male hypogonadism   . Pure hyperglyceridemia   . Vitiligo     Patient Active Problem List   Diagnosis Date Noted  . Health maintenance examination 06/06/2014  . Chronic inflammatory state with iron disorder 06/06/2014  . Dyspnea on exertion 07/05/2013  . Heart palpitations 03/07/2012  . Primary male hypogonadism   . Lymphadenopathy 02/08/2011  . LBP (low back pain) 01/28/2011  . Iron disorder 01/19/2011  . Vitiligo 03/03/2009  . Fatty liver 11/03/2006    Past Surgical History:  Procedure Laterality Date  . colonoscopy/endoscopy  02/2011   overall normal - focal mild chronic gastritis, rectal serrated adenoma polyp rpt 5 yrs  . CT chest/abd  03/2006   progression of LAD in sup mediastinum, persistent lymphadenopathy in neck/R supraclav, splenomegaly  . CT chest/abd/pelvis  02/2011   moderate thoracic inlet, mild mediastinal LAD, extensive upper abd and retroperitoneal LAD, splenomegaly.  consider lymphoma  . INGUINAL HERNIA REPAIR Left 04/05/07   Dr. Brantley Stage  . Lymphnode biopsy Right 06/12/1996   supraclav area-negative   . Lymphnode biopsy Right 03/04/05, 05/29/06   open cervical-negative (necrotizing granulomatous inflammation)  . lymphnode biopsy Left 03/2011   core needle biopsy - supraclav area- lymphoid tissue, QNS for flow cytometry  . MOUTH  SURGERY  9/11       Home Medications    Prior to Admission medications   Medication Sig Start Date End Date Taking? Authorizing Provider  acetaminophen (TYLENOL) 500 MG tablet Take 500 mg by mouth as needed.     Historical Provider, MD  fexofenadine (ALLEGRA) 180 MG tablet Take 180 mg by  mouth daily.    Historical Provider, MD  ibuprofen (ADVIL,MOTRIN) 200 MG tablet Take 200 mg by mouth as needed.     Historical Provider, MD  methocarbamol (ROBAXIN) 500 MG tablet Take 1 tablet (500 mg total) by mouth 3 (three) times daily as needed for muscle spasms. Patient not taking: Reported on 11/05/2014 05/05/14   Ria Bush, MD  naproxen (NAPROSYN) 500 MG tablet Take one po bid x 1 week then prn pain, take with food Patient not taking: Reported on 11/05/2014 05/05/14   Ria Bush, MD  omeprazole (PRILOSEC OTC) 20 MG tablet Take 20 mg by mouth daily as needed.    Historical Provider, MD    Family History Family History  Problem Relation Age of Onset  . Heart attack Father 30    deceased  . Hypertension Mother   . Diabetes Mother   . Hypertension Maternal Grandmother   . Irritable bowel syndrome Maternal Grandmother   . Hypertension Sister   . Diabetes Sister   . Obesity Sister   . Alcohol abuse Maternal Uncle   . Gestational diabetes Sister   . Colon cancer Maternal Grandfather 32    Social History Social History  Substance Use Topics  . Smoking status: Current Every Day Smoker    Last attempt to quit: 04/28/2009  . Smokeless tobacco: Never Used  . Alcohol use No     Allergies   Patient has no known allergies.   Review of Systems Review of Systems  Constitutional: Negative for chills and fever.  HENT: Negative for congestion.   Eyes: Negative for photophobia and visual disturbance.  Respiratory: Positive for shortness of breath. Negative for cough and wheezing.   Cardiovascular: Positive for chest pain. Negative for palpitations and leg swelling.  Gastrointestinal: Negative for abdominal pain, diarrhea, nausea and vomiting.  Genitourinary: Negative for dysuria, frequency, hematuria and urgency.  Musculoskeletal: Negative for back pain.  Skin: Negative.   Neurological: Positive for dizziness and light-headedness. Negative for syncope, speech difficulty,  weakness, numbness and headaches.  All other systems reviewed and are negative.    Physical Exam Updated Vital Signs BP 118/83 (BP Location: Left Arm)   Pulse (!) 54   Temp 98.6 F (37 C) (Oral)   Resp 14   Ht 6\' 2"  (1.88 m)   Wt 90.7 kg   SpO2 99%   BMI 25.68 kg/m   Physical Exam  Constitutional: He is oriented to person, place, and time. He appears well-developed and well-nourished. No distress.  Patient is nontoxic appearing. He is in no acute distress. Denies any chest pain or shortness of breath at this time.  HENT:  Head: Normocephalic and atraumatic.  Mouth/Throat: Oropharynx is clear and moist.  Eyes: Conjunctivae and EOM are normal. Pupils are equal, round, and reactive to light. Right eye exhibits no discharge. Left eye exhibits no discharge. No scleral icterus.  Neck: Normal range of motion. Neck supple. No thyromegaly present.  Cardiovascular: Normal rate, regular rhythm, normal heart sounds and intact distal pulses.  Exam reveals no gallop and no friction rub.   No murmur heard. Pulmonary/Chest: Effort normal and breath sounds normal. No respiratory distress.  He exhibits no tenderness.  Abdominal: Soft. Bowel sounds are normal. He exhibits no distension. There is no tenderness. There is no rebound and no guarding.  Musculoskeletal: Normal range of motion.  No lower extremity edema or calf tenderness.  Lymphadenopathy:    He has no cervical adenopathy.  Neurological: He is alert and oriented to person, place, and time.  The patient is alert, attentive, and oriented x 3. Speech is clear. Cranial nerve II-VII grossly intact. Negative pronator drift. Sensation intact. Strength 5/5 in all extremities. Reflexes 2+ and symmetric at biceps, triceps, knees, and ankles. Rapid alternating movement and fine finger movements intact. Romberg is absent. Posture and gait normal.  Dix hallpike maneuver did not reproduce dizziness or any nystagmus.   Skin: Skin is warm and dry.  Capillary refill takes less than 2 seconds.  Nursing note and vitals reviewed.    ED Treatments / Results  Labs (all labs ordered are listed, but only abnormal results are displayed) Labs Reviewed  BASIC METABOLIC PANEL - Abnormal; Notable for the following:       Result Value   Chloride 112 (*)    Glucose, Bld 104 (*)    All other components within normal limits  CBC  I-STAT TROPOININ, ED    EKG  EKG Interpretation None       Radiology Dg Chest 2 View  Result Date: 08/12/2016 CLINICAL DATA:  Chest pain, left-sided since this morning EXAM: CHEST  2 VIEW COMPARISON:  07/04/2013 FINDINGS: EKG leads create artifact over the chest. Normal heart size and mediastinal contours. No acute infiltrate or edema. No effusion or pneumothorax. No acute osseous findings. Right supraclavicular clips. IMPRESSION: No evidence of active disease. Electronically Signed   By: Monte Fantasia M.D.   On: 08/12/2016 12:40    Procedures Procedures (including critical care time)  Medications Ordered in ED Medications  sodium chloride 0.9 % bolus 1,000 mL (1,000 mLs Intravenous New Bag/Given 08/12/16 1236)     Initial Impression / Assessment and Plan / ED Course  I have reviewed the triage vital signs and the nursing notes.  Pertinent labs & imaging results that were available during my care of the patient were reviewed by me and considered in my medical decision making (see chart for details).     Patient presents to the ED with complaints of chest pain, shortness of breath, dizziness. Patient states the chest pain happened at 10 PM last night. Denies any pain since then. States that he was having a little bit of dizziness this morning when he woke up. Running the car make the dizziness worse and describes that the room was spinning. . States that it felt like anxiety. EKG showed normal sinus rhythm. Chest x-ray was unremarkable. Troponin was negative. Given the patient's symptoms were greater than  12 hours ago did not feel back at delta troponins indicated. Has not had any cp or sob since last night. Heart pathway score is 2. Patient is PERC negative and wells 0. Orthostatics were normal in the ED. Patient's vital signs are normal. Patient denies any episodes of chest pain, shortness breath, dizziness, ED. Has been ambulating with normal gait. His focal neuro deficits. Denies any headache or vision changes. Dix Hallpike's maneuver is normal without any nystagmus. Could be peripheral vertigo. No symptoms in the ED.  Patient is to be discharged with recommendation to follow up with PCP in regards to today's hospital visit. Chest pain is not likely of cardiac or pulmonary etiology d/t presentation,  perc negative, VSS, no tracheal deviation, no JVD or new murmur, RRR, breath sounds equal bilaterally, EKG without acute abnormalities, negative troponin, and negative CXR. Pt has been advised to return to the ED is CP becomes exertional, associated with diaphoresis or nausea, radiates to left jaw/arm, worsens or becomes concerning in any way. Pt feels improved and is ready for discharge. Pt appears reliable for follow up and is agreeable to discharge. Pt is hemodynamically stable, in NAD, & able to ambulate in the ED. Pain has been managed & has no complaints prior to dc. Pt is comfortable with above plan and is stable for discharge at this time. All questions were answered prior to disposition. Strict return precautions for f/u to the ED were discussed.     Final Clinical Impressions(s) / ED Diagnoses   Final diagnoses:  Nonspecific chest pain  Dizziness    New Prescriptions New Prescriptions   No medications on file     Doristine Devoid, PA-C 08/12/16 1347    Quintella Reichert, MD 08/12/16 1757

## 2017-06-16 ENCOUNTER — Ambulatory Visit (INDEPENDENT_AMBULATORY_CARE_PROVIDER_SITE_OTHER): Payer: 59 | Admitting: Family Medicine

## 2017-06-16 ENCOUNTER — Encounter: Payer: Self-pay | Admitting: Family Medicine

## 2017-06-16 VITALS — BP 110/76 | HR 65 | Temp 98.0°F | Wt 203.0 lb

## 2017-06-16 DIAGNOSIS — Z23 Encounter for immunization: Secondary | ICD-10-CM

## 2017-06-16 DIAGNOSIS — B029 Zoster without complications: Secondary | ICD-10-CM

## 2017-06-16 NOTE — Patient Instructions (Signed)
Shingles Shingles, which is also known as herpes zoster, is an infection that causes a painful skin rash and fluid-filled blisters. Shingles is not related to genital herpes, which is a sexually transmitted infection. Shingles only develops in people who:  Have had chickenpox.  Have received the chickenpox vaccine. (This is rare.)  What are the causes? Shingles is caused by varicella-zoster virus (VZV). This is the same virus that causes chickenpox. After exposure to VZV, the virus stays in the body in an inactive (dormant) state. Shingles develops if the virus reactivates. This can happen many years after the initial exposure to VZV. It is not known what causes this virus to reactivate. What increases the risk? People who have had chickenpox or received the chickenpox vaccine are at risk for shingles. Infection is more common in people who:  Are older than age 50.  Have a weakened defense (immune) system, such as those with HIV, AIDS, or cancer.  Are taking medicines that weaken the immune system, such as transplant medicines.  Are under great stress.  What are the signs or symptoms? Early symptoms of this condition include itching, tingling, and pain in an area on your skin. Pain may be described as burning, stabbing, or throbbing. A few days or weeks after symptoms start, a painful red rash appears, usually on one side of the body in a bandlike or beltlike pattern. The rash eventually turns into fluid-filled blisters that break open, scab over, and dry up in about 2-3 weeks. At any time during the infection, you may also develop:  A fever.  Chills.  A headache.  An upset stomach.  How is this diagnosed? This condition is diagnosed with a skin exam. Sometimes, skin or fluid samples are taken from the blisters before a diagnosis is made. These samples are examined under a microscope or sent to a lab for testing. How is this treated? There is no specific cure for this condition.  Your health care provider will probably prescribe medicines to help you manage pain, recover more quickly, and avoid long-term problems. Medicines may include:  Antiviral drugs.  Anti-inflammatory drugs.  Pain medicines.  If the area involved is on your face, you may be referred to a specialist, such as an eye doctor (ophthalmologist) or an ear, nose, and throat (ENT) doctor to help you avoid eye problems, chronic pain, or disability. Follow these instructions at home: Medicines  Take medicines only as directed by your health care provider.  Apply an anti-itch or numbing cream to the affected area as directed by your health care provider. Blister and Rash Care  Take a cool bath or apply cool compresses to the area of the rash or blisters as directed by your health care provider. This may help with pain and itching.  Keep your rash covered with a loose bandage (dressing). Wear loose-fitting clothing to help ease the pain of material rubbing against the rash.  Keep your rash and blisters clean with mild soap and cool water or as directed by your health care provider.  Check your rash every day for signs of infection. These include redness, swelling, and pain that lasts or increases.  Do not pick your blisters.  Do not scratch your rash. General instructions  Rest as directed by your health care provider.  Keep all follow-up visits as directed by your health care provider. This is important.  Until your blisters scab over, your infection can cause chickenpox in people who have never had it or been vaccinated   against it. To prevent this from happening, avoid contact with other people, especially: ? Babies. ? Pregnant women. ? Children who have eczema. ? Elderly people who have transplants. ? People who have chronic illnesses, such as leukemia or AIDS. Contact a health care provider if:  Your pain is not relieved with prescribed medicines.  Your pain does not get better after  the rash heals.  Your rash looks infected. Signs of infection include redness, swelling, and pain that lasts or increases. Get help right away if:  The rash is on your face or nose.  You have facial pain, pain around your eye area, or loss of feeling on one side of your face.  You have ear pain or you have ringing in your ear.  You have loss of taste.  Your condition gets worse. This information is not intended to replace advice given to you by your health care provider. Make sure you discuss any questions you have with your health care provider. Document Released: 06/13/2005 Document Revised: 02/07/2016 Document Reviewed: 04/24/2014 Elsevier Interactive Patient Education  2018 Elsevier Inc.  

## 2017-06-16 NOTE — Progress Notes (Signed)
Subjective:    Patient ID: Keith Bishop, male    DOB: 08/03/1970, 46 y.o.   MRN: [id number]  HPI This is a  46 yo male who presents today with back pain x several days around same time, got a rash on his back. 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 Pain on left side low back, no radiation, no numbness/tingling/weakness, no bowel or bladder incontinence. Has taken Alleve occasionally with improvement of pain.   Past Medical History:  Diagnosis Date  . Anemia   . Benign neoplasm of skin of trunk, except scrotum   . Fatty liver disease, nonalcoholic   . Lymphadenopathy    s/p cervical/supraclavicular biopsy 1997 and again 2006 and 2007, necrotizing granulomatous inflammation, possibly thought due to periodontal disease, again 03/2011 - lymphoid tissue, QNS for flow cytometry, eval by surg then onc at [facility] cancer center - no further intervention recommended  . Other malaise and fatigue   . Personal history of tobacco use, presenting hazards to health   . Primary male hypogonadism   . Pure hyperglyceridemia   . Vitiligo    Past Surgical History:  Procedure Laterality Date  . colonoscopy/endoscopy  02/2011   overall normal - focal mild chronic gastritis, rectal serrated adenoma polyp rpt 5 yrs  . CT chest/abd  03/2006   progression of LAD in sup mediastinum, persistent lymphadenopathy in neck/R supraclav, splenomegaly  . CT chest/abd/pelvis  02/2011   moderate thoracic inlet, mild mediastinal LAD, extensive upper abd and retroperitoneal LAD, splenomegaly.  consider lymphoma  . INGUINAL HERNIA REPAIR Left 04/05/07   Dr. Brantley Stage  . Lymphnode biopsy Right 06/12/1996   supraclav area-negative   . Lymphnode biopsy Right 03/04/05, 05/29/06   open cervical-negative (necrotizing granulomatous inflammation)  . lymphnode biopsy Left 03/2011   core needle biopsy - supraclav area- lymphoid tissue, QNS for flow cytometry  . MOUTH SURGERY  9/11   Family History  Problem Relation  Age of Onset  . Heart attack Father 43       deceased  . Hypertension Mother   . Diabetes Mother   . Hypertension Maternal Grandmother   . Irritable bowel syndrome Maternal Grandmother   . Hypertension Sister   . Diabetes Sister   . Obesity Sister   . Alcohol abuse Maternal Uncle   . Gestational diabetes Sister   . Colon cancer Maternal Grandfather 27   Social History   Tobacco Use  . Smoking status: Current Every Day Smoker    Last attempt to quit: 04/28/2009    Years since quitting: 8.1  . Smokeless tobacco: Never Used  Substance Use Topics  . Alcohol use: No    Alcohol/week: 0.0 oz  . Drug use: No      Review of Systems Per HPI    Objective:   Physical Exam  Constitutional: He is oriented to person, place, and time. He appears well-developed and well-nourished. No distress.  HENT:  Head: Normocephalic and atraumatic.  Eyes: Conjunctivae are normal.  Cardiovascular: Normal rate.  Pulmonary/Chest: Effort normal.  Neurological: He is alert and oriented to person, place, and time.  Skin: Skin is warm and dry. Rash (;eft side of lower back with resoving vessicles. SOme scabbing/crusting. No drainage. ) noted. He is not diaphoretic.  Psychiatric: He has a normal mood and affect. His behavior is normal. Judgment and thought content normal.  Vitals reviewed.     BP 110/76 (BP Location: Right Arm, Patient Position: Sitting, Cuff Size: Normal)  Pulse 65   Temp 98 F (36.7 C) (Oral)   Wt 203 lb (92.1 kg)   SpO2 97%   BMI 26.06 kg/m  Wt Readings from Last 3 Encounters:  06/16/17 203 lb (92.1 kg)  08/12/16 200 lb (90.7 kg)  11/05/14 200 lb 1.9 oz (90.8 kg)       Assessment & Plan:  1. Herpes zoster without complication - Provided written and verbal information regarding diagnosis and treatment. - RTC precautions reviewed - encouraged him to schedule CPE with PCP  2. Flu vaccine need - Flu Vaccine QUAD 6+ mos PF IM (Fluarix Quad PF)   Clarene Reamer,  FNP-BC  [facility] [place] [facility]  06/16/2017 9:51 AM

## 2017-08-28 ENCOUNTER — Telehealth: Payer: Self-pay | Admitting: Family Medicine

## 2017-08-28 NOTE — Telephone Encounter (Signed)
Copied from [place]. Topic: Quick Communication - Rx Refill/Question >> Aug 28, 2017  8:23 AM Robina Ade, Helene Kelp D wrote: Medication: Chantix   Has the patient contacted their pharmacy? No its a new request to stop smoking   (Agent: If no, request that the patient contact the pharmacy for the refill.)   Preferred Pharmacy (with phone number or street name): [id number] - [place], [place]   Agent: Please be advised that RX refills may take up to 3 business days. We ask that you follow-up with your pharmacy.

## 2017-08-31 ENCOUNTER — Telehealth: Payer: Self-pay | Admitting: Family Medicine

## 2017-08-31 NOTE — Telephone Encounter (Signed)
Copied from [place] [id number]. Topic: General - Other >> Aug 31, 2017  4:25 PM Darl Householder, RMA wrote: Reason for CRM: Medication refill request for Chantix

## 2017-09-01 MED ORDER — VARENICLINE TARTRATE 1 MG PO TABS: 1.0000 mg | ORAL_TABLET | Freq: Two times a day (BID) | ORAL | 0 refills | Status: DC

## 2017-09-01 MED ORDER — VARENICLINE TARTRATE 0.5 MG X 11 & 1 MG X 42 PO MISC: ORAL | 0 refills | Status: DC

## 2017-09-01 NOTE — Telephone Encounter (Signed)
Pt requesting chantix to stop smoking per note; pt last annual and last time saw Dr Darnell Level was 06/06/14. No future appt scheduled. Please advise.

## 2017-09-01 NOTE — Telephone Encounter (Signed)
plz notify sent in. Recommend CPE as overdue (05/2014).

## 2017-09-01 NOTE — Telephone Encounter (Signed)
Spoke with pt notifying him rx was sent to the pharmacy. Also, informed pt he is overdue for CPE.  Says she will call back to schedule.

## 2017-09-28 ENCOUNTER — Telehealth: Payer: Self-pay | Admitting: Family Medicine

## 2017-09-28 ENCOUNTER — Encounter: Payer: Self-pay | Admitting: Family Medicine

## 2017-09-28 ENCOUNTER — Ambulatory Visit (INDEPENDENT_AMBULATORY_CARE_PROVIDER_SITE_OTHER): Payer: 59 | Admitting: Family Medicine

## 2017-09-28 VITALS — BP 132/86 | HR 65 | Temp 97.6°F | Wt 207.2 lb

## 2017-09-28 DIAGNOSIS — M25562 Pain in left knee: Secondary | ICD-10-CM

## 2017-09-28 MED ORDER — NAPROXEN 500 MG PO TABS: ORAL_TABLET | ORAL | 0 refills | Status: DC

## 2017-09-28 NOTE — Patient Instructions (Addendum)
I think you've injured knee meniscus.  Treat with prescription anti inflammatory, continue knee sleeve brace, and rest, elevate at home, ice to knee 10-14min at a time (covered in towel).  If no better with prescription anti inflammatory, let me know for physical therapy referral.

## 2017-09-28 NOTE — Telephone Encounter (Signed)
Copied from [place]. Topic: General - Other >> Sep 28, 2017 11:29 AM Cecelia Byars, NT wrote: Reason for CRM: Patient called and said his job will not accept the note he was given, today  he needs a more specific , detailed note saying why he is unable to do his job please call him at [phone] he would like to come back by today and pick this up    ,he says he apologizes for this inconvenience.

## 2017-09-28 NOTE — Progress Notes (Signed)
BP 132/86 (BP Location: Left Arm, Patient Position: Sitting, Cuff Size: Normal)   Pulse 65   Temp 97.6 F (36.4 C) (Oral)   Wt 207 lb 4 oz (94 kg)   SpO2 97%   BMI 26.61 kg/m    CC: L knee pain Subjective:    Patient ID: Keith Bishop, male    DOB: 07/11/1970, 47 y.o.   MRN: [id number]  HPI: Keith Bishop is a 47 y.o. male presenting on 09/28/2017 for Knee Pain (Started 2 weeks ago. Not getting better. Been taking Aleve but not much relief.)   I last saw patient 05/2014. He has been seen intermittently in office since then.  2 wk h/o L medial knee pain. Denies inciting trauma/injury - but it did start after getting out of his car.  Using knee sleeve brace. No locking of knee. + instability. 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 Has tried aleve 1 a day for discomfort with minimal improvement. Limping gait, compensating for L knee - now back starting to hurt.   Smoking - 3/4 ppd - did not tolerate chantix. Considering patch.   Relevant past medical, surgical, family and social history reviewed and updated as indicated. Interim medical history since our last visit reviewed. Allergies and medications reviewed and updated. Outpatient Medications Prior to Visit  Medication Sig Dispense Refill  . aspirin EC 81 MG tablet Take 81-162 mg by mouth 2 (two) times daily as needed (for chest pain).     Marland Kitchen omeprazole (PRILOSEC) 10 MG capsule Take by mouth.    . varenicline (CHANTIX CONTINUING MONTH PAK) 1 MG tablet Take 1 tablet (1 mg total) by mouth 2 (two) times daily. (Patient not taking: Reported on 09/28/2017) 60 tablet 0  . varenicline (CHANTIX STARTING MONTH PAK) 0.5 MG X 11 & 1 MG X 42 tablet Take one 0.5 mg tab PO QD for 3 days, then increase to one 0.5 mg tab BID for 4 days, then increase to one 1 mg tab BID (Patient not taking: Reported on 09/28/2017) 53 tablet 0   No facility-administered medications prior to visit.      Per HPI unless specifically indicated in ROS section  below Review of Systems     Objective:    BP 132/86 (BP Location: Left Arm, Patient Position: Sitting, Cuff Size: Normal)   Pulse 65   Temp 97.6 F (36.4 C) (Oral)   Wt 207 lb 4 oz (94 kg)   SpO2 97%   BMI 26.61 kg/m   Wt Readings from Last 3 Encounters:  09/28/17 207 lb 4 oz (94 kg)  06/16/17 203 lb (92.1 kg)  08/12/16 200 lb (90.7 kg)    Physical Exam  Constitutional: He appears well-developed and well-nourished. No distress.  Musculoskeletal: He exhibits no edema.  L knee WNL R knee exam: No deformity on inspection.  Tender to palpation medial knee joint  No significant effusion/swelling noted. FROM in flex/extension without crepitus. No popliteal fullness. Neg drawer test. Tender with mcmurray test. No pain with valgus/varus stress. No PFgrind. No abnormal patellar mobility.   Skin: Skin is warm and dry. No rash noted.  Psychiatric: He has a normal mood and affect.  Nursing note and vitals reviewed.     Assessment & Plan:   Problem List Items Addressed This Visit    Left medial knee pain - Primary    Anticipate meniscal injury after getting out of car - discussed anticipated treatment and course of improvement. Treat with  naprosyn 500mg  bid x 1 wk as well as ice, elevation, rest, continued knee brace. Provided with exercises on meniscal injury from [facility] pt advisor. Update if not improving with treatment. Discussed planned PT course if not improving with above.           Meds ordered this encounter  Medications  . naproxen (NAPROSYN) 500 MG tablet    Sig: Take one po bid x 1 week then prn pain, take with food    Dispense:  40 tablet    Refill:  0   No orders of the defined types were placed in this encounter.   Follow up plan: Return if symptoms worsen or fail to improve.  Ria Bush, MD

## 2017-09-28 NOTE — Assessment & Plan Note (Signed)
Anticipate meniscal injury after getting out of car - discussed anticipated treatment and course of improvement. Treat with naprosyn 500mg  bid x 1 wk as well as ice, elevation, rest, continued knee brace. Provided with exercises on meniscal injury from [facility] pt advisor. Update if not improving with treatment. Discussed planned PT course if not improving with above.

## 2017-09-28 NOTE — Telephone Encounter (Signed)
Spoke with pt about work note and mentioned Dr. Darnell Level suggested adding "due to left knee injury".  Pt says that would be good but to also add the instructions for care by Dr. Darnell Level.  Informed pt the note will be ready to pick up today.  Expresses his thanks.   Printed new work note with Dr. Synthia Innocent info and suggested info from pt. Placed note at front office.

## 2018-08-26 HISTORY — PX: SKIN GRAFT SPLIT THICKNESS LEG / FOOT: SUR1303

## 2018-09-24 ENCOUNTER — Encounter: Payer: Self-pay | Admitting: Family Medicine

## 2018-09-24 DIAGNOSIS — T300 Burn of unspecified body region, unspecified degree: Secondary | ICD-10-CM

## 2018-09-24 HISTORY — DX: Burn of unspecified body region, unspecified degree: T30.0

## 2018-09-26 ENCOUNTER — Encounter: Payer: Self-pay | Admitting: Family Medicine

## 2018-09-27 ENCOUNTER — Telehealth: Payer: Self-pay | Admitting: Family Medicine
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
## 2018-09-29 MED ORDER — ENOXAPARIN SODIUM 30 MG/0.3ML ~~LOC~~ SOLN
30.00 | SUBCUTANEOUS | Status: DC
Start: 2018-09-29 — End: 2018-09-29

## 2018-09-29 MED ORDER — GENERIC EXTERNAL MEDICATION
500.00 | Status: DC
Start: 2018-09-30 — End: 2018-09-29

## 2018-09-29 MED ORDER — GABAPENTIN 300 MG PO CAPS
300.00 | ORAL_CAPSULE | ORAL | Status: DC
Start: 2018-09-29 — End: 2018-09-29

## 2018-09-29 MED ORDER — DICLOXACILLIN SODIUM 62.5 MG/5ML PO SUSR
10.00 | ORAL | Status: DC
Start: ? — End: 2018-09-29

## 2018-09-29 MED ORDER — GENTAK 0.3 % OP SOLN
1.00 | OPHTHALMIC | Status: DC
Start: 2018-09-30 — End: 2018-09-29

## 2018-09-29 MED ORDER — ACETAMINOPHEN 325 MG PO TABS
650.00 | ORAL_TABLET | ORAL | Status: DC
Start: ? — End: 2018-09-29

## 2018-09-29 MED ORDER — OXYCODONE HCL 5 MG PO TABS
15.00 | ORAL_TABLET | ORAL | Status: DC
Start: ? — End: 2018-09-29

## 2018-09-29 MED ORDER — BACITRACIN 500 UNIT/GM EX OINT
TOPICAL_OINTMENT | CUTANEOUS | Status: DC
Start: ? — End: 2018-09-29

## 2018-09-29 MED ORDER — GLUCOSAMINE-CHONDROIT-COLLAGEN PO
100.00 | ORAL | Status: DC
Start: 2018-09-30 — End: 2018-09-29

## 2018-09-29 MED ORDER — ISOSORBIDE DINITRATE 10 MG PO TABS
4.00 | ORAL_TABLET | ORAL | Status: DC
Start: ? — End: 2018-09-29

## 2018-09-29 MED ORDER — ZINC SULFATE 220 (50 ZN) MG PO CAPS
220.00 | ORAL_CAPSULE | ORAL | Status: DC
Start: 2018-09-30 — End: 2018-09-29

## 2018-09-29 MED ORDER — POLYETHYLENE GLYCOL 3350 17 G PO PACK
17.00 | PACK | ORAL | Status: DC
Start: 2018-09-29 — End: 2018-09-29

## 2018-09-29 MED ORDER — MP TRI-FED COLD 2.5-60 MG PO TABS
50.00 | ORAL_TABLET | ORAL | Status: DC
Start: ? — End: 2018-09-29

## 2018-10-02 ENCOUNTER — Encounter: Payer: Self-pay | Admitting: Family Medicine

## 2018-10-12 DIAGNOSIS — T23292D Burn of second degree of multiple sites of left wrist and hand, subsequent encounter: Secondary | ICD-10-CM | POA: Insufficient documentation

## 2018-10-12 DIAGNOSIS — T24331D Burn of third degree of right lower leg, subsequent encounter: Secondary | ICD-10-CM | POA: Insufficient documentation

## 2018-12-10 ENCOUNTER — Ambulatory Visit (INDEPENDENT_AMBULATORY_CARE_PROVIDER_SITE_OTHER): Payer: 59 | Admitting: Family Medicine

## 2018-12-10 ENCOUNTER — Encounter: Payer: Self-pay | Admitting: Family Medicine

## 2018-12-10 ENCOUNTER — Telehealth: Payer: Self-pay | Admitting: General Practice

## 2018-12-10 VITALS — Ht 76.0 in | Wt 223.0 lb

## 2018-12-10 DIAGNOSIS — Z20822 Contact with and (suspected) exposure to covid-19: Secondary | ICD-10-CM

## 2018-12-10 DIAGNOSIS — Z7189 Other specified counseling: Secondary | ICD-10-CM | POA: Diagnosis not present

## 2018-12-10 DIAGNOSIS — Z87891 Personal history of nicotine dependence: Secondary | ICD-10-CM | POA: Diagnosis not present

## 2018-12-10 DIAGNOSIS — R0609 Other forms of dyspnea: Secondary | ICD-10-CM | POA: Diagnosis not present

## 2018-12-10 DIAGNOSIS — T300 Burn of unspecified body region, unspecified degree: Secondary | ICD-10-CM | POA: Diagnosis not present

## 2018-12-10 NOTE — Addendum Note (Signed)
Addended by: Denman George on: 12/10/2018 03:56 PM   Modules accepted: Orders

## 2018-12-10 NOTE — Telephone Encounter (Signed)
Pt has been scheduled for covid testing. Scheduled pt with spouse Anderson Malta, she will also be getting tested.   Pt was referred [id number], Garlon Hatchet, MD
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
## 2018-12-10 NOTE — Assessment & Plan Note (Signed)
Wife recently exposed at work, discussed he is not at high risk of contracting unless wife develops symptoms/tests positive. Reviewed home precautions while awaiting wife's status.  Reasonable to test given my understanding is this week [place] has largely broadened out testing criteria. I have sent note to [facility] for his testing.

## 2018-12-10 NOTE — Telephone Encounter (Signed)
-----   Message from Ria Bush, MD sent at 12/10/2018 12:06 PM EDT ----- Regarding: Covid testing Would like to order testing for patient. Wife Anderson Malta recently closely exposed to positive patient.  Thanks, Garlon Hatchet

## 2018-12-10 NOTE — Assessment & Plan Note (Signed)
Congratulated on smoking cessation.  
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
## 2018-12-10 NOTE — Assessment & Plan Note (Addendum)
Ongoing in long-term smoker - discussed pulmonary function testing at a future date.

## 2018-12-11 ENCOUNTER — Other Ambulatory Visit: Payer: 59

## 2018-12-11 DIAGNOSIS — Z20822 Contact with and (suspected) exposure to covid-19: Secondary | ICD-10-CM

## 2018-12-12 LAB — NOVEL CORONAVIRUS, NAA: SARS-CoV-2, NAA: NOT DETECTED

## 2018-12-13 ENCOUNTER — Telehealth: Payer: Self-pay

## 2018-12-13 NOTE — Telephone Encounter (Signed)
Spoke with pt and wife, Anderson Malta, asking how he is doing.  Pt is doing fine.

## 2019-03-27 ENCOUNTER — Encounter: Payer: Self-pay | Admitting: Family Medicine

## 2019-03-27 ENCOUNTER — Ambulatory Visit (INDEPENDENT_AMBULATORY_CARE_PROVIDER_SITE_OTHER)
Admission: RE | Admit: 2019-03-27 | Discharge: 2019-03-27 | Disposition: A | Discharge status: Home / Self Care | Payer: 59 | Source: Ambulatory Visit | Attending: Family Medicine | Admitting: Family Medicine

## 2019-03-27 ENCOUNTER — Other Ambulatory Visit: Payer: Self-pay

## 2019-03-27 ENCOUNTER — Ambulatory Visit (INDEPENDENT_AMBULATORY_CARE_PROVIDER_SITE_OTHER): Payer: 59 | Admitting: Family Medicine

## 2019-03-27 VITALS — BP 128/86 | HR 96 | Temp 98.5°F | Ht 76.0 in | Wt 203.3 lb

## 2019-03-27 DIAGNOSIS — M545 Low back pain, unspecified: Secondary | ICD-10-CM

## 2019-03-27 DIAGNOSIS — R591 Generalized enlarged lymph nodes: Secondary | ICD-10-CM

## 2019-03-27 DIAGNOSIS — G8929 Other chronic pain: Secondary | ICD-10-CM | POA: Diagnosis not present

## 2019-03-27 DIAGNOSIS — R1030 Lower abdominal pain, unspecified: Secondary | ICD-10-CM | POA: Diagnosis not present

## 2019-03-27 DIAGNOSIS — Z23 Encounter for immunization: Secondary | ICD-10-CM

## 2019-03-27 DIAGNOSIS — R61 Generalized hyperhidrosis: Secondary | ICD-10-CM | POA: Diagnosis not present

## 2019-03-27 DIAGNOSIS — R198 Other specified symptoms and signs involving the digestive system and abdomen: Secondary | ICD-10-CM | POA: Diagnosis not present

## 2019-03-27 DIAGNOSIS — Z87891 Personal history of nicotine dependence: Secondary | ICD-10-CM

## 2019-03-27 DIAGNOSIS — R7982 Elevated C-reactive protein (CRP): Secondary | ICD-10-CM

## 2019-03-27 DIAGNOSIS — T300 Burn of unspecified body region, unspecified degree: Secondary | ICD-10-CM

## 2019-03-27 DIAGNOSIS — S6990XA Unspecified injury of unspecified wrist, hand and finger(s), initial encounter: Secondary | ICD-10-CM

## 2019-03-27 LAB — CBC WITH DIFFERENTIAL/PLATELET
Absolute Monocytes: 496 cells/uL (ref 200–950)
Basophils Absolute: 32 cells/uL (ref 0–200)
Basophils Relative: 0.4 %
Eosinophils Absolute: 240 cells/uL (ref 15–500)
Eosinophils Relative: 3 %
HCT: 35.7 % — ABNORMAL LOW (ref 38.5–50.0)
Hemoglobin: 11.6 g/dL — ABNORMAL LOW (ref 13.2–17.1)
Lymphs Abs: 1200 cells/uL (ref 850–3900)
MCH: 24.9 pg — ABNORMAL LOW (ref 27.0–33.0)
MCHC: 32.5 g/dL (ref 32.0–36.0)
MCV: 76.6 fL — ABNORMAL LOW (ref 80.0–100.0)
MPV: 9.5 fL (ref 7.5–12.5)
Monocytes Relative: 6.2 %
Neutro Abs: 6032 cells/uL (ref 1500–7800)
Neutrophils Relative %: 75.4 %
Platelets: 399 10*3/uL (ref 140–400)
RBC: 4.66 10*6/uL (ref 4.20–5.80)
RDW: 13.4 % (ref 11.0–15.0)
Total Lymphocyte: 15 %
WBC: 8 10*3/uL (ref 3.8–10.8)

## 2019-03-27 LAB — POC URINALSYSI DIPSTICK (AUTOMATED)
Blood, UA: NEGATIVE
Glucose, UA: NEGATIVE
Ketones, UA: NEGATIVE
Leukocytes, UA: NEGATIVE
Nitrite, UA: NEGATIVE
Protein, UA: POSITIVE — AB
Spec Grav, UA: >=1.03 — AB (ref 1.010–1.025)
Urobilinogen, UA: 1 E.U./dL
pH, UA: 5 (ref 5.0–8.0)

## 2019-03-27 NOTE — Patient Instructions (Addendum)
Flu shot today Lower back xray today.  Urinalysis today  Labs today.  We will be in touch with results. Continue to push water.
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
## 2019-03-28 DIAGNOSIS — U071 COVID-19: Secondary | ICD-10-CM

## 2019-03-28 DIAGNOSIS — R198 Other specified symptoms and signs involving the digestive system and abdomen: Secondary | ICD-10-CM | POA: Insufficient documentation

## 2019-03-28 DIAGNOSIS — S6990XA Unspecified injury of unspecified wrist, hand and finger(s), initial encounter: Secondary | ICD-10-CM | POA: Insufficient documentation

## 2019-03-28 DIAGNOSIS — R1030 Lower abdominal pain, unspecified: Secondary | ICD-10-CM | POA: Insufficient documentation

## 2019-03-28 HISTORY — DX: COVID-19: U07.1

## 2019-03-28 LAB — TSH: TSH: 0.49 u[IU]/mL (ref 0.35–4.50)

## 2019-03-28 LAB — COMPREHENSIVE METABOLIC PANEL
ALT: 29 U/L (ref 0–53)
AST: 12 U/L (ref 0–37)
Albumin: 3.8 g/dL (ref 3.5–5.2)
Alkaline Phosphatase: 80 U/L (ref 39–117)
BUN: 16 mg/dL (ref 6–23)
CO2: 29 mEq/L (ref 19–32)
Calcium: 8.8 mg/dL (ref 8.4–10.5)
Chloride: 103 mEq/L (ref 96–112)
Creatinine, Ser: 1.06 mg/dL (ref 0.40–1.50)
GFR: 74.13 mL/min (ref >60.00)
Glucose, Bld: 86 mg/dL (ref 70–99)
Potassium: 4.3 mEq/L (ref 3.5–5.1)
Sodium: 142 mEq/L (ref 135–145)
Total Bilirubin: 0.4 mg/dL (ref 0.2–1.2)
Total Protein: 6.3 g/dL (ref 6.0–8.3)

## 2019-03-28 LAB — PSA: PSA: 0.54 ng/mL (ref 0.10–4.00)

## 2019-03-28 NOTE — Assessment & Plan Note (Signed)
Wound care performed (debridement of skin flap), dressed with abx ointment and bandaid. Home care reviewed.

## 2019-03-28 NOTE — Assessment & Plan Note (Signed)
States he quit smoking 08/2018

## 2019-03-28 NOTE — Assessment & Plan Note (Signed)
Of almost 1 month duration, failed UCC treatment with flexeril/meloxicam, now with associated constipation (?med related). Check lumbar films today - without obvious mass or fracture on my read. Will await radiology eval. Continue current meds. Check other labs as per below.
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
## 2019-03-28 NOTE — Assessment & Plan Note (Signed)
Check UA/UCx

## 2019-03-28 NOTE — Assessment & Plan Note (Addendum)
H/o generalized lymphadenopathy with unrevealing evaluation including prior biopsies. Now endorsing night sweats, malaise, back and left sided abd pain. Check labwork including peripheral smear, consider updated abdominal imaging.

## 2019-03-28 NOTE — Assessment & Plan Note (Signed)
New, with lower back pain and rectal pressure with voiding ?prostatitis however DRE without painful boggy prostate. UA without signs of infection - UCx sent. Consider empiric abx to cover prostatitis.

## 2019-03-28 NOTE — Assessment & Plan Note (Signed)
None appreciated today.

## 2019-03-29 ENCOUNTER — Other Ambulatory Visit: Payer: Self-pay | Admitting: Family Medicine

## 2019-03-29 DIAGNOSIS — Z1211 Encounter for screening for malignant neoplasm of colon: Secondary | ICD-10-CM

## 2019-03-29 LAB — URINE CULTURE
MICRO NUMBER:: 939365
Result:: NO GROWTH
SPECIMEN QUALITY:: ADEQUATE

## 2019-03-29 LAB — PATHOLOGIST SMEAR REVIEW

## 2019-04-24 ENCOUNTER — Encounter: Payer: Self-pay | Admitting: Family Medicine

## 2019-04-25 ENCOUNTER — Telehealth: Payer: Self-pay | Admitting: Family Medicine

## 2019-04-25 DIAGNOSIS — U071 COVID-19: Secondary | ICD-10-CM

## 2019-04-25 NOTE — Telephone Encounter (Signed)
Noted. Thanks. Agreed.  

## 2019-04-25 NOTE — Telephone Encounter (Signed)
Returned pt's call.  States he tested positive for COVID 04/24/19.  Had test done 04/21/19 at [facility].   Sxs started 10/21 or 10/22.  Consisted of loss of sense of test/smell, runny nose and cough.  Pt is in quarantine. States cough seems to have improved but still has loss of taste/smell and runny nose.   I will inform [place] [facility] and add pt to call list. Fyi to Dr. Darnell Level and Dr. Damita Dunnings.

## 2019-04-25 NOTE — Telephone Encounter (Signed)
Pt left a message on the voicemail requesting a call back from [place].  Pt did not state what he needed - just requesting a call back

## 2019-04-29 ENCOUNTER — Encounter: Payer: Self-pay | Admitting: Family Medicine

## 2019-04-29 NOTE — Telephone Encounter (Addendum)
Noted. Thank you.  Let's call later this week for an update.

## 2019-05-01 NOTE — Telephone Encounter (Addendum)
Tomorrow marks 2 wks from symptom onset. According to CDC criteria should be able to return to work once fever free for 24 hours and symptoms improving. However if work requires negative test, recommend he get tested tomorrow. May go to [facility] site for testing. Test ordered.

## 2019-05-01 NOTE — Telephone Encounter (Signed)
Pt notified as instructed by phone.  Provided th following [facility] testing site [employer] info:  [place], [place], M-F 8:00-3:30, wear mask and stay in vehicle.  Pt verbalizes understanding.

## 2019-05-01 NOTE — Telephone Encounter (Signed)
Patient left a voicemail stating that he needs to be retested and receive a negative covid result before going back to work. Called patient back and was advised that he has been fever free for two days. Patient stated that he feels fine and just has a runny nose. Patient stated that he was tested at [facility] 04/21/19 and got the results back 04/24/19. Patient wants to know if he should go back to [facility] or go to a facility that Dr. Danise Mina recommends? Patient wants to know also when he should be retested?

## 2019-05-01 NOTE — Addendum Note (Signed)
Addended by: Ria Bush on: 05/01/2019 02:50 PM   Modules accepted: Orders

## 2019-05-02 ENCOUNTER — Other Ambulatory Visit: Payer: Self-pay

## 2019-05-02 DIAGNOSIS — Z20822 Contact with and (suspected) exposure to covid-19: Secondary | ICD-10-CM

## 2019-05-03 ENCOUNTER — Encounter: Payer: Self-pay | Admitting: Family Medicine

## 2019-05-03 NOTE — Telephone Encounter (Signed)
Dr. Darnell Level., received report and faxed it to Tammy at [facility]. This is taking care of but wanted to route to you as [facility]

## 2019-05-03 NOTE — Telephone Encounter (Signed)
Noted. Thanks.

## 2019-05-04 LAB — NOVEL CORONAVIRUS, NAA

## 2019-05-05 ENCOUNTER — Encounter: Payer: Self-pay | Admitting: Family Medicine

## 2019-06-01 ENCOUNTER — Encounter: Payer: Self-pay | Admitting: Family Medicine

## 2019-06-01 DIAGNOSIS — M5442 Lumbago with sciatica, left side: Secondary | ICD-10-CM

## 2019-06-01 DIAGNOSIS — G8929 Other chronic pain: Secondary | ICD-10-CM

## 2019-06-04 MED ORDER — MELOXICAM 15 MG PO TABS: 15.0000 mg | ORAL_TABLET | Freq: Every day | ORAL | 0 refills | Status: DC

## 2019-06-04 NOTE — Telephone Encounter (Signed)
Spoke with patient.  Ongoing lower back pain worse when inactive (last few days off at home), better when active at work.  Ongoing x 3 months without significant improvement.  Flexeril caused constipation.  Will order MRI for further evaluation.  Asked to return stool kit.  H/o covid, now back at work.

## 2019-06-14 ENCOUNTER — Telehealth: Payer: Self-pay | Admitting: Family Medicine

## 2019-06-14 NOTE — Telephone Encounter (Signed)
Spoke with patient regarding normal MRI.  Patient states improving back pain since started meloxicam 15mg  daily.

## 2019-08-27 ENCOUNTER — Other Ambulatory Visit: Payer: Self-pay | Admitting: Family Medicine

## 2019-08-27 NOTE — Telephone Encounter (Signed)
Meloxicam Last filled:  06/04/19, #90 Last OV:  03/27/19, acute back pain Next OV:  none

## 2019-08-30 ENCOUNTER — Telehealth: Payer: Self-pay

## 2019-08-30 NOTE — Telephone Encounter (Signed)
[place] Night - Client Nonclinical Telephone Record [facility]urse Client [place] Night - Client Client Site [place] Physician Ria Bush - MD Contact Type Call Who Is Calling Patient / Member / Family / Caregiver Caller Name [place] Walk Phone Number [phone] Patient Name Keith Bishop Patient DOB 1970-10-25 Call Type Message Only Information Provided Reason for Call Request to Schedule Office Appointment Initial Comment Caller states he was sent for an MRI in October. He wants to make an appointment to bring in the disk and talk to Dr Danise Mina about the MRI. Additional Comment Please call. Disp. Time Disposition Final User 08/30/2019 7:42:54 AM General Information Provided Yes Clydene Laming, Amy Call Closed By: [place] Transaction Date/Time: 08/30/2019 7:39:59 AM (ET)

## 2019-09-02 ENCOUNTER — Encounter: Payer: Self-pay | Admitting: Family Medicine

## 2019-09-02 ENCOUNTER — Ambulatory Visit (INDEPENDENT_AMBULATORY_CARE_PROVIDER_SITE_OTHER): Payer: 59 | Admitting: Family Medicine

## 2019-09-02 ENCOUNTER — Other Ambulatory Visit: Payer: Self-pay

## 2019-09-02 VITALS — BP 134/90 | HR 88 | Temp 98.4°F | Ht 76.0 in | Wt 199.2 lb

## 2019-09-02 DIAGNOSIS — M5442 Lumbago with sciatica, left side: Secondary | ICD-10-CM

## 2019-09-02 DIAGNOSIS — Z87891 Personal history of nicotine dependence: Secondary | ICD-10-CM

## 2019-09-02 DIAGNOSIS — E291 Testicular hypofunction: Secondary | ICD-10-CM | POA: Diagnosis not present

## 2019-09-02 DIAGNOSIS — R5382 Chronic fatigue, unspecified: Secondary | ICD-10-CM | POA: Diagnosis not present

## 2019-09-02 DIAGNOSIS — R7982 Elevated C-reactive protein (CRP): Secondary | ICD-10-CM

## 2019-09-02 DIAGNOSIS — R232 Flushing: Secondary | ICD-10-CM | POA: Diagnosis not present

## 2019-09-02 DIAGNOSIS — R000 Tachycardia, unspecified: Secondary | ICD-10-CM

## 2019-09-02 DIAGNOSIS — G8929 Other chronic pain: Secondary | ICD-10-CM

## 2019-09-02 DIAGNOSIS — R591 Generalized enlarged lymph nodes: Secondary | ICD-10-CM

## 2019-09-02 DIAGNOSIS — T300 Burn of unspecified body region, unspecified degree: Secondary | ICD-10-CM

## 2019-09-02 DIAGNOSIS — D128 Benign neoplasm of rectum: Secondary | ICD-10-CM

## 2019-09-02 DIAGNOSIS — K056 Periodontal disease, unspecified: Secondary | ICD-10-CM

## 2019-09-02 DIAGNOSIS — R002 Palpitations: Secondary | ICD-10-CM

## 2019-09-02 NOTE — Patient Instructions (Addendum)
Pass by lab to pick up urine testing looking for pheochromocytoma.  EKG today. Labs today.  Schedule physical at your convenience We will refer you back to GI for colonoscopy.
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
## 2019-09-03 ENCOUNTER — Ambulatory Visit: Payer: 59 | Admitting: Family Medicine

## 2019-09-03 LAB — CBC WITH DIFFERENTIAL/PLATELET
Absolute Monocytes: 461 cells/uL (ref 200–950)
Basophils Absolute: 32 cells/uL (ref 0–200)
Basophils Relative: 0.5 %
Eosinophils Absolute: 230 cells/uL (ref 15–500)
Eosinophils Relative: 3.6 %
HCT: 38.3 % — ABNORMAL LOW (ref 38.5–50.0)
Hemoglobin: 12.5 g/dL — ABNORMAL LOW (ref 13.2–17.1)
Lymphs Abs: 1043 cells/uL (ref 850–3900)
MCH: 24.4 pg — ABNORMAL LOW (ref 27.0–33.0)
MCHC: 32.6 g/dL (ref 32.0–36.0)
MCV: 74.7 fL — ABNORMAL LOW (ref 80.0–100.0)
MPV: 9.6 fL (ref 7.5–12.5)
Monocytes Relative: 7.2 %
Neutro Abs: 4634 cells/uL (ref 1500–7800)
Neutrophils Relative %: 72.4 %
Platelets: 261 10*3/uL (ref 140–400)
RBC: 5.13 10*6/uL (ref 4.20–5.80)
RDW: 14.4 % (ref 11.0–15.0)
Total Lymphocyte: 16.3 %
WBC: 6.4 10*3/uL (ref 3.8–10.8)

## 2019-09-03 LAB — VITAMIN D 25 HYDROXY (VIT D DEFICIENCY, FRACTURES): VITD: 24.21 ng/mL — ABNORMAL LOW (ref 30.00–100.00)

## 2019-09-03 LAB — FOLLICLE STIMULATING HORMONE: FSH: 11.7 m[IU]/mL (ref 1.4–18.1)

## 2019-09-03 LAB — FOLATE: Folate: 9.1 ng/mL (ref >5.9)

## 2019-09-03 LAB — COMPREHENSIVE METABOLIC PANEL
ALT: 15 U/L (ref 0–53)
AST: 8 U/L (ref 0–37)
Albumin: 4.1 g/dL (ref 3.5–5.2)
Alkaline Phosphatase: 103 U/L (ref 39–117)
BUN: 15 mg/dL (ref 6–23)
CO2: 30 mEq/L (ref 19–32)
Calcium: 9.2 mg/dL (ref 8.4–10.5)
Chloride: 107 mEq/L (ref 96–112)
Creatinine, Ser: 1.17 mg/dL (ref 0.40–1.50)
GFR: 66.03 mL/min (ref >60.00)
Glucose, Bld: 85 mg/dL (ref 70–99)
Potassium: 4.2 mEq/L (ref 3.5–5.1)
Sodium: 144 mEq/L (ref 135–145)
Total Bilirubin: 0.4 mg/dL (ref 0.2–1.2)
Total Protein: 6.8 g/dL (ref 6.0–8.3)

## 2019-09-03 LAB — IBC PANEL
Iron: 42 ug/dL (ref 42–165)
Saturation Ratios: 17.6 % — ABNORMAL LOW (ref 20.0–50.0)
Transferrin: 170 mg/dL — ABNORMAL LOW (ref 212.0–360.0)

## 2019-09-03 LAB — FERRITIN: Ferritin: 158.4 ng/mL (ref 22.0–322.0)

## 2019-09-03 LAB — VITAMIN B12: Vitamin B-12: 214 pg/mL (ref 211–911)

## 2019-09-03 LAB — LUTEINIZING HORMONE: LH: 8.27 m[IU]/mL (ref 1.50–9.30)

## 2019-09-03 LAB — PATHOLOGIST SMEAR REVIEW

## 2019-09-03 LAB — TESTOSTERONE: Testosterone: 305.07 ng/dL (ref 300.00–890.00)

## 2019-09-03 LAB — PROLACTIN: Prolactin: 12.4 ng/mL (ref 2.0–18.0)

## 2019-09-08 ENCOUNTER — Encounter: Payer: Self-pay | Admitting: Family Medicine

## 2019-09-08 DIAGNOSIS — R5382 Chronic fatigue, unspecified: Secondary | ICD-10-CM | POA: Insufficient documentation

## 2019-09-08 DIAGNOSIS — E559 Vitamin D deficiency, unspecified: Secondary | ICD-10-CM | POA: Insufficient documentation

## 2019-09-08 DIAGNOSIS — K056 Periodontal disease, unspecified: Secondary | ICD-10-CM | POA: Insufficient documentation

## 2019-09-08 DIAGNOSIS — D509 Iron deficiency anemia, unspecified: Secondary | ICD-10-CM | POA: Insufficient documentation

## 2019-09-08 DIAGNOSIS — E538 Deficiency of other specified B group vitamins: Secondary | ICD-10-CM | POA: Insufficient documentation

## 2019-09-08 DIAGNOSIS — D128 Benign neoplasm of rectum: Secondary | ICD-10-CM | POA: Insufficient documentation

## 2019-09-08 DIAGNOSIS — R000 Tachycardia, unspecified: Secondary | ICD-10-CM | POA: Insufficient documentation

## 2019-09-08 MED ORDER — B-12 1000 MCG SL SUBL: 1.0000 | SUBLINGUAL_TABLET | Freq: Every day | SUBLINGUAL | Status: DC

## 2019-09-08 MED ORDER — VITAMIN D3 25 MCG (1000 UT) PO CAPS: 1.0000 | ORAL_CAPSULE | Freq: Every day | ORAL | Status: DC

## 2019-09-08 NOTE — Assessment & Plan Note (Signed)
Serrated adenoma on colonoscopy 2012 - overdue for f/u - will refer

## 2019-09-08 NOTE — Assessment & Plan Note (Signed)
Congratulated on smoking cessation 08/2018.

## 2019-09-08 NOTE — Assessment & Plan Note (Addendum)
EKG reassuring today.  Does describe episodes of tachypalpitations, sweats, hot flashes. In mildly elevated BP prudent to evaluate for pheochromocytoma. Sent home with 24 hr urine test.  Does not endorse significant stress/anxiety - this does not sound consistent with panic attack - although he has positive anxiety > depression screens today. R/o other cause first.

## 2019-09-08 NOTE — Assessment & Plan Note (Signed)
Continues using protective arm sleeve. Discussed sun exposure with upcoming weather changes

## 2019-09-08 NOTE — Assessment & Plan Note (Signed)
Update testosterone levels - checked at almost 5pm however.

## 2019-09-08 NOTE — Assessment & Plan Note (Addendum)
Ongoing s/p reassuring MRI "normal"  He manages pain with meloxicam and tylenol.

## 2019-09-08 NOTE — Assessment & Plan Note (Addendum)
H/o abnormal iron studies (low transferrin), I don't see where he has seen heme in the past. Consider referral. Check iron studies. Consider ESR and LDH next labwork.

## 2019-09-08 NOTE — Assessment & Plan Note (Signed)
Some concerning symptoms of weight loss over the last few months as well as endorsed night sweats.  Check for reversible causes.

## 2019-09-08 NOTE — Assessment & Plan Note (Signed)
Has established with periodontist, planned scaling and root planing treatment.

## 2019-09-08 NOTE — Assessment & Plan Note (Signed)
Longstanding. Update iron panel with new symptoms.

## 2019-09-08 NOTE — Assessment & Plan Note (Addendum)
H/o abdominal and retroperitoneal LAD previously by imaging.  S/p at least 3 separate inconclusive cervical LN biopsies.  No significant superficial LAD appreciated today. Known gum disease - seeing periodontist

## 2019-09-09 ENCOUNTER — Encounter: Payer: Self-pay | Admitting: Internal Medicine

## 2019-10-24 ENCOUNTER — Ambulatory Visit (AMBULATORY_SURGERY_CENTER): Payer: Self-pay | Admitting: *Deleted

## 2019-10-24 ENCOUNTER — Other Ambulatory Visit: Payer: Self-pay

## 2019-10-24 VITALS — Temp 97.3°F | Ht 76.0 in | Wt 206.0 lb

## 2019-10-24 DIAGNOSIS — Z86010 Personal history of colonic polyps: Secondary | ICD-10-CM

## 2019-10-24 MED ORDER — SUTAB 1479-225-188 MG PO TABS: 1.0000 | ORAL_TABLET | ORAL | 0 refills | Status: DC

## 2019-10-24 NOTE — Progress Notes (Signed)
Patient is here in-person for PV. Patient denies any allergies to eggs or soy. Patient denies any problems with anesthesia/sedation. Patient denies any oxygen use at home. Patient denies taking any diet/weight loss medications or blood thinners. Patient is not being treated for MRSA or C-diff. Patient is aware of our care-partner policy and 0000000 safety protocol. Completed covid vaccines on 10/06/19. Prep Prescription coupon given to the patient.

## 2019-10-26 HISTORY — PX: COLONOSCOPY: SHX174

## 2019-11-08 ENCOUNTER — Encounter: Payer: Self-pay | Admitting: Internal Medicine

## 2019-11-13 ENCOUNTER — Ambulatory Visit (AMBULATORY_SURGERY_CENTER): Payer: 59 | Admitting: Internal Medicine

## 2019-11-13 ENCOUNTER — Other Ambulatory Visit: Payer: Self-pay

## 2019-11-13 ENCOUNTER — Encounter: Payer: Self-pay | Admitting: Internal Medicine

## 2019-11-13 VITALS — BP 116/72 | HR 69 | Temp 97.1°F | Resp 13 | Ht 76.0 in | Wt 206.0 lb

## 2019-11-13 DIAGNOSIS — D120 Benign neoplasm of cecum: Secondary | ICD-10-CM | POA: Diagnosis not present

## 2019-11-13 DIAGNOSIS — K621 Rectal polyp: Secondary | ICD-10-CM

## 2019-11-13 DIAGNOSIS — Z86010 Personal history of colonic polyps: Secondary | ICD-10-CM | POA: Diagnosis present

## 2019-11-13 DIAGNOSIS — D128 Benign neoplasm of rectum: Secondary | ICD-10-CM

## 2019-11-13 MED ORDER — SODIUM CHLORIDE 0.9 % IV SOLN: 500.0000 mL | Freq: Once | INTRAVENOUS | Status: DC

## 2019-11-13 NOTE — Progress Notes (Signed)
To PACU, VSS. Report to [email or web address] 

## 2019-11-13 NOTE — Op Note (Addendum)
[facility] Patient Name: Keith Bishop Procedure Date: 11/13/2019 9:18 AM MRN: [id number] Endoscopist: Jerene Bears , MD Age: 49 Referring MD:  Date of Birth: [birth year] Gender: Male Account #: [id number] Procedure:                Colonoscopy Indications:              High risk colon cancer surveillance: Personal                            history of traditional serrated adenoma of the                            rectum, Last colonoscopy: September 2012 Medicines:                Monitored Anesthesia Care Procedure:                Pre-Anesthesia Assessment:                           - Prior to the procedure, a History and Physical                            was performed, and patient medications and                            allergies were reviewed. The patient's tolerance of                            previous anesthesia was also reviewed. The risks                            and benefits of the procedure and the sedation                            options and risks were discussed with the patient.                            All questions were answered, and informed consent                            was obtained. Prior Anticoagulants: The patient has                            taken no previous anticoagulant or antiplatelet                            agents. ASA Grade Assessment: II - A patient with                            mild systemic disease. After reviewing the risks                            and benefits, the patient was deemed in  satisfactory condition to undergo the procedure.                           After obtaining informed consent, the colonoscope                            was passed under direct vision. Throughout the                            procedure, the patient's blood pressure, pulse, and                            oxygen saturations were monitored continuously. The                            Colonoscope was introduced  through the anus and                            advanced to the cecum, identified by appendiceal                            orifice and ileocecal valve. The colonoscopy was                            performed without difficulty. The patient tolerated                            the procedure well. The quality of the bowel                            preparation was good. The ileocecal valve,                            appendiceal orifice, and rectum were photographed. Scope In: 9:28:04 AM Scope Out: 9:45:43 AM Scope Withdrawal Time: 0 hours 14 minutes 46 seconds  Total Procedure Duration: 0 hours 17 minutes 39 seconds  Findings:                 The digital rectal exam was normal.                           A 2 mm polyp was found in the cecum. The polyp was                            sessile. The polyp was removed with a cold snare.                            Resection and retrieval were complete.                           Two sessile polyps were found in the rectum. The                            polyps were 4 to 5  mm in size. These polyps were                            removed with a cold snare. Resection and retrieval                            were complete.                           Scattered small-mouthed diverticula were found in                            the sigmoid colon, descending colon and transverse                            colon.                           Internal hemorrhoids were found during                            retroflexion. The hemorrhoids were small. Complications:            No immediate complications. Estimated Blood Loss:     Estimated blood loss was minimal. Impression:               - One 2 mm polyp in the cecum, removed with a cold                            snare. Resected and retrieved.                           - Two 4 to 5 mm polyps in the rectum, removed with                            a cold snare. Resected and retrieved.                           -  Diverticulosis in the sigmoid colon, in the                            descending colon and in the transverse colon.                           - Small internal hemorrhoids. Recommendation:           - Patient has a contact number available for                            emergencies. The signs and symptoms of potential                            delayed complications were discussed with the                            patient. Return to normal activities  tomorrow.                            Written discharge instructions were provided to the                            patient.                           - Resume previous diet.                           - Continue present medications.                           - Await pathology results.                           - Repeat colonoscopy is recommended for                            surveillance. The colonoscopy date will be                            determined after pathology results from today's                            exam become available for review. Jerene Bears, MD 11/13/2019 9:49:34 AM This report has been signed electronically.

## 2019-11-13 NOTE — Patient Instructions (Signed)
YOU HAD AN ENDOSCOPIC PROCEDURE TODAY AT THE [facility]:   Refer to the procedure report that was given to you for any specific questions about what was found during the examination.  If the procedure report does not answer your questions, please call your gastroenterologist to clarify.  If you requested that your care partner not be given the details of your procedure findings, then the procedure report has been included in a sealed envelope for you to review at your convenience later.  **Handouts given on Polyps, Diverticulosis and Hemorrhoids**  YOU SHOULD EXPECT: Some feelings of bloating in the abdomen. Passage of more gas than usual.  Walking can help get rid of the air that was put into your GI tract during the procedure and reduce the bloating. If you had a lower endoscopy (such as a colonoscopy or flexible sigmoidoscopy) you may notice spotting of blood in your stool or on the toilet paper. If you underwent a bowel prep for your procedure, you may not have a normal bowel movement for a few days.  Please Note:  You might notice some irritation and congestion in your nose or some drainage.  This is from the oxygen used during your procedure.  There is no need for concern and it should clear up in a day or so.  SYMPTOMS TO REPORT IMMEDIATELY:  Following lower endoscopy (colonoscopy or flexible sigmoidoscopy):  Excessive amounts of blood in the stool  Significant tenderness or worsening of abdominal pains  Swelling of the abdomen that is new, acute  Fever of 100F or higher   For urgent or emergent issues, a gastroenterologist can be reached at any hour by calling [phone]. Do not use MyChart messaging for urgent concerns.    DIET:  We do recommend a small meal at first, but then you may proceed to your regular diet.  Drink plenty of fluids but you should avoid alcoholic beverages for 24 hours.  ACTIVITY:  You should plan to take it easy for the rest of today and you  should NOT DRIVE or use heavy machinery until tomorrow (because of the sedation medicines used during the test).    FOLLOW UP: Our staff will call the number listed on your records 48-72 hours following your procedure to check on you and address any questions or concerns that you may have regarding the information given to you following your procedure. If we do not reach you, we will leave a message.  We will attempt to reach you two times.  During this call, we will ask if you have developed any symptoms of COVID 19. If you develop any symptoms (ie: fever, flu-like symptoms, shortness of breath, cough etc.) before then, please call [phone].  If you test positive for Covid 19 in the 2 weeks post procedure, please call and report this information to us.    If any biopsies were taken you will be contacted by phone or by letter within the next 1-3 weeks.  Please call us at [phone] if you have not heard about the biopsies in 3 weeks.    SIGNATURES/CONFIDENTIALITY: You and/or your care partner have signed paperwork which will be entered into your electronic medical record.  These signatures attest to the fact that that the information above on your After Visit Summary has been reviewed and is understood.  Full responsibility of the confidentiality of this discharge information lies with you and/or your care-partner.  

## 2019-11-13 NOTE — Progress Notes (Signed)
Temp-JB VS-CW  Pt's states no medical or surgical changes since previsit or office visit.  

## 2019-11-15 ENCOUNTER — Telehealth: Payer: Self-pay | Admitting: *Deleted

## 2019-11-15 ENCOUNTER — Telehealth: Payer: Self-pay

## 2019-11-15 ENCOUNTER — Encounter: Payer: Self-pay | Admitting: Internal Medicine

## 2019-11-15 NOTE — Telephone Encounter (Signed)
Could not leave message.

## 2019-11-15 NOTE — Telephone Encounter (Signed)
Mailbox full unable to leave message.

## 2020-01-28 ENCOUNTER — Other Ambulatory Visit: Payer: Self-pay

## 2020-01-28 ENCOUNTER — Telehealth: Payer: Self-pay

## 2020-01-28 ENCOUNTER — Emergency Department (HOSPITAL_COMMUNITY)
Admission: EM | Admit: 2020-01-28 | Discharge: 2020-01-28 | Disposition: A | Discharge status: Home / Self Care | Payer: 59 | Attending: Emergency Medicine | Admitting: Emergency Medicine

## 2020-01-28 ENCOUNTER — Encounter (HOSPITAL_COMMUNITY): Payer: Self-pay

## 2020-01-28 ENCOUNTER — Emergency Department (HOSPITAL_COMMUNITY): Payer: 59

## 2020-01-28 DIAGNOSIS — Z87891 Personal history of nicotine dependence: Secondary | ICD-10-CM | POA: Insufficient documentation

## 2020-01-28 DIAGNOSIS — R110 Nausea: Secondary | ICD-10-CM | POA: Diagnosis not present

## 2020-01-28 DIAGNOSIS — R079 Chest pain, unspecified: Secondary | ICD-10-CM | POA: Diagnosis not present

## 2020-01-28 DIAGNOSIS — R0602 Shortness of breath: Secondary | ICD-10-CM | POA: Diagnosis not present

## 2020-01-28 DIAGNOSIS — F419 Anxiety disorder, unspecified: Secondary | ICD-10-CM | POA: Insufficient documentation

## 2020-01-28 DIAGNOSIS — R1084 Generalized abdominal pain: Secondary | ICD-10-CM | POA: Diagnosis not present

## 2020-01-28 DIAGNOSIS — M546 Pain in thoracic spine: Secondary | ICD-10-CM

## 2020-01-28 LAB — BASIC METABOLIC PANEL
Anion gap: 8 (ref 5–15)
BUN: 15 mg/dL (ref 6–20)
CO2: 25 mmol/L (ref 22–32)
Calcium: 9.1 mg/dL (ref 8.9–10.3)
Chloride: 106 mmol/L (ref 98–111)
Creatinine, Ser: 1.09 mg/dL (ref 0.61–1.24)
GFR calc Af Amer: >60 mL/min (ref >60)
GFR calc non Af Amer: >60 mL/min (ref >60)
Glucose, Bld: 106 mg/dL — ABNORMAL HIGH (ref 70–99)
Potassium: 4.1 mmol/L (ref 3.5–5.1)
Sodium: 139 mmol/L (ref 135–145)

## 2020-01-28 LAB — CBC
HCT: 43.3 % (ref 39.0–52.0)
Hemoglobin: 13.9 g/dL (ref 13.0–17.0)
MCH: 25.6 pg — ABNORMAL LOW (ref 26.0–34.0)
MCHC: 32.1 g/dL (ref 30.0–36.0)
MCV: 79.6 fL — ABNORMAL LOW (ref 80.0–100.0)
Platelets: 232 10*3/uL (ref 150–400)
RBC: 5.44 MIL/uL (ref 4.22–5.81)
RDW: 13.9 % (ref 11.5–15.5)
WBC: 6.1 10*3/uL (ref 4.0–10.5)
nRBC: 0 % (ref 0.0–0.2)

## 2020-01-28 LAB — TROPONIN I (HIGH SENSITIVITY)
Troponin I (High Sensitivity): 2 ng/L (ref <18)
Troponin I (High Sensitivity): 3 ng/L (ref <18)

## 2020-01-28 MED ORDER — SODIUM CHLORIDE 0.9% FLUSH
3.0000 mL | Freq: Once | INTRAVENOUS | Status: AC
  Administered 2020-01-28: 3 mL via INTRAVENOUS

## 2020-01-28 MED ORDER — ACETAMINOPHEN 325 MG PO TABS
650.0000 mg | ORAL_TABLET | Freq: Once | ORAL | Status: AC
  Administered 2020-01-28: 650 mg via ORAL
  Filled 2020-01-28: qty 2

## 2020-01-28 NOTE — Telephone Encounter (Signed)
Per chart review tab pt is at [facility]. 
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
## 2020-01-28 NOTE — Telephone Encounter (Signed)
Noted. Will await eval.
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
## 2020-01-28 NOTE — Discharge Instructions (Signed)
If you develop recurrent, continued, or worsening chest pain, shortness of breath, fever, vomiting, abdominal or back pain, or any other new/concerning symptoms then return to the ER for evaluation.  

## 2020-01-28 NOTE — ED Triage Notes (Signed)
Patient c/o intermittent left chest pain that radiates into the upper back since yesterday. Patient also c/o nausea and had diaphoresis earlier,but not now. Patient called his PCP and was told to come to the ED.

## 2020-01-31 ENCOUNTER — Other Ambulatory Visit: Payer: Self-pay

## 2020-01-31 ENCOUNTER — Ambulatory Visit (INDEPENDENT_AMBULATORY_CARE_PROVIDER_SITE_OTHER): Payer: 59 | Admitting: Family Medicine

## 2020-01-31 ENCOUNTER — Encounter: Payer: Self-pay | Admitting: Family Medicine

## 2020-01-31 VITALS — BP 136/90 | HR 71 | Temp 97.9°F | Ht 76.0 in | Wt 211.6 lb

## 2020-01-31 DIAGNOSIS — L700 Acne vulgaris: Secondary | ICD-10-CM | POA: Diagnosis not present

## 2020-01-31 DIAGNOSIS — T300 Burn of unspecified body region, unspecified degree: Secondary | ICD-10-CM | POA: Diagnosis not present

## 2020-01-31 DIAGNOSIS — F410 Panic disorder [episodic paroxysmal anxiety] without agoraphobia: Secondary | ICD-10-CM

## 2020-01-31 DIAGNOSIS — M546 Pain in thoracic spine: Secondary | ICD-10-CM

## 2020-01-31 MED ORDER — LORAZEPAM 0.5 MG PO TABS: 0.2500 mg | ORAL_TABLET | Freq: Two times a day (BID) | ORAL | 0 refills | Status: DC | PRN

## 2020-01-31 MED ORDER — CYCLOBENZAPRINE HCL 10 MG PO TABS: 5.0000 mg | ORAL_TABLET | Freq: Three times a day (TID) | ORAL | 0 refills | Status: DC | PRN

## 2020-01-31 NOTE — Patient Instructions (Addendum)
Check with insurance on counselor - if needed let us know for referral.  May start lorazepam (ativan) 0.5mg  1/2-1 tablet twice daily as needed for anxiety.  May use muscle relaxant for back. Gentle stretching, heating pad. Let us know if this doesn't help.  We will refer you to dermatology for further evaluation.
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
## 2020-02-01 DIAGNOSIS — F410 Panic disorder [episodic paroxysmal anxiety] without agoraphobia: Secondary | ICD-10-CM | POA: Insufficient documentation

## 2020-02-01 DIAGNOSIS — L700 Acne vulgaris: Secondary | ICD-10-CM | POA: Insufficient documentation

## 2020-02-01 DIAGNOSIS — F418 Other specified anxiety disorders: Secondary | ICD-10-CM | POA: Insufficient documentation

## 2020-02-01 NOTE — Assessment & Plan Note (Addendum)
With chest pain, but recent reassuring ER evaluation. Anticipate MSK cause with anxiety contributing to worsening symptoms. Rx flexeril, NSAIDs, ice/heat, gentle stretching (handout provided). Reviewed flexeril sedation precautions.

## 2020-02-01 NOTE — Assessment & Plan Note (Signed)
Anticipate contributing to worsening symptoms - reviewed healthy stress relieving strategies. Will Rx lorazepam PRN airplane flight, anxiety attacks. Discussed counseling - he is interested - he will look into EAP at work and if unavailable will let us know for referral. Discussed pros and cons of controlled substances and expectations to receive prescription from our office. Patient is not to abuse, misuse, divert or use medication other than as prescribed. Patient is not to seek controlled substances from other clinics or multiple pharmacies. Patient is not to use illegal drugs. Discussed recommended short term use of med. Discussed risks of medication including dependence, tolerance, and addiction/abuse potential.
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
## 2020-02-01 NOTE — Assessment & Plan Note (Signed)
Worsening.  Will refer to derm.

## 2020-02-12 ENCOUNTER — Telehealth: Payer: Self-pay | Admitting: Family Medicine

## 2020-02-12 DIAGNOSIS — T300 Burn of unspecified body region, unspecified degree: Secondary | ICD-10-CM

## 2020-02-12 DIAGNOSIS — L80 Vitiligo: Secondary | ICD-10-CM

## 2020-02-12 DIAGNOSIS — T23292D Burn of second degree of multiple sites of left wrist and hand, subsequent encounter: Secondary | ICD-10-CM

## 2020-02-12 NOTE — Telephone Encounter (Signed)
Noted  

## 2020-02-12 NOTE — Telephone Encounter (Signed)
Pt said on his last visit 01/31/20 Dr. Darnell Level was going to put in referral to dermatology. He is following up because he hasn't heard anything. I looked at referrals and do not see this is placed. I asked him to give [place] a week to look into this since Dr. Darnell Level was out of the office.

## 2020-02-12 NOTE — Addendum Note (Signed)
Addended by: Ria Bush on: 02/12/2020 11:48 AM   Modules accepted: Orders

## 2020-02-12 NOTE — Telephone Encounter (Signed)
Referral placed  Thanks!

## 2020-04-27 ENCOUNTER — Encounter: Payer: Self-pay | Admitting: Family Medicine

## 2020-04-27 NOTE — Telephone Encounter (Signed)
Spoke with pt offering OV with an available provider.  Pt only wants to see Dr. Darnell Level.  Scheduled OV on 05/05/20 at 8:30.  Pt agreed to r/s if pain worsens.

## 2020-05-05 ENCOUNTER — Encounter: Payer: Self-pay | Admitting: Family Medicine

## 2020-05-05 ENCOUNTER — Ambulatory Visit (INDEPENDENT_AMBULATORY_CARE_PROVIDER_SITE_OTHER): Payer: 59 | Admitting: Family Medicine

## 2020-05-05 ENCOUNTER — Other Ambulatory Visit: Payer: Self-pay

## 2020-05-05 VITALS — BP 132/84 | HR 86 | Temp 98.3°F | Ht 76.0 in | Wt 213.2 lb

## 2020-05-05 DIAGNOSIS — Z23 Encounter for immunization: Secondary | ICD-10-CM | POA: Diagnosis not present

## 2020-05-05 DIAGNOSIS — R809 Proteinuria, unspecified: Secondary | ICD-10-CM | POA: Diagnosis not present

## 2020-05-05 DIAGNOSIS — N5089 Other specified disorders of the male genital organs: Secondary | ICD-10-CM | POA: Insufficient documentation

## 2020-05-05 DIAGNOSIS — N50812 Left testicular pain: Secondary | ICD-10-CM | POA: Diagnosis not present

## 2020-05-05 LAB — POC URINALSYSI DIPSTICK (AUTOMATED)
Bilirubin, UA: NEGATIVE
Blood, UA: NEGATIVE
Glucose, UA: NEGATIVE
Ketones, UA: NEGATIVE
Leukocytes, UA: NEGATIVE
Nitrite, UA: NEGATIVE
Protein, UA: POSITIVE — AB
Spec Grav, UA: 1.02 (ref 1.010–1.025)
Urobilinogen, UA: 0.2 E.U./dL
pH, UA: 6 (ref 5.0–8.0)

## 2020-05-05 NOTE — Assessment & Plan Note (Signed)
Incidentally noted today - check Umicroalb next labwork.

## 2020-05-05 NOTE — Assessment & Plan Note (Signed)
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 This week symptoms largely improving, reassurance provided. Check UA today. Reviewed scrotal supportive care. Update if not improving as expected.

## 2020-05-05 NOTE — Progress Notes (Signed)
This visit was conducted in person.  BP 132/84 (BP Location: Left Arm, Patient Position: Sitting, Cuff Size: Large)   Pulse 86   Temp 98.3 F (36.8 C) (Temporal)   Ht 6\' 4"  (1.93 m)   Wt 213 lb 3 oz (96.7 kg)   SpO2 98%   BMI 25.95 kg/m    CC: L groin pain x 3 weeks Subjective:    Patient ID: Keith Bishop, male    DOB: 21-Jan-1971, 49 y.o.   MRN: [id number]  HPI: ALPHONSO GREGSON is a 49 y.o. male presenting on 05/05/2020 for Groin Pain (C/o left groin pain.  Started about 3 wks ago.  Seems to have improved today.  H/o hernia. 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 Does normally take 600mg  ibuprofen several times a week.   No fevers/chills, nausea, abd pain, dysuria, urgency or frequency. No hematuria.   He's been wearing more supportive underwear with benefit.  S/p L inguinal hernia repair 2008.      Relevant past medical, surgical, family and social history reviewed and updated as indicated. Interim medical history since our last visit reviewed. Allergies and medications reviewed and updated. Outpatient Medications Prior to Visit  Medication Sig Dispense Refill  . Cholecalciferol (VITAMIN D3) 25 MCG (1000 UT) CAPS Take 1 capsule (1,000 Units total) by mouth daily. 30 capsule   . Cyanocobalamin (B-12) 1000 MCG SUBL Place 1 tablet under the tongue daily.    . cyclobenzaprine (FLEXERIL) 10 MG tablet Take 0.5-1 tablets (5-10 mg total) by mouth 3 (three) times daily as needed for muscle spasms (sedation precautions). 30 tablet 0  . doxycycline (VIBRA-TABS) 100 MG tablet Take 100 mg by mouth 2 (two) times daily.    Marland Kitchen LORazepam (ATIVAN) 0.5 MG tablet Take 0.5-1 tablets (0.25-0.5 mg total) by mouth 2 (two) times daily as needed for anxiety. 30 tablet 0  . naproxen sodium (ALEVE) 220 MG tablet Take 220 mg by mouth 2 (two) times daily as needed (pain/headache).      No  facility-administered medications prior to visit.     Per HPI unless specifically indicated in ROS section below Review of Systems Objective:  BP 132/84 (BP Location: Left Arm, Patient Position: Sitting, Cuff Size: Large)   Pulse 86   Temp 98.3 F (36.8 C) (Temporal)   Ht 6\' 4"  (1.93 m)   Wt 213 lb 3 oz (96.7 kg)   SpO2 98%   BMI 25.95 kg/m   Wt Readings from Last 3 Encounters:  05/05/20 213 lb 3 oz (96.7 kg)  01/31/20 211 lb 9 oz (96 kg)  01/28/20 200 lb (90.7 kg)      Physical Exam Vitals and nursing note reviewed.  Constitutional:      Appearance: Normal appearance. He is not ill-appearing.  Abdominal:     General: Abdomen is flat. Bowel sounds are normal. There is no distension.     Palpations: Abdomen is soft. There is no mass.     Tenderness: There is no abdominal tenderness. There is no guarding or rebound.     Hernia: No hernia is present.  Genitourinary:    Pubic Area: No rash.      Penis: Normal.      Testes:        Right: Mass, tenderness, swelling, testicular hydrocele or varicocele not present. Right testis is descended.  Left: Tenderness, swelling and varicocele (small) present. Mass or testicular hydrocele not present. Left testis is descended.     Epididymis:     Right: Normal.     Left: Normal.     Comments: Overall normal scrotal exam, mild discomfort and swelling to left testicle Musculoskeletal:        General: No tenderness. Normal range of motion.     Comments: No discomfort or weakness with testing groin muscles (hip flexion/abduction/adduction) against resistance  Skin:    General: Skin is warm and dry.     Findings: No rash.  Neurological:     Mental Status: He is alert.  Psychiatric:        Mood and Affect: Mood normal.        Behavior: Behavior normal.       Results for orders placed or performed in visit on 05/05/20  POCT Urinalysis Dipstick (Automated)  Result Value Ref Range   Color, UA yellow    Clarity, UA clear     Glucose, UA Negative Negative   Bilirubin, UA negative    Ketones, UA negative    Spec Grav, UA 1.020 1.010 - 1.025   Blood, UA negative    pH, UA 6.0 5.0 - 8.0   Protein, UA Positive (A) Negative   Urobilinogen, UA 0.2 0.2 or 1.0 E.U./dL   Nitrite, UA negative    Leukocytes, UA Negative Negative   Assessment & Plan:  This visit occurred during the SARS-CoV-2 public health emergency.  Safety protocols were in place, including screening questions prior to the visit, additional usage of staff PPE, and extensive cleaning of exam room while observing appropriate contact time as indicated for disinfecting solutions.   Problem List Items Addressed This Visit    Proteinuria    Incidentally noted today - check Umicroalb next labwork.       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 This week symptoms largely improving, reassurance provided. Check UA today. Reviewed scrotal supportive care. Update if not improving as expected.      Relevant Orders   POCT Urinalysis Dipstick (Automated) (Completed)    Other Visit Diagnoses    Need for influenza vaccination       Relevant Orders   Flu Vaccine QUAD 36+ mos IM (Completed)       No orders of the defined types were placed in this encounter.  Orders Placed This Encounter  Procedures  . Flu Vaccine QUAD 36+ mos IM  . POCT Urinalysis Dipstick (Automated)    Patient Instructions  Flu shot today  Exam overall ok today.  Urinalysis today.  Likely pinched area with residual inflammation that is now resolving. Continue supportive underwear, elevate scrotum, may continue ibuprofen as needed.    Follow up plan: Return if symptoms worsen or fail to improve.  Ria Bush, MD

## 2020-05-05 NOTE — Patient Instructions (Addendum)
Flu shot today  Exam overall ok today.  Urinalysis today.  Likely pinched area with residual inflammation that is now resolving. Continue supportive underwear, elevate scrotum, may continue ibuprofen as needed.

## 2020-10-16 ENCOUNTER — Other Ambulatory Visit: Payer: Self-pay | Admitting: Family Medicine

## 2020-10-16 NOTE — Telephone Encounter (Signed)
Name of Medication: Lorazepam Name of Pharmacy: [facility] [address] Last Fill or Written Date and Quantity: 01/31/20, #30 Last Office Visit and Type: 05/05/20, testicle pain Next Office Visit and Type: none Last Controlled Substance Agreement Date: none Last UDS: none

## 2020-10-18 NOTE — Telephone Encounter (Signed)
Erx

## 2021-03-01 ENCOUNTER — Encounter: Payer: Self-pay | Admitting: Family Medicine

## 2021-07-05 ENCOUNTER — Other Ambulatory Visit: Payer: Self-pay | Admitting: Family Medicine

## 2021-07-05 NOTE — Telephone Encounter (Signed)
Spoke to patient by telephone and was advised that he did request a refill on doxycycline. Patient stated that he was given this medication for a rash and the medication cleared the rash up. Patient stated now that he is off of the medication his face has broken out again with a rash.

## 2021-07-08 NOTE — Telephone Encounter (Signed)
Spoke with pt relaying Dr. G's message. Pt verbalizes understanding.  

## 2021-07-08 NOTE — Telephone Encounter (Signed)
It seems this was prescribed by dermatology - recommend he contact them

## 2021-07-19 ENCOUNTER — Encounter: Payer: Self-pay | Admitting: Family Medicine

## 2021-07-19 ENCOUNTER — Ambulatory Visit (INDEPENDENT_AMBULATORY_CARE_PROVIDER_SITE_OTHER): Payer: 59 | Admitting: Family Medicine

## 2021-07-19 ENCOUNTER — Other Ambulatory Visit: Payer: Self-pay

## 2021-07-19 VITALS — BP 140/86 | HR 94 | Temp 98.6°F | Ht 76.0 in | Wt 213.6 lb

## 2021-07-19 DIAGNOSIS — R809 Proteinuria, unspecified: Secondary | ICD-10-CM

## 2021-07-19 DIAGNOSIS — R21 Rash and other nonspecific skin eruption: Secondary | ICD-10-CM

## 2021-07-19 DIAGNOSIS — L590 Erythema ab igne [dermatitis ab igne]: Secondary | ICD-10-CM | POA: Insufficient documentation

## 2021-07-19 LAB — COMPREHENSIVE METABOLIC PANEL
ALT: 32 U/L (ref 0–53)
AST: 15 U/L (ref 0–37)
Albumin: 4.7 g/dL (ref 3.5–5.2)
Alkaline Phosphatase: 104 U/L (ref 39–117)
BUN: 16 mg/dL (ref 6–23)
CO2: 27 mEq/L (ref 19–32)
Calcium: 9.9 mg/dL (ref 8.4–10.5)
Chloride: 102 mEq/L (ref 96–112)
Creatinine, Ser: 1.29 mg/dL (ref 0.40–1.50)
GFR: 64.1 mL/min (ref >60.00)
Glucose, Bld: 125 mg/dL — ABNORMAL HIGH (ref 70–99)
Potassium: 4.2 mEq/L (ref 3.5–5.1)
Sodium: 140 mEq/L (ref 135–145)
Total Bilirubin: 0.7 mg/dL (ref 0.2–1.2)
Total Protein: 8.3 g/dL (ref 6.0–8.3)

## 2021-07-19 LAB — CBC WITH DIFFERENTIAL/PLATELET
Basophils Absolute: 0.1 10*3/uL (ref 0.0–0.1)
Basophils Relative: 0.6 % (ref 0.0–3.0)
Eosinophils Absolute: 0.2 10*3/uL (ref 0.0–0.7)
Eosinophils Relative: 1.8 % (ref 0.0–5.0)
HCT: 39.6 % (ref 39.0–52.0)
Hemoglobin: 12.9 g/dL — ABNORMAL LOW (ref 13.0–17.0)
Lymphocytes Relative: 14.6 % (ref 12.0–46.0)
Lymphs Abs: 1.3 10*3/uL (ref 0.7–4.0)
MCHC: 32.7 g/dL (ref 30.0–36.0)
MCV: 75.4 fl — ABNORMAL LOW (ref 78.0–100.0)
Monocytes Absolute: 0.5 10*3/uL (ref 0.1–1.0)
Monocytes Relative: 5.5 % (ref 3.0–12.0)
Neutro Abs: 6.9 10*3/uL (ref 1.4–7.7)
Neutrophils Relative %: 77.5 % — ABNORMAL HIGH (ref 43.0–77.0)
Platelets: 318 10*3/uL (ref 150.0–400.0)
RBC: 5.25 Mil/uL (ref 4.22–5.81)
RDW: 14.6 % (ref 11.5–15.5)
WBC: 8.9 10*3/uL (ref 4.0–10.5)

## 2021-07-19 LAB — MICROALBUMIN / CREATININE URINE RATIO
Creatinine,U: 55.5 mg/dL
Microalb Creat Ratio: 2.4 mg/g (ref 0.0–30.0)
Microalb, Ur: 1.3 mg/dL (ref 0.0–1.9)

## 2021-07-19 LAB — TSH: TSH: 0.88 u[IU]/mL (ref 0.35–5.50)

## 2021-07-19 LAB — C-REACTIVE PROTEIN: CRP: 8.2 mg/dL (ref 0.5–20.0)

## 2021-07-19 LAB — SEDIMENTATION RATE: Sed Rate: 54 mm/hr — ABNORMAL HIGH (ref 0–20)

## 2021-07-19 NOTE — Patient Instructions (Addendum)
Return for physical at your convenience. Labs today. If unrevealing, we will refer you to dermatology to discuss possible skin biopsy.

## 2021-07-19 NOTE — Assessment & Plan Note (Addendum)
Mottled erythematous macular rash localized to left side and left lower abdomen, of unclear cause as of yet. Check basic labs today including CBC, CMP, inflammatory markers and ANA. In h/o flushing, check tryptase r/o mastocytosis. Discussed possible dermatology referral for further evaluation. Pt agrees with plan.  No heating pad use.

## 2021-07-19 NOTE — Assessment & Plan Note (Signed)
H/o this. Check Umicroalb today .

## 2021-07-19 NOTE — Progress Notes (Signed)
Patient ID: Keith Bishop, male    DOB: 31-Aug-1970, 51 y.o.   MRN: [id number]  This visit was conducted in person.  BP 140/86    Pulse 94    Temp 98.6 F (37 C) (Temporal)    Ht 6\' 4"  (1.93 m)    Wt 213 lb 9 oz (96.9 kg)    SpO2 99%    BMI 26.00 kg/m    CC: rash Subjective:   HPI: Keith Bishop is a 51 y.o. male presenting on 07/19/2021 for Rash (C/o rash.  Started about 1.5 mos ago. )   1.5 mo h/o rash to left lateral side that over the past 1-2w ks seems to be spreading - to L belt line area. Itchy rash. Last week L>R lower back pain developed. He has been drinking more water recently. He does tend to sleep on his left side. Has tried calomine lotion without benefit.   No new lotions, detergents, soaps or shampoos. No new medications or supplements. No new foods.  He has been taking more multivitamins recently - but has stopped this.   No fevers/chills, new joint pains, abd pain, oral lesions. No red eyes. No unexpected weight changes. No heating pad use.   Notes ongoing night sweats and intermittent flushed feeling. Notes heat intolerance.  He did have some teeth removed 04/2021.   They changed laundry detergents without benefit.      Relevant past medical, surgical, family and social history reviewed and updated as indicated. Interim medical history since our last visit reviewed. Allergies and medications reviewed and updated. Outpatient Medications Prior to Visit  Medication Sig Dispense Refill   Cholecalciferol (VITAMIN D3) 25 MCG (1000 UT) CAPS Take 1 capsule (1,000 Units total) by mouth daily. 30 capsule    Cyanocobalamin (B-12) 1000 MCG SUBL Place 1 tablet under the tongue daily.     cyclobenzaprine (FLEXERIL) 10 MG tablet Take 0.5-1 tablets (5-10 mg total) by mouth 3 (three) times daily as needed for muscle spasms (sedation precautions). 30 tablet 0   LORazepam (ATIVAN) 0.5 MG tablet TAKE 0.5-1 TABLETS (0.25-0.5 MG TOTAL) BY MOUTH 2 (TWO) TIMES DAILY AS  NEEDED FOR ANXIETY. 30 tablet 0   naproxen sodium (ALEVE) 220 MG tablet Take 220 mg by mouth 2 (two) times daily as needed (pain/headache).      doxycycline (VIBRA-TABS) 100 MG tablet Take 100 mg by mouth 2 (two) times daily.     No facility-administered medications prior to visit.     Per HPI unless specifically indicated in ROS section below Review of Systems  Objective:  BP 140/86    Pulse 94    Temp 98.6 F (37 C) (Temporal)    Ht 6\' 4"  (1.93 m)    Wt 213 lb 9 oz (96.9 kg)    SpO2 99%    BMI 26.00 kg/m   Wt Readings from Last 3 Encounters:  07/19/21 213 lb 9 oz (96.9 kg)  05/05/20 213 lb 3 oz (96.7 kg)  01/31/20 211 lb 9 oz (96 kg)      Physical Exam Vitals and nursing note reviewed.  Constitutional:      Appearance: Normal appearance. He is not ill-appearing.  HENT:     Mouth/Throat:     Mouth: Mucous membranes are moist.     Pharynx: Oropharynx is clear. No oropharyngeal exudate or posterior oropharyngeal erythema.  Eyes:     Extraocular Movements: Extraocular movements intact.     Conjunctiva/sclera: Conjunctivae normal.  Pupils: Pupils are equal, round, and reactive to light.  Cardiovascular:     Rate and Rhythm: Normal rate and regular rhythm.     Pulses: Normal pulses.     Heart sounds: Normal heart sounds. No murmur heard. Pulmonary:     Effort: Pulmonary effort is normal. No respiratory distress.     Breath sounds: Normal breath sounds. No wheezing, rhonchi or rales.  Abdominal:     General: Bowel sounds are normal. There is no distension.     Palpations: Abdomen is soft. There is no hepatomegaly, splenomegaly or mass.     Tenderness: There is no abdominal tenderness. There is no guarding or rebound.     Hernia: No hernia is present.  Musculoskeletal:     Right lower leg: No edema.     Left lower leg: No edema.  Skin:    General: Skin is warm and dry.     Findings: Rash present.     Comments: Confluence of blanching erythematous macules/mottled skin to  left lateral side and left lower abdomen above belt line, rash is not papular or vesicular, no skin changes   Neurological:     Mental Status: He is alert.  Psychiatric:        Mood and Affect: Mood normal.        Behavior: Behavior normal.        Results for orders placed or performed in visit on 05/05/20  POCT Urinalysis Dipstick (Automated)  Result Value Ref Range   Color, UA yellow    Clarity, UA clear    Glucose, UA Negative Negative   Bilirubin, UA negative    Ketones, UA negative    Spec Grav, UA 1.020 1.010 - 1.025   Blood, UA negative    pH, UA 6.0 5.0 - 8.0   Protein, UA Positive (A) Negative   Urobilinogen, UA 0.2 0.2 or 1.0 E.U./dL   Nitrite, UA negative    Leukocytes, UA Negative Negative    Assessment & Plan:  This visit occurred during the SARS-CoV-2 public health emergency.  Safety protocols were in place, including screening questions prior to the visit, additional usage of staff PPE, and extensive cleaning of exam room while observing appropriate contact time as indicated for disinfecting solutions.   Problem List Items Addressed This Visit     Proteinuria    H/o this. Check Umicroalb today .      Relevant Orders   Microalbumin / creatinine urine ratio   Skin rash - Primary    Mottled erythematous macular rash localized to left side and left lower abdomen, of unclear cause as of yet. Check basic labs today including CBC, CMP, inflammatory markers and ANA. In h/o flushing, check tryptase r/o mastocytosis. Discussed possible dermatology referral for further evaluation. Pt agrees with plan.  No heating pad use.       Relevant Orders   Sedimentation rate   ANA   C-reactive protein   Comprehensive metabolic panel   TSH   CBC with Differential/Platelet   Tryptase     No orders of the defined types were placed in this encounter.  Orders Placed This Encounter  Procedures   Sedimentation rate   ANA   C-reactive protein   Comprehensive metabolic  panel   TSH   CBC with Differential/Platelet   Tryptase   Microalbumin / creatinine urine ratio     Patient Instructions  Return for physical at your convenience. Labs today. If unrevealing, we will refer you to dermatology  to discuss possible skin biopsy.   Follow up plan: Return if symptoms worsen or fail to improve.  Ria Bush, MD

## 2021-07-21 LAB — TEST AUTHORIZATION

## 2021-07-21 LAB — RPR: RPR Ser Ql: NONREACTIVE

## 2021-07-21 LAB — ANA: Anti Nuclear Antibody (ANA): NEGATIVE

## 2021-07-21 LAB — TRYPTASE: Tryptase: 6.9 mcg/L (ref <11.0)

## 2021-07-22 ENCOUNTER — Other Ambulatory Visit: Payer: Self-pay | Admitting: Family Medicine

## 2021-07-22 DIAGNOSIS — R21 Rash and other nonspecific skin eruption: Secondary | ICD-10-CM

## 2021-08-04 ENCOUNTER — Encounter: Payer: Self-pay | Admitting: *Deleted

## 2021-11-27 ENCOUNTER — Other Ambulatory Visit: Payer: Self-pay | Admitting: Family Medicine

## 2021-11-27 DIAGNOSIS — E559 Vitamin D deficiency, unspecified: Secondary | ICD-10-CM

## 2021-11-27 DIAGNOSIS — R21 Rash and other nonspecific skin eruption: Secondary | ICD-10-CM

## 2021-11-27 DIAGNOSIS — E785 Hyperlipidemia, unspecified: Secondary | ICD-10-CM | POA: Insufficient documentation

## 2021-11-27 DIAGNOSIS — Z125 Encounter for screening for malignant neoplasm of prostate: Secondary | ICD-10-CM

## 2021-11-27 DIAGNOSIS — Z1159 Encounter for screening for other viral diseases: Secondary | ICD-10-CM

## 2021-11-27 DIAGNOSIS — R7982 Elevated C-reactive protein (CRP): Secondary | ICD-10-CM

## 2021-11-27 DIAGNOSIS — E538 Deficiency of other specified B group vitamins: Secondary | ICD-10-CM

## 2021-11-29 ENCOUNTER — Other Ambulatory Visit (INDEPENDENT_AMBULATORY_CARE_PROVIDER_SITE_OTHER): Payer: 59

## 2021-11-29 DIAGNOSIS — E538 Deficiency of other specified B group vitamins: Secondary | ICD-10-CM

## 2021-11-29 DIAGNOSIS — R7982 Elevated C-reactive protein (CRP): Secondary | ICD-10-CM

## 2021-11-29 DIAGNOSIS — Z125 Encounter for screening for malignant neoplasm of prostate: Secondary | ICD-10-CM

## 2021-11-29 DIAGNOSIS — E785 Hyperlipidemia, unspecified: Secondary | ICD-10-CM | POA: Diagnosis not present

## 2021-11-29 DIAGNOSIS — E559 Vitamin D deficiency, unspecified: Secondary | ICD-10-CM | POA: Diagnosis not present

## 2021-11-29 DIAGNOSIS — Z1159 Encounter for screening for other viral diseases: Secondary | ICD-10-CM

## 2021-11-29 LAB — BASIC METABOLIC PANEL
BUN: 13 mg/dL (ref 6–23)
CO2: 27 mEq/L (ref 19–32)
Calcium: 9 mg/dL (ref 8.4–10.5)
Chloride: 106 mEq/L (ref 96–112)
Creatinine, Ser: 1.25 mg/dL (ref 0.40–1.50)
GFR: 66.4 mL/min (ref >60.00)
Glucose, Bld: 122 mg/dL — ABNORMAL HIGH (ref 70–99)
Potassium: 4.3 mEq/L (ref 3.5–5.1)
Sodium: 142 mEq/L (ref 135–145)

## 2021-11-29 LAB — CBC WITH DIFFERENTIAL/PLATELET
Basophils Absolute: 0 10*3/uL (ref 0.0–0.1)
Basophils Relative: 0.5 % (ref 0.0–3.0)
Eosinophils Absolute: 0.2 10*3/uL (ref 0.0–0.7)
Eosinophils Relative: 2.2 % (ref 0.0–5.0)
HCT: 37.7 % — ABNORMAL LOW (ref 39.0–52.0)
Hemoglobin: 12.3 g/dL — ABNORMAL LOW (ref 13.0–17.0)
Lymphocytes Relative: 13.8 % (ref 12.0–46.0)
Lymphs Abs: 1.1 10*3/uL (ref 0.7–4.0)
MCHC: 32.6 g/dL (ref 30.0–36.0)
MCV: 74.5 fl — ABNORMAL LOW (ref 78.0–100.0)
Monocytes Absolute: 0.5 10*3/uL (ref 0.1–1.0)
Monocytes Relative: 5.8 % (ref 3.0–12.0)
Neutro Abs: 6 10*3/uL (ref 1.4–7.7)
Neutrophils Relative %: 77.7 % — ABNORMAL HIGH (ref 43.0–77.0)
Platelets: 259 10*3/uL (ref 150.0–400.0)
RBC: 5.06 Mil/uL (ref 4.22–5.81)
RDW: 15.2 % (ref 11.5–15.5)
WBC: 7.7 10*3/uL (ref 4.0–10.5)

## 2021-11-29 LAB — LIPID PANEL
Cholesterol: 174 mg/dL (ref 0–200)
HDL: 25.4 mg/dL — ABNORMAL LOW (ref >39.00)
LDL Cholesterol: 112 mg/dL — ABNORMAL HIGH (ref 0–99)
NonHDL: 149.08
Total CHOL/HDL Ratio: 7
Triglycerides: 183 mg/dL — ABNORMAL HIGH (ref 0.0–149.0)
VLDL: 36.6 mg/dL (ref 0.0–40.0)

## 2021-11-29 LAB — IBC PANEL
Iron: 29 ug/dL — ABNORMAL LOW (ref 42–165)
Saturation Ratios: 11.6 % — ABNORMAL LOW (ref 20.0–50.0)
TIBC: 250.6 ug/dL (ref 250.0–450.0)
Transferrin: 179 mg/dL — ABNORMAL LOW (ref 212.0–360.0)

## 2021-11-29 LAB — VITAMIN B12: Vitamin B-12: 218 pg/mL (ref 211–911)

## 2021-11-29 LAB — PSA: PSA: 0.35 ng/mL (ref 0.10–4.00)

## 2021-11-29 LAB — SEDIMENTATION RATE: Sed Rate: 53 mm/hr — ABNORMAL HIGH (ref 0–20)

## 2021-11-29 LAB — VITAMIN D 25 HYDROXY (VIT D DEFICIENCY, FRACTURES): VITD: 22.84 ng/mL — ABNORMAL LOW (ref 30.00–100.00)

## 2021-11-29 LAB — FERRITIN: Ferritin: 172.9 ng/mL (ref 22.0–322.0)

## 2021-11-30 LAB — HEPATITIS C ANTIBODY
Hepatitis C Ab: NONREACTIVE
SIGNAL TO CUT-OFF: 0.15 (ref <1.00)

## 2021-12-06 ENCOUNTER — Ambulatory Visit (INDEPENDENT_AMBULATORY_CARE_PROVIDER_SITE_OTHER): Payer: 59 | Admitting: Family Medicine

## 2021-12-06 ENCOUNTER — Encounter: Payer: Self-pay | Admitting: Family Medicine

## 2021-12-06 VITALS — BP 136/90 | HR 80 | Temp 97.6°F | Ht 74.0 in | Wt 212.0 lb

## 2021-12-06 DIAGNOSIS — L590 Erythema ab igne [dermatitis ab igne]: Secondary | ICD-10-CM

## 2021-12-06 DIAGNOSIS — E559 Vitamin D deficiency, unspecified: Secondary | ICD-10-CM

## 2021-12-06 DIAGNOSIS — L80 Vitiligo: Secondary | ICD-10-CM

## 2021-12-06 DIAGNOSIS — E538 Deficiency of other specified B group vitamins: Secondary | ICD-10-CM

## 2021-12-06 DIAGNOSIS — D509 Iron deficiency anemia, unspecified: Secondary | ICD-10-CM

## 2021-12-06 DIAGNOSIS — Z87891 Personal history of nicotine dependence: Secondary | ICD-10-CM | POA: Diagnosis not present

## 2021-12-06 DIAGNOSIS — Z0000 Encounter for general adult medical examination without abnormal findings: Secondary | ICD-10-CM

## 2021-12-06 DIAGNOSIS — E785 Hyperlipidemia, unspecified: Secondary | ICD-10-CM

## 2021-12-06 DIAGNOSIS — R739 Hyperglycemia, unspecified: Secondary | ICD-10-CM

## 2021-12-06 DIAGNOSIS — R7982 Elevated C-reactive protein (CRP): Secondary | ICD-10-CM

## 2021-12-06 DIAGNOSIS — R5382 Chronic fatigue, unspecified: Secondary | ICD-10-CM

## 2021-12-06 DIAGNOSIS — K056 Periodontal disease, unspecified: Secondary | ICD-10-CM

## 2021-12-06 NOTE — Assessment & Plan Note (Signed)
Chronic, updated iron levels show low levels with low transferrin, normal ferritin (?due to acute phase reactant in possible h/o chronic inflammatory state). See below. No additional iron at this time.

## 2021-12-06 NOTE — Assessment & Plan Note (Signed)
Preventative protocols reviewed and updated unless pt declined. Discussed healthy diet and lifestyle.  

## 2021-12-06 NOTE — Assessment & Plan Note (Addendum)
Remains abstinent.  Eligible for lung cancer screening program, and interested - will refer.

## 2021-12-06 NOTE — Assessment & Plan Note (Signed)
This could contribute to fatigue, night sweats,. S/p deep cleaning/scaling of gums 06/2021 with improvement in chronic night sweats at that time. Has periodontist f/u planned 01/2022.

## 2021-12-06 NOTE — Assessment & Plan Note (Signed)
Rec restart 1067mg oral b12 daily

## 2021-12-06 NOTE — Patient Instructions (Addendum)
We will refer you to lung cancer screening program.  Consider shingrix vaccine.  Restart vitamin D and B12 daily for supplementation.  Good to see you today  Return as needed or in 1 year for next physical.  Return in 6 months for lab visit only.   Health Maintenance, Male Adopting a healthy lifestyle and getting preventive care are important in promoting health and wellness. Ask your health care provider about: The right schedule for you to have regular tests and exams. Things you can do on your own to prevent diseases and keep yourself healthy. What should I know about diet, weight, and exercise? Eat a healthy diet  Eat a diet that includes plenty of vegetables, fruits, low-fat dairy products, and lean protein. Do not eat a lot of foods that are high in solid fats, added sugars, or sodium. Maintain a healthy weight Body mass index (BMI) is a measurement that can be used to identify possible weight problems. It estimates body fat based on height and weight. Your health care provider can help determine your BMI and help you achieve or maintain a healthy weight. Get regular exercise Get regular exercise. This is one of the most important things you can do for your health. Most adults should: Exercise for at least 150 minutes each week. The exercise should increase your heart rate and make you sweat (moderate-intensity exercise). Do strengthening exercises at least twice a week. This is in addition to the moderate-intensity exercise. Spend less time sitting. Even light physical activity can be beneficial. Watch cholesterol and blood lipids Have your blood tested for lipids and cholesterol at 52 years of age, then have this test every 5 years. You may need to have your cholesterol levels checked more often if: Your lipid or cholesterol levels are high. You are older than 52 years of age. You are at high risk for heart disease. What should I know about cancer screening? Many types of  cancers can be detected early and may often be prevented. Depending on your health history and family history, you may need to have cancer screening at various ages. This may include screening for: Colorectal cancer. Prostate cancer. Skin cancer. Lung cancer. What should I know about heart disease, diabetes, and high blood pressure? Blood pressure and heart disease High blood pressure causes heart disease and increases the risk of stroke. This is more likely to develop in people who have high blood pressure readings or are overweight. Talk with your health care provider about your target blood pressure readings. Have your blood pressure checked: Every 3-5 years if you are 24-48 years of age. Every year if you are 71 years old or older. If you are between the ages of 44 and 1 and are a current or former smoker, ask your health care provider if you should have a one-time screening for abdominal aortic aneurysm (AAA). Diabetes Have regular diabetes screenings. This checks your fasting blood sugar level. Have the screening done: Once every three years after age 36 if you are at a normal weight and have a low risk for diabetes. More often and at a younger age if you are overweight or have a high risk for diabetes. What should I know about preventing infection? Hepatitis B If you have a higher risk for hepatitis B, you should be screened for this virus. Talk with your health care provider to find out if you are at risk for hepatitis B infection. Hepatitis C Blood testing is recommended for: Everyone born from  1946 through [birth year]. Anyone with known risk factors for hepatitis C. Sexually transmitted infections (STIs) You should be screened each year for STIs, including gonorrhea and chlamydia, if: You are sexually active and are younger than 52 years of age. You are older than 52 years of age and your health care provider tells you that you are at risk for this type of infection. Your sexual  activity has changed since you were last screened, and you are at increased risk for chlamydia or gonorrhea. Ask your health care provider if you are at risk. Ask your health care provider about whether you are at high risk for HIV. Your health care provider may recommend a prescription medicine to help prevent HIV infection. If you choose to take medicine to prevent HIV, you should first get tested for HIV. You should then be tested every 3 months for as long as you are taking the medicine. Follow these instructions at home: Alcohol use Do not drink alcohol if your health care provider tells you not to drink. If you drink alcohol: Limit how much you have to 0-2 drinks a day. Know how much alcohol is in your drink. In the [place] one drink equals one 12 oz bottle of beer (355 mL), one 5 oz glass of wine (148 mL), or one 1 oz glass of hard liquor (44 mL). Lifestyle Do not use any products that contain nicotine or tobacco. These products include cigarettes, chewing tobacco, and vaping devices, such as e-cigarettes. If you need help quitting, ask your health care provider. Do not use street drugs. Do not share needles. Ask your health care provider for help if you need support or information about quitting drugs. General instructions Schedule regular health, dental, and eye exams. Stay current with your vaccines. Tell your health care provider if: You often feel depressed. You have ever been abused or do not feel safe at home. Summary Adopting a healthy lifestyle and getting preventive care are important in promoting health and wellness. Follow your health care provider's instructions about healthy diet, exercising, and getting tested or screened for diseases. Follow your health care provider's instructions on monitoring your cholesterol and blood pressure. This information is not intended to replace advice given to you by your health care provider. Make sure you discuss any questions you have  with your health care provider. Document Revised: 11/02/2020 Document Reviewed: 11/02/2020 Elsevier Patient Education  [place].
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
## 2021-12-06 NOTE — Assessment & Plan Note (Signed)
rec restart 1000 IU vit D3 daily.

## 2021-12-07 NOTE — Assessment & Plan Note (Signed)
Chronic, stable off statin.  Encouraged healthy diet to help control cholesterol levels. The 10-year ASCVD risk score (Arnett DK, et al., 2019) is: 7.6%   Values used to calculate the score:     Age: 51 years     Sex: Male     Is Non-Hispanic African American: No     Diabetic: No     Tobacco smoker: No     Systolic Blood Pressure: 621 mmHg     Is BP treated: No     HDL Cholesterol: 25.4 mg/dL     Total Cholesterol: 174 mg/dL

## 2021-12-07 NOTE — Assessment & Plan Note (Addendum)
H/o this, normal ferritin with low iron and % sat, but also low transferrin. Anticipate component of poor iron absorption.  Anticipate component of chronic inflammation - continue periodontist f/u.  Consider checking Hgb electrophoresis for evaluation of thalassemia.

## 2021-12-07 NOTE — Assessment & Plan Note (Signed)
Saw derm, suspicious for this. Appreciate derm eval.

## 2022-03-02 ENCOUNTER — Other Ambulatory Visit: Payer: 59

## 2022-03-02 ENCOUNTER — Ambulatory Visit (HOSPITAL_COMMUNITY)
Admission: RE | Admit: 2022-03-02 | Discharge: 2022-03-02 | Disposition: A | Discharge status: Home / Self Care | Payer: 59 | Source: Ambulatory Visit | Attending: Nurse Practitioner | Admitting: Nurse Practitioner

## 2022-03-02 ENCOUNTER — Ambulatory Visit (INDEPENDENT_AMBULATORY_CARE_PROVIDER_SITE_OTHER): Payer: 59 | Admitting: Nurse Practitioner

## 2022-03-02 ENCOUNTER — Encounter: Payer: Self-pay | Admitting: Nurse Practitioner

## 2022-03-02 VITALS — BP 140/82 | HR 67 | Temp 97.3°F | Resp 14 | Ht 74.0 in | Wt 213.2 lb

## 2022-03-02 DIAGNOSIS — R600 Localized edema: Secondary | ICD-10-CM

## 2022-03-02 NOTE — Progress Notes (Signed)
Acute Office Visit  Subjective:     Patient ID: Keith Bishop, male    DOB: [birth year], 51 y.o.   MRN: [id number]  Chief Complaint  Patient presents with   Insect Bite    Thinks he got bit by something on 02/27/22 on left ankle, swelling of left leg present, some better. Does have some pain on the inside of left knee and felt a knot under the knee area.    HPI Patient is in today for   States that he thought he got bite by something on Sunday. States that he did feel something but thought it was a piece of grass. States that it started itching. States he was out in the shed for approx 1 -1.5 hours. States that he noticed that the left leg was swollen. States that he did try a compression garment and states that the lower leg was better. He noticed a "lump" to posterior upper leg also Patient has no history of DVT or PE.  No recent surgery.  No recent long travel without breaks.  He does work as a Secondary school teacher and states he is "too large for the vehicle".  States that he does have to cock his knee to the side in order to fit into the vehicle.    Review of Systems  Constitutional:  Negative for chills and fever.  Respiratory:  Negative for shortness of breath.   Cardiovascular:  Positive for leg swelling. Negative for chest pain.  Skin:  Positive for itching.  Psychiatric/Behavioral:  The patient is nervous/anxious.         Objective:    BP (!) 140/82   Pulse 67   Temp (!) 97.3 F (36.3 C)   Resp 14   Ht '6\' 2"'$  (1.88 m)   Wt 213 lb 4 oz (96.7 kg)   SpO2 98%   BMI 27.38 kg/m  BP Readings from Last 3 Encounters:  03/02/22 (!) 140/82  12/06/21 136/90  07/19/21 140/86   Wt Readings from Last 3 Encounters:  03/02/22 213 lb 4 oz (96.7 kg)  12/06/21 212 lb (96.2 kg)  07/19/21 213 lb 9 oz (96.9 kg)      Physical Exam Vitals and nursing note reviewed.  Constitutional:      Appearance: Normal appearance.  Cardiovascular:     Rate and Rhythm: Normal rate and regular  rhythm.     Pulses: Normal pulses.          Dorsalis pedis pulses are 2+ on the right side and 2+ on the left side.       Posterior tibial pulses are 2+ on the right side and 2+ on the left side.     Heart sounds: Normal heart sounds.  Pulmonary:     Effort: Pulmonary effort is normal.     Breath sounds: Normal breath sounds.  Musculoskeletal:     Right lower leg: No edema.     Left lower leg: Edema present.     Comments: 37 cm  on the right leg 38 cm on the left leg  Some tenderness to palpation to posterior leg. Patient is tender to posterior upper leg.  No discrete mass appreciated   Neurological:     Mental Status: He is alert.     No results found for any visits on 03/02/22.      Assessment & Plan:   Problem List Items Addressed This Visit       Other   Edema of left lower leg -  Primary    After patient got "bit" by something.  No discrete bite mark.  Did have some tenderness and unilateral swelling will order stat ultrasound rule out DVT.  Patient's leg in dependent area for extended period of time today.  Did inform her to elevate as much as possible.  Can continue using compression garment as he sees fit.  Pending ultrasound results      Relevant Orders   VAS [place] LOWER EXTREMITY VENOUS (DVT)    No orders of the defined types were placed in this encounter.   Return if symptoms worsen or fail to improve.  Romilda Garret, NP

## 2022-03-02 NOTE — Progress Notes (Signed)
Lower extremity venous has been completed.   Preliminary results in CV Proc.   Keith Bishop 03/02/2022 1:03 PM

## 2022-03-02 NOTE — Assessment & Plan Note (Signed)
After patient got "bit" by something.  No discrete bite mark.  Did have some tenderness and unilateral swelling will order stat ultrasound rule out DVT.  Patient's leg in dependent area for extended period of time today.  Did inform her to elevate as much as possible.  Can continue using compression garment as he sees fit.  Pending ultrasound results

## 2022-03-02 NOTE — Patient Instructions (Signed)
Nice to see you today I will be in touch with the ultrasound results Continue taking the benadryl if you have itching Wear the compression garment and elevate the leg when you can Follow up if no improvement

## 2022-03-28 ENCOUNTER — Encounter: Payer: Self-pay | Admitting: Internal Medicine

## 2022-03-28 ENCOUNTER — Ambulatory Visit (INDEPENDENT_AMBULATORY_CARE_PROVIDER_SITE_OTHER): Payer: 59 | Admitting: Internal Medicine

## 2022-03-28 VITALS — BP 136/88 | HR 78 | Temp 98.1°F | Ht 74.0 in | Wt 217.0 lb

## 2022-03-28 DIAGNOSIS — I872 Venous insufficiency (chronic) (peripheral): Secondary | ICD-10-CM | POA: Insufficient documentation
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
## 2022-03-28 NOTE — Assessment & Plan Note (Signed)
Related to having to keep legs still at work Does get out of truck at times---that helps Discussed support socks

## 2022-04-01 ENCOUNTER — Ambulatory Visit
Admission: EM | Admit: 2022-04-01 | Discharge: 2022-04-01 | Disposition: A | Discharge status: Home / Self Care | Payer: 59

## 2022-04-01 DIAGNOSIS — R2242 Localized swelling, mass and lump, left lower limb: Secondary | ICD-10-CM

## 2022-04-01 NOTE — ED Triage Notes (Signed)
Patient presents to UC for right leg swelling x 1 month. Pt states has been an on-going issue since 09/06. He states he has been using compression sock as advised but feels that it helps with lower leg swelling but has noted increased swelling to thigh. Only changes he has noted is has been eating pretzels.

## 2022-04-01 NOTE — Discharge Instructions (Signed)
Please follow-up with primary care doctor and vascular specialist for further evaluation and management.  I have attached educational information and instructions as well.
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
## 2022-04-04 ENCOUNTER — Telehealth: Payer: Self-pay

## 2022-04-04 DIAGNOSIS — R591 Generalized enlarged lymph nodes: Secondary | ICD-10-CM

## 2022-04-04 DIAGNOSIS — I872 Venous insufficiency (chronic) (peripheral): Secondary | ICD-10-CM

## 2022-04-04 NOTE — Telephone Encounter (Addendum)
Per chart review tab pt was seen 04/01/22 at [facility]. Sending note to Dr Danise Mina.   [facility] Night - Client TELEPHONE ADVICE RECORD [facility] Patient Name: Keith Bishop Gender: Male DOB: Nov 04, 1970 Age: 51 Y 64 M 3 D Return Phone Number: [phone] (Primary) Address: City/ State/ Zip: [place]  [address] Client  [facility] Night - Client Client Site [place] Provider Viviana Simpler- MD Contact Type Call Who Is Calling Patient / Member / Family / Caregiver Call Type Triage / Clinical Relationship To Patient Self Return Phone Number Please choose phone number Chief Complaint Leg Swelling And Edema Reason for Call Symptomatic / Request for Health Information Initial Comment Caller is returning call. He saw doctor a month ago about swollen leg. Still swollen and moving up into groin area. Left leg. Translation No Nurse Assessment Nurse: Arvella Nigh, RN, Lubertha Basque Date/Time (Eastern Time): 04/01/2022 5:56:26 PM Confirm and document reason for call. If symptomatic, describe symptoms. ---Caller states he saw the doctor a month ago for an ultrasound of the left leg that revealed no blood clots. States after swelling did not go away he went back to doctor who told him to wear compression socks. States since then all the swelling has went to knee and now groin. Does the patient have any new or worsening symptoms? ---Yes Will a triage be completed? ---Yes Related visit to physician within the last 2 weeks? ---No Does the PT have any chronic conditions? (i.e. diabetes, asthma, this includes High risk factors for pregnancy, etc.) ---No Is this a behavioral health or substance abuse call? ---No Guidelines Guideline Title Affirmed Question Affirmed Notes Nurse Date/Time Eilene Ghazi Time) Leg Swelling and Edema SEVERE leg swelling (e.g., swelling extends above knee, entire leg is  swollen, weeping fluid) Arvella Nigh, RN, [place] 04/01/2022 5:59:49 PM PLEASE NOTE: All timestamps contained within this report are represented as [place] Standard Time. CONFIDENTIALTY NOTICE: This fax transmission is intended only for the addressee. It contains information that is legally privileged, confidential or otherwise protected from use or disclosure. If you are not the intended recipient, you are strictly prohibited from reviewing, disclosing, copying using or disseminating any of this information or taking any action in reliance on or regarding this information. If you have received this fax in error, please notify us immediately by telephone so that we can arrange for its return to [place]. Phone: [phone], Toll-Free: [phone], Fax: [phone] Page: 2 of 2 Call Id: [id number] [place]. Time Eilene Ghazi Time) Disposition Final User 04/01/2022 6:02:36 PM See HCP within 4 Hours (or PCP triage) Yes Arvella Nigh, RN, [place]ubertha Basque Final Disposition 04/01/2022 6:02:36 PM See HCP within 4 Hours (or PCP triage) Yes Arvella Nigh, RN, [place] Disagree/Comply Comply Caller Understands Yes PreDisposition [place] Advice Given Per Guideline SEE HCP (OR PCP TRIAGE) WITHIN 4 HOURS: * IF OFFICE WILL BE CLOSED AND NO PCP (PRIMARY CARE PROVIDER) SECOND-LEVEL TRIAGE: You need to be seen within the next 3 or 4 hours. A nearby Urgent Care Center [facility]) is often a good source of care. Another choice is to go to the ED. Go sooner if you become worse. CALL BACK IF: * You become worse CARE ADVICE given per Leg Swelling and Edema (Adult) guideline. Referrals GO TO FACILITY REFUSED GO TO [place] - Client TELEPHONE ADVICE RECORD [facility] Patient Name: SHOOTER TANGEN Gender: Male DOB: [birth year] Age: 59 Y 5 M 3 D Return  Phone Number: [phone] (Primary) Address: City/ State/ Zip: [place]  [address] Client [facility] - Client Client Site [place] Provider Ria Bush - MD Contact Type Call Who Is Calling Patient / Member / Family / Caregiver Call Type Triage / Clinical Relationship To Patient Self Return Phone Number [phone] (Primary) Chief Complaint Leg Swelling And Edema Reason for Call Symptomatic / Request for Health Information Initial Comment Caller states that he has swelling in his leg. He was told to wear compression socks, but the fluid is moving up to his groin. There is also pain. Translation No Disp. Time Eilene Ghazi Time) Disposition Final User 04/01/2022 5:04:33 PM Attempt made - message left Joya Gaskins, RN, [place] 04/01/2022 5:31:50 PM FINAL ATTEMPT MADE - message left Yes Joya Gaskins RN, [place] Final Disposition 04/01/2022 5:31:50 PM FINAL ATTEMPT MADE - message left Yes Joya Gaskins, RN, Arva Chafe

## 2022-04-05 NOTE — Telephone Encounter (Signed)
Plz call for update on leg swelling with compression stocking use.  If ongoing, see if he'd like to see vascular surgeon.

## 2022-04-06 NOTE — Telephone Encounter (Signed)
Patient called back in and was wanting to know if a referral could be put in before he comes to his appointment to a vein and vascular doctor.

## 2022-04-06 NOTE — Telephone Encounter (Signed)
Spoke to patient by telephone and was advised that he does still have swelling in his leg. Patient stated that he is using the compression stockings which has not helped much. Patient stated that the compression stockings just pushed the fluid up higher in his leg. Patient stated that he is not having any pain in his leg. Patient stated that he is okay with seeing a vascular surgeon but would  like to come in and discuss this with Dr. Danise Mina first. Patient scheduled for an appointment with Dr. Danise Mina 04/13/22 at 2:00 pm. Patient was given ER precautions and he verbalized understanding.

## 2022-04-06 NOTE — Addendum Note (Signed)
Addended by: Ria Bush on: 04/06/2022 01:36 PM   Modules accepted: Orders

## 2022-04-06 NOTE — Telephone Encounter (Signed)
VVS referral placed to [facility]

## 2022-04-06 NOTE — Telephone Encounter (Signed)
Patient notified by telephone that referral has been placed and he will hear back from one of the referral coordinators. Patient again was given ER precautions and she verbalized understanding.

## 2022-04-09 ENCOUNTER — Other Ambulatory Visit: Payer: Self-pay | Admitting: Family Medicine

## 2022-04-11 NOTE — Telephone Encounter (Signed)
Name of Medication: Lorazepam Name of Pharmacy: [facility] or Written Date and Quantity: 10/18/20, #30 Last Office Visit and Type: 12/06/21, CPE Next Office Visit and Type: 10/185/23, ER f/u Last Controlled Substance Agreement Date: none Last UDS: none

## 2022-04-13 ENCOUNTER — Other Ambulatory Visit: Payer: Self-pay

## 2022-04-13 ENCOUNTER — Ambulatory Visit (INDEPENDENT_AMBULATORY_CARE_PROVIDER_SITE_OTHER): Payer: 59 | Admitting: Family Medicine

## 2022-04-13 ENCOUNTER — Encounter: Payer: Self-pay | Admitting: Family Medicine

## 2022-04-13 ENCOUNTER — Ambulatory Visit (INDEPENDENT_AMBULATORY_CARE_PROVIDER_SITE_OTHER)
Admission: RE | Admit: 2022-04-13 | Discharge: 2022-04-13 | Disposition: A | Discharge status: Home / Self Care | Payer: 59 | Source: Ambulatory Visit | Attending: Family Medicine | Admitting: Family Medicine

## 2022-04-13 VITALS — BP 136/90 | HR 79 | Temp 97.6°F | Ht 74.0 in | Wt 213.0 lb

## 2022-04-13 DIAGNOSIS — R739 Hyperglycemia, unspecified: Secondary | ICD-10-CM

## 2022-04-13 DIAGNOSIS — E559 Vitamin D deficiency, unspecified: Secondary | ICD-10-CM

## 2022-04-13 DIAGNOSIS — M546 Pain in thoracic spine: Secondary | ICD-10-CM

## 2022-04-13 DIAGNOSIS — R599 Enlarged lymph nodes, unspecified: Secondary | ICD-10-CM

## 2022-04-13 DIAGNOSIS — I872 Venous insufficiency (chronic) (peripheral): Secondary | ICD-10-CM | POA: Diagnosis not present

## 2022-04-13 DIAGNOSIS — E538 Deficiency of other specified B group vitamins: Secondary | ICD-10-CM

## 2022-04-13 DIAGNOSIS — Z23 Encounter for immunization: Secondary | ICD-10-CM | POA: Diagnosis not present

## 2022-04-13 DIAGNOSIS — D509 Iron deficiency anemia, unspecified: Secondary | ICD-10-CM

## 2022-04-13 DIAGNOSIS — M7989 Other specified soft tissue disorders: Secondary | ICD-10-CM | POA: Insufficient documentation

## 2022-04-13 DIAGNOSIS — R600 Localized edema: Secondary | ICD-10-CM

## 2022-04-13 DIAGNOSIS — R5382 Chronic fatigue, unspecified: Secondary | ICD-10-CM

## 2022-04-13 DIAGNOSIS — L80 Vitiligo: Secondary | ICD-10-CM

## 2022-04-13 DIAGNOSIS — R7982 Elevated C-reactive protein (CRP): Secondary | ICD-10-CM

## 2022-04-13 DIAGNOSIS — R591 Generalized enlarged lymph nodes: Secondary | ICD-10-CM

## 2022-04-13 DIAGNOSIS — F410 Panic disorder [episodic paroxysmal anxiety] without agoraphobia: Secondary | ICD-10-CM

## 2022-04-13 DIAGNOSIS — R61 Generalized hyperhidrosis: Secondary | ICD-10-CM

## 2022-04-13 MED ORDER — VITAMIN D3 25 MCG (1000 UT) PO CAPS: 1.0000 | ORAL_CAPSULE | Freq: Every day | ORAL | Status: DC

## 2022-04-13 MED ORDER — METHOCARBAMOL 500 MG PO TABS: 500.0000 mg | ORAL_TABLET | Freq: Three times a day (TID) | ORAL | 0 refills | Status: DC | PRN

## 2022-04-13 MED ORDER — LORAZEPAM 0.5 MG PO TABS: 0.2500 mg | ORAL_TABLET | Freq: Two times a day (BID) | ORAL | 0 refills | Status: DC | PRN

## 2022-04-13 MED ORDER — VITAMIN B-12 1000 MCG PO TABS: 1000.0000 ug | ORAL_TABLET | Freq: Every day | ORAL | Status: DC

## 2022-04-13 NOTE — Patient Instructions (Addendum)
Flu shot today Labs today  Xray today  I will order CT scan abdomen pelvis for further evaluation of left leg swelling. Restart b12 and D Try robaxin muscle relaxant for back.
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
## 2022-04-14 ENCOUNTER — Other Ambulatory Visit: Payer: Self-pay | Admitting: *Deleted

## 2022-04-14 DIAGNOSIS — R600 Localized edema: Secondary | ICD-10-CM

## 2022-04-14 DIAGNOSIS — R61 Generalized hyperhidrosis: Secondary | ICD-10-CM | POA: Insufficient documentation

## 2022-04-14 LAB — CBC WITH DIFFERENTIAL/PLATELET
Basophils Absolute: 0 10*3/uL (ref 0.0–0.1)
Basophils Relative: 0.5 % (ref 0.0–3.0)
Eosinophils Absolute: 0.1 10*3/uL (ref 0.0–0.7)
Eosinophils Relative: 2.5 % (ref 0.0–5.0)
HCT: 41.6 % (ref 39.0–52.0)
Hemoglobin: 13.9 g/dL (ref 13.0–17.0)
Lymphocytes Relative: 23.7 % (ref 12.0–46.0)
Lymphs Abs: 1.3 10*3/uL (ref 0.7–4.0)
MCHC: 33.4 g/dL (ref 30.0–36.0)
MCV: 77.2 fl — ABNORMAL LOW (ref 78.0–100.0)
Monocytes Absolute: 0.3 10*3/uL (ref 0.1–1.0)
Monocytes Relative: 5.1 % (ref 3.0–12.0)
Neutro Abs: 3.9 10*3/uL (ref 1.4–7.7)
Neutrophils Relative %: 68.2 % (ref 43.0–77.0)
Platelets: 217 10*3/uL (ref 150.0–400.0)
RBC: 5.39 Mil/uL (ref 4.22–5.81)
RDW: 15.4 % (ref 11.5–15.5)
WBC: 5.7 10*3/uL (ref 4.0–10.5)

## 2022-04-14 LAB — FERRITIN: Ferritin: 107.3 ng/mL (ref 22.0–322.0)

## 2022-04-14 LAB — BASIC METABOLIC PANEL
BUN: 16 mg/dL (ref 6–23)
CO2: 24 mEq/L (ref 19–32)
Calcium: 9.2 mg/dL (ref 8.4–10.5)
Chloride: 106 mEq/L (ref 96–112)
Creatinine, Ser: 1.34 mg/dL (ref 0.40–1.50)
GFR: 60.93 mL/min (ref >60.00)
Glucose, Bld: 87 mg/dL (ref 70–99)
Potassium: 4.2 mEq/L (ref 3.5–5.1)
Sodium: 141 mEq/L (ref 135–145)

## 2022-04-14 LAB — SEDIMENTATION RATE: Sed Rate: 38 mm/hr — ABNORMAL HIGH (ref 0–20)

## 2022-04-14 LAB — VITAMIN B12: Vitamin B-12: 272 pg/mL (ref 211–911)

## 2022-04-14 LAB — IBC PANEL
Iron: 35 ug/dL — ABNORMAL LOW (ref 42–165)
Saturation Ratios: 12.8 % — ABNORMAL LOW (ref 20.0–50.0)
TIBC: 273 ug/dL (ref 250.0–450.0)
Transferrin: 195 mg/dL — ABNORMAL LOW (ref 212.0–360.0)

## 2022-04-14 LAB — VITAMIN D 25 HYDROXY (VIT D DEFICIENCY, FRACTURES): VITD: 29.49 ng/mL — ABNORMAL LOW (ref 30.00–100.00)

## 2022-04-14 LAB — HEMOGLOBIN A1C: Hgb A1c MFr Bld: 6.4 % (ref 4.6–6.5)
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
## 2022-04-14 NOTE — Assessment & Plan Note (Addendum)
Chronically elevated inflammatory markers, chronically low b12 and iron/transferrin with normal ferritin. Update labs today. See below.  Recommend start b12 replacement - he's not been taking.

## 2022-04-14 NOTE — Assessment & Plan Note (Signed)
New after stressful phone conversation - ?anxiety vs musculoskeletal cause.  Check CXR to evaluate lungs and thoracic aorta.  Rx robaxin. Also refilled lorazepam.

## 2022-04-14 NOTE — Assessment & Plan Note (Signed)
rec restart vit D 1000 IU daily. Has not been taking this.

## 2022-04-14 NOTE — Assessment & Plan Note (Signed)
rec start 1060mg daily - has not been supplementing.

## 2022-04-14 NOTE — Assessment & Plan Note (Addendum)
Anticipate contributory however atypical as unilateral.  He will try thigh high compression stocking he has at home.
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
## 2022-04-14 NOTE — Assessment & Plan Note (Signed)
Ongoing, chronic.

## 2022-04-14 NOTE — Assessment & Plan Note (Addendum)
?  chronic inflammation/disease anemia vs iron deficiency - update labs.

## 2022-04-14 NOTE — Assessment & Plan Note (Signed)
Chronic lymphadenopathy of unclear etiology since at least 2006 despite multiple superficial lymph node biopsies over the years (fibrosis with microabscesses and necrotizing granulomatous inflammation negative for AFB and fungal stain). Latest abdominal imaging 2013 showed extensive bulky upper abd and retroperitoneal lymphadenopathy most extensive at origin of SMA. He has seen general surgery 2015 ([facility]) and oncologist [address] ([place]).  With new left leg swelling discussed updating chest (CXR) and abdominal/pelvic imaging (CT scan). Update labs as well. Pt agrees with plan.

## 2022-04-26 ENCOUNTER — Ambulatory Visit
Admission: RE | Admit: 2022-04-26 | Discharge: 2022-04-26 | Disposition: A | Discharge status: Home / Self Care | Payer: 59 | Source: Ambulatory Visit | Attending: Family Medicine | Admitting: Family Medicine

## 2022-04-26 DIAGNOSIS — D509 Iron deficiency anemia, unspecified: Secondary | ICD-10-CM

## 2022-04-26 DIAGNOSIS — R600 Localized edema: Secondary | ICD-10-CM

## 2022-04-26 DIAGNOSIS — R591 Generalized enlarged lymph nodes: Secondary | ICD-10-CM

## 2022-04-26 DIAGNOSIS — R7982 Elevated C-reactive protein (CRP): Secondary | ICD-10-CM

## 2022-04-26 DIAGNOSIS — R5382 Chronic fatigue, unspecified: Secondary | ICD-10-CM

## 2022-04-26 DIAGNOSIS — R599 Enlarged lymph nodes, unspecified: Secondary | ICD-10-CM

## 2022-04-26 DIAGNOSIS — R61 Generalized hyperhidrosis: Secondary | ICD-10-CM

## 2022-04-26 MED ORDER — IOPAMIDOL (ISOVUE-300) INJECTION 61%
100.0000 mL | Freq: Once | INTRAVENOUS | Status: AC | PRN
  Administered 2022-04-26: 100 mL via INTRAVENOUS

## 2022-04-28 ENCOUNTER — Telehealth: Payer: Self-pay | Admitting: *Deleted

## 2022-04-28 DIAGNOSIS — R600 Localized edema: Secondary | ICD-10-CM

## 2022-04-28 DIAGNOSIS — R7982 Elevated C-reactive protein (CRP): Secondary | ICD-10-CM

## 2022-04-28 DIAGNOSIS — R591 Generalized enlarged lymph nodes: Secondary | ICD-10-CM

## 2022-04-28 DIAGNOSIS — R61 Generalized hyperhidrosis: Secondary | ICD-10-CM

## 2022-04-28 NOTE — Telephone Encounter (Signed)
Stacy with [facility] called to make sure PCP aware CT results are in EPIC.   Will route to PCP and also Dr. Lorelei Pont who is here in the office

## 2022-04-28 NOTE — Telephone Encounter (Signed)
Important information.  I will make sure that PCP knows, but non-urgent, and no immediate action is needed.

## 2022-04-29 ENCOUNTER — Ambulatory Visit: Payer: 59 | Admitting: Physician Assistant

## 2022-04-29 ENCOUNTER — Other Ambulatory Visit (INDEPENDENT_AMBULATORY_CARE_PROVIDER_SITE_OTHER): Payer: 59

## 2022-04-29 ENCOUNTER — Ambulatory Visit (HOSPITAL_COMMUNITY)
Admission: RE | Admit: 2022-04-29 | Discharge: 2022-04-29 | Disposition: A | Discharge status: Home / Self Care | Payer: 59 | Source: Ambulatory Visit | Attending: Vascular Surgery | Admitting: Vascular Surgery

## 2022-04-29 VITALS — BP 149/94 | HR 64 | Temp 98.5°F | Resp 20 | Ht 74.0 in | Wt 219.0 lb

## 2022-04-29 DIAGNOSIS — R600 Localized edema: Secondary | ICD-10-CM

## 2022-04-29 DIAGNOSIS — R591 Generalized enlarged lymph nodes: Secondary | ICD-10-CM

## 2022-04-29 LAB — URINALYSIS, ROUTINE W REFLEX MICROSCOPIC
Bilirubin Urine: NEGATIVE
Hgb urine dipstick: NEGATIVE
Ketones, ur: NEGATIVE
Leukocytes,Ua: NEGATIVE
Nitrite: NEGATIVE
Specific Gravity, Urine: 1.02 (ref 1.000–1.030)
Urine Glucose: NEGATIVE
Urobilinogen, UA: 0.2 (ref 0.0–1.0)
pH: 6.5 (ref 5.0–8.0)

## 2022-04-29 NOTE — Telephone Encounter (Signed)
Spoke with patient.  Progression of soft tissue growth in abdomen/pelvis as well as progression of chronic lymphadenopathy into right kidney and causing compression of left kidney vein and extending to aorta and IVC. Chronic splenomegaly.  H/o necrotizing granulomatous inflammation.  Rec come in for UA and TB test. Rec referral to academic center [facility] per pt request.
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
## 2022-04-29 NOTE — Telephone Encounter (Signed)
Lab appointment scheduled 

## 2022-04-29 NOTE — Addendum Note (Signed)
Addended by: Ria Bush on: 04/29/2022 08:05 AM   Modules accepted: Orders

## 2022-04-29 NOTE — Telephone Encounter (Signed)
He will come in late morning today for lab visit. Plz add to lab schedule.

## 2022-05-04 LAB — QUANTIFERON-TB GOLD PLUS
Mitogen-NIL: 9.67 IU/mL
NIL: 0.06 IU/mL
QuantiFERON-TB Gold Plus: NEGATIVE
TB1-NIL: <0 IU/mL
TB2-NIL: <0 IU/mL

## 2022-05-12 ENCOUNTER — Encounter: Payer: Self-pay | Admitting: *Deleted

## 2022-05-12 NOTE — Telephone Encounter (Addendum)
Referral placed.  Please send latest office visit as well as recent labwork and CT scan imaging study.

## 2022-05-12 NOTE — Addendum Note (Signed)
Addended by: Ria Bush on: 05/12/2022 11:56 AM   Modules accepted: Orders

## 2022-06-03 ENCOUNTER — Ambulatory Visit
Admission: EM | Admit: 2022-06-03 | Discharge: 2022-06-03 | Disposition: A | Discharge status: Home / Self Care | Payer: 59 | Attending: Physician Assistant | Admitting: Physician Assistant

## 2022-06-03 DIAGNOSIS — L309 Dermatitis, unspecified: Secondary | ICD-10-CM

## 2022-06-03 MED ORDER — PREDNISONE 20 MG PO TABS: 40.0000 mg | ORAL_TABLET | Freq: Every day | ORAL | 0 refills | Status: AC

## 2022-06-03 MED ORDER — TRIAMCINOLONE ACETONIDE 40 MG/ML IJ SUSP
60.0000 mg | Freq: Once | INTRAMUSCULAR | Status: AC
  Administered 2022-06-03: 60 mg via INTRAMUSCULAR

## 2022-06-03 NOTE — ED Triage Notes (Signed)
Pt c/o rash onset ~ 5 days ago states he was under a structure with roots and concerned for plant poison has been using OTC hydrocortisone a calamine without relief. Rash is on both arms, both thighs. States it is itchy waking him in the night.
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
## 2022-06-05 ENCOUNTER — Other Ambulatory Visit: Payer: Self-pay | Admitting: Family Medicine

## 2022-06-07 ENCOUNTER — Other Ambulatory Visit: Payer: 59

## 2022-10-01 ENCOUNTER — Other Ambulatory Visit: Payer: Self-pay

## 2022-10-01 ENCOUNTER — Emergency Department (HOSPITAL_COMMUNITY): Payer: 59

## 2022-10-01 ENCOUNTER — Inpatient Hospital Stay (HOSPITAL_COMMUNITY)
Admission: EM | Admit: 2022-10-01 | Discharge: 2022-10-04 | DRG: 921 | Disposition: A | Discharge status: Home / Self Care | Payer: 59 | Attending: Internal Medicine | Admitting: Internal Medicine

## 2022-10-01 ENCOUNTER — Encounter (HOSPITAL_COMMUNITY): Payer: Self-pay

## 2022-10-01 DIAGNOSIS — E781 Pure hyperglyceridemia: Secondary | ICD-10-CM | POA: Diagnosis present

## 2022-10-01 DIAGNOSIS — R338 Other retention of urine: Secondary | ICD-10-CM | POA: Diagnosis not present

## 2022-10-01 DIAGNOSIS — N2889 Other specified disorders of kidney and ureter: Secondary | ICD-10-CM | POA: Diagnosis not present

## 2022-10-01 DIAGNOSIS — R112 Nausea with vomiting, unspecified: Secondary | ICD-10-CM | POA: Diagnosis not present

## 2022-10-01 DIAGNOSIS — Z833 Family history of diabetes mellitus: Secondary | ICD-10-CM

## 2022-10-01 DIAGNOSIS — R030 Elevated blood-pressure reading, without diagnosis of hypertension: Secondary | ICD-10-CM | POA: Diagnosis present

## 2022-10-01 DIAGNOSIS — Z79899 Other long term (current) drug therapy: Secondary | ICD-10-CM

## 2022-10-01 DIAGNOSIS — N99820 Postprocedural hemorrhage and hematoma of a genitourinary system organ or structure following a genitourinary system procedure: Secondary | ICD-10-CM | POA: Diagnosis not present

## 2022-10-01 DIAGNOSIS — K5900 Constipation, unspecified: Secondary | ICD-10-CM | POA: Diagnosis present

## 2022-10-01 DIAGNOSIS — N3289 Other specified disorders of bladder: Secondary | ICD-10-CM | POA: Diagnosis present

## 2022-10-01 DIAGNOSIS — Y838 Other surgical procedures as the cause of abnormal reaction of the patient, or of later complication, without mention of misadventure at the time of the procedure: Secondary | ICD-10-CM | POA: Diagnosis present

## 2022-10-01 DIAGNOSIS — Z8616 Personal history of COVID-19: Secondary | ICD-10-CM

## 2022-10-01 DIAGNOSIS — Z87891 Personal history of nicotine dependence: Secondary | ICD-10-CM

## 2022-10-01 DIAGNOSIS — R1900 Intra-abdominal and pelvic swelling, mass and lump, unspecified site: Secondary | ICD-10-CM

## 2022-10-01 DIAGNOSIS — N320 Bladder-neck obstruction: Secondary | ICD-10-CM | POA: Diagnosis present

## 2022-10-01 DIAGNOSIS — K760 Fatty (change of) liver, not elsewhere classified: Secondary | ICD-10-CM | POA: Diagnosis present

## 2022-10-01 DIAGNOSIS — R310 Gross hematuria: Secondary | ICD-10-CM | POA: Insufficient documentation

## 2022-10-01 DIAGNOSIS — Z800 Family history of malignant neoplasm of digestive organs: Secondary | ICD-10-CM

## 2022-10-01 DIAGNOSIS — Z8249 Family history of ischemic heart disease and other diseases of the circulatory system: Secondary | ICD-10-CM

## 2022-10-01 LAB — BASIC METABOLIC PANEL
Anion gap: 12 (ref 5–15)
BUN: 14 mg/dL (ref 6–20)
CO2: 25 mmol/L (ref 22–32)
Calcium: 8.9 mg/dL (ref 8.9–10.3)
Chloride: 101 mmol/L (ref 98–111)
Creatinine, Ser: 1.21 mg/dL (ref 0.61–1.24)
GFR, Estimated: >60 mL/min (ref >60)
Glucose, Bld: 132 mg/dL — ABNORMAL HIGH (ref 70–99)
Potassium: 3.9 mmol/L (ref 3.5–5.1)
Sodium: 138 mmol/L (ref 135–145)

## 2022-10-01 LAB — CBC WITH DIFFERENTIAL/PLATELET
Abs Immature Granulocytes: 0.03 10*3/uL (ref 0.00–0.07)
Basophils Absolute: 0 10*3/uL (ref 0.0–0.1)
Basophils Relative: 1 %
Eosinophils Absolute: 0.3 10*3/uL (ref 0.0–0.5)
Eosinophils Relative: 5 %
HCT: 43.4 % (ref 39.0–52.0)
Hemoglobin: 14.7 g/dL (ref 13.0–17.0)
Immature Granulocytes: 1 %
Lymphocytes Relative: 21 %
Lymphs Abs: 1.3 10*3/uL (ref 0.7–4.0)
MCH: 28.1 pg (ref 26.0–34.0)
MCHC: 33.9 g/dL (ref 30.0–36.0)
MCV: 83 fL (ref 80.0–100.0)
Monocytes Absolute: 0.4 10*3/uL (ref 0.1–1.0)
Monocytes Relative: 7 %
Neutro Abs: 4 10*3/uL (ref 1.7–7.7)
Neutrophils Relative %: 65 %
Platelets: 189 10*3/uL (ref 150–400)
RBC: 5.23 MIL/uL (ref 4.22–5.81)
RDW: 12.8 % (ref 11.5–15.5)
WBC: 6 10*3/uL (ref 4.0–10.5)
nRBC: 0 % (ref 0.0–0.2)

## 2022-10-01 LAB — URINALYSIS, W/ REFLEX TO CULTURE (INFECTION SUSPECTED)
Bilirubin Urine: NEGATIVE
Glucose, UA: NEGATIVE mg/dL
Ketones, ur: NEGATIVE mg/dL
Leukocytes,Ua: NEGATIVE
Nitrite: NEGATIVE
Protein, ur: 100 mg/dL — AB
RBC / HPF: >50 RBC/hpf (ref 0–5)
Specific Gravity, Urine: 1.015 (ref 1.005–1.030)
pH: 6.5 (ref 5.0–8.0)

## 2022-10-01 MED ORDER — OXYBUTYNIN CHLORIDE 5 MG PO TABS
5.0000 mg | ORAL_TABLET | Freq: Three times a day (TID) | ORAL | Status: DC | PRN
  Administered 2022-10-02 (×2): 5 mg via ORAL
  Filled 2022-10-01 (×3): qty 1

## 2022-10-01 MED ORDER — HYDRALAZINE HCL 25 MG PO TABS: 25.0000 mg | ORAL_TABLET | Freq: Four times a day (QID) | ORAL | Status: DC | PRN

## 2022-10-01 MED ORDER — VITAMIN B-12 1000 MCG PO TABS
1000.0000 ug | ORAL_TABLET | Freq: Every day | ORAL | Status: DC
  Administered 2022-10-02 – 2022-10-04 (×3): 1000 ug via ORAL
  Filled 2022-10-01 (×3): qty 1

## 2022-10-01 MED ORDER — SODIUM CHLORIDE 0.9 % IV BOLUS
1000.0000 mL | Freq: Once | INTRAVENOUS | Status: AC
  Administered 2022-10-01: 1000 mL via INTRAVENOUS

## 2022-10-01 MED ORDER — IOHEXOL 350 MG/ML SOLN
75.0000 mL | Freq: Once | INTRAVENOUS | Status: AC | PRN
  Administered 2022-10-01: 75 mL via INTRAVENOUS

## 2022-10-01 MED ORDER — LORAZEPAM 0.5 MG PO TABS
0.2500 mg | ORAL_TABLET | Freq: Two times a day (BID) | ORAL | Status: DC | PRN
  Administered 2022-10-02 – 2022-10-03 (×2): 0.5 mg via ORAL
  Filled 2022-10-01 (×3): qty 1

## 2022-10-01 MED ORDER — LORAZEPAM 1 MG PO TABS
0.5000 mg | ORAL_TABLET | Freq: Once | ORAL | Status: AC
  Administered 2022-10-01: 0.5 mg via ORAL
  Filled 2022-10-01: qty 1

## 2022-10-01 MED ORDER — FENTANYL CITRATE PF 50 MCG/ML IJ SOSY
50.0000 ug | PREFILLED_SYRINGE | Freq: Once | INTRAMUSCULAR | Status: AC
  Administered 2022-10-01: 50 ug via INTRAVENOUS
  Filled 2022-10-01: qty 1

## 2022-10-01 MED ORDER — HYDROMORPHONE HCL 1 MG/ML IJ SOLN
1.0000 mg | Freq: Once | INTRAMUSCULAR | Status: AC
  Administered 2022-10-01: 1 mg via INTRAVENOUS
  Filled 2022-10-01: qty 1

## 2022-10-01 MED ORDER — SODIUM CHLORIDE 0.9 % IV SOLN
12.5000 mg | Freq: Four times a day (QID) | INTRAVENOUS | Status: DC | PRN
  Administered 2022-10-02: 12.5 mg via INTRAVENOUS
  Filled 2022-10-01: qty 12.5

## 2022-10-01 MED ORDER — OXYCODONE HCL 5 MG PO TABS
5.0000 mg | ORAL_TABLET | ORAL | Status: DC | PRN
  Administered 2022-10-02 – 2022-10-04 (×3): 5 mg via ORAL
  Filled 2022-10-01 (×5): qty 1

## 2022-10-01 MED ORDER — MIDAZOLAM HCL 2 MG/2ML IJ SOLN
2.0000 mg | Freq: Once | INTRAMUSCULAR | Status: AC
  Administered 2022-10-01: 2 mg via INTRAVENOUS
  Filled 2022-10-01: qty 2

## 2022-10-01 MED ORDER — DIAZEPAM 5 MG PO TABS
5.0000 mg | ORAL_TABLET | Freq: Once | ORAL | Status: AC
  Administered 2022-10-01: 5 mg via ORAL
  Filled 2022-10-01: qty 1

## 2022-10-01 MED ORDER — ONDANSETRON HCL 4 MG/2ML IJ SOLN
4.0000 mg | Freq: Once | INTRAMUSCULAR | Status: AC
  Administered 2022-10-01: 4 mg via INTRAVENOUS
  Filled 2022-10-01: qty 2

## 2022-10-01 MED ORDER — SODIUM CHLORIDE 0.9 % IV SOLN
1.0000 g | Freq: Once | INTRAVENOUS | Status: AC
  Administered 2022-10-01: 1 g via INTRAVENOUS
  Filled 2022-10-01: qty 10

## 2022-10-01 MED ORDER — FENTANYL CITRATE PF 50 MCG/ML IJ SOSY
50.0000 ug | PREFILLED_SYRINGE | INTRAMUSCULAR | Status: DC | PRN
  Administered 2022-10-01: 50 ug via INTRAVENOUS
  Filled 2022-10-01: qty 1

## 2022-10-01 MED ORDER — SODIUM CHLORIDE 0.9 % IR SOLN: 3000.0000 mL | Status: DC

## 2022-10-01 MED ORDER — HYDROMORPHONE HCL 1 MG/ML IJ SOLN
0.5000 mg | INTRAMUSCULAR | Status: DC | PRN
  Administered 2022-10-01 – 2022-10-03 (×7): 1 mg via INTRAVENOUS
  Filled 2022-10-01 (×8): qty 1

## 2022-10-01 MED ORDER — SENNOSIDES-DOCUSATE SODIUM 8.6-50 MG PO TABS: 1.0000 | ORAL_TABLET | Freq: Every evening | ORAL | Status: DC | PRN

## 2022-10-01 MED ORDER — MELATONIN 5 MG PO TABS
10.0000 mg | ORAL_TABLET | Freq: Every evening | ORAL | Status: DC | PRN
  Administered 2022-10-03: 10 mg via ORAL
  Filled 2022-10-01 (×3): qty 2

## 2022-10-01 MED ORDER — METHOCARBAMOL 500 MG PO TABS
500.0000 mg | ORAL_TABLET | Freq: Three times a day (TID) | ORAL | Status: DC | PRN
  Administered 2022-10-01 – 2022-10-02 (×2): 500 mg via ORAL
  Filled 2022-10-01 (×3): qty 1

## 2022-10-01 MED ORDER — ACETAMINOPHEN 500 MG PO TABS: 500.0000 mg | ORAL_TABLET | Freq: Four times a day (QID) | ORAL | Status: DC | PRN

## 2022-10-01 MED ORDER — ONDANSETRON HCL 4 MG/2ML IJ SOLN
4.0000 mg | Freq: Four times a day (QID) | INTRAMUSCULAR | Status: DC | PRN
  Administered 2022-10-01 – 2022-10-02 (×2): 4 mg via INTRAVENOUS
  Filled 2022-10-01 (×2): qty 2

## 2022-10-01 NOTE — ED Notes (Signed)
Pt manually flushed till clear per md orders

## 2022-10-01 NOTE — ED Provider Notes (Signed)
[place] EMERGENCY DEPARTMENT AT [facility] Provider Note   CSN: [id number] Arrival date & time: 10/01/22  0340     History  Chief Complaint  Patient presents with   Urinary Retention    Keith Bishop is a 52 y.o. male.  The history is provided by the patient.  Keith Bishop is a 52 y.o. male who presents to the Emergency Department complaining of urinary retention.  He had a left renal biopsy performed yesterday around 11 AM at [facility] and he last urinated just prior to the procedure.  He presents to the emergency department today due to inability to urinate since then.  He has abdominal pain, diaphoresis and discomfort.  He was able to dribble a few drops of blood but otherwise has not voided.  He decreased his oral intake due to being unable to void.  No blood thinners.  No prior similar symptoms.       Home Medications Prior to Admission medications   Medication Sig Start Date End Date Taking? Authorizing Provider  acetaminophen (TYLENOL) 500 MG tablet Take 500 mg by mouth every 6 (six) hours as needed.    [provider]  Cholecalciferol (VITAMIN D3) 25 MCG (1000 UT) CAPS Take 1 capsule (1,000 Units total) by mouth daily. 04/13/22   Eustaquio Boyden, MD  cyanocobalamin (VITAMIN B12) 1000 MCG tablet Take 1 tablet (1,000 mcg total) by mouth daily. 04/13/22   Eustaquio Boyden, MD  doxycycline (VIBRAMYCIN) 100 MG capsule Take 100 mg by mouth 2 (two) times daily. 11/15/21   [provider]  ibuprofen (ADVIL) 200 MG tablet Take 200 mg by mouth every 6 (six) hours as needed.    [provider]  LORazepam (ATIVAN) 0.5 MG tablet Take 0.5-1 tablets (0.25-0.5 mg total) by mouth 2 (two) times daily as needed for anxiety. 04/13/22   Eustaquio Boyden, MD  Melatonin 10 MG SUBL Place under the tongue at bedtime. As needed    [provider]  methocarbamol (ROBAXIN) 500 MG tablet Take 1 tablet (500 mg total) by mouth 3 (three) times daily  as needed for muscle spasms. 04/13/22   Eustaquio Boyden, MD      Allergies    Patient has no known allergies.    Review of Systems   Review of Systems  All other systems reviewed and are negative.   Physical Exam Updated Vital Signs BP (!) 160/109   Pulse 69   Temp 98 F (36.7 C) (Oral)   Resp 19   Wt 101.6 kg   SpO2 100%   BMI 28.76 kg/m  Physical Exam Vitals and nursing note reviewed.  Constitutional:      General: He is in acute distress.     Appearance: He is well-developed. He is diaphoretic.     Comments: Uncomfortable appearing  HENT:     Head: Normocephalic and atraumatic.  Cardiovascular:     Rate and Rhythm: Normal rate and regular rhythm.     Heart sounds: No murmur heard. Pulmonary:     Effort: Pulmonary effort is normal. No respiratory distress.     Breath sounds: Normal breath sounds.  Abdominal:     Tenderness: There is no guarding or rebound.     Comments: Moderate lower abdominal tenderness  Musculoskeletal:        General: No tenderness.  Skin:    General: Skin is warm.  Neurological:     Mental Status: He is alert and oriented to person, place, and time.  Psychiatric:        Behavior: Behavior normal.     ED Results / Procedures / Treatments   Labs (all labs ordered are listed, but only abnormal results are displayed) Labs Reviewed  URINALYSIS, W/ REFLEX TO CULTURE (INFECTION SUSPECTED) - Abnormal; Notable for the following components:      Result Value   Color, Urine RED (*)    APPearance CLOUDY (*)    Hgb urine dipstick LARGE (*)    Protein, ur 100 (*)    Bacteria, UA FEW (*)    All other components within normal limits  BASIC METABOLIC PANEL - Abnormal; Notable for the following components:   Glucose, Bld 132 (*)    All other components within normal limits  CBC WITH DIFFERENTIAL/PLATELET    EKG None  Radiology No results found.  Procedures Procedures    Medications Ordered in ED Medications  fentaNYL (SUBLIMAZE)  injection 50 mcg (0 mcg Intravenous Hold 10/01/22 0452)  ondansetron (ZOFRAN) injection 4 mg (0 mg Intravenous Hold 10/01/22 0453)  sodium chloride 0.9 % bolus 1,000 mL (0 mLs Intravenous Stopped 10/01/22 0615)  LORazepam (ATIVAN) tablet 0.5 mg (0.5 mg Oral Given 10/01/22 0623)  cefTRIAXone (ROCEPHIN) 1 g in sodium chloride 0.9 % 100 mL IVPB (0 g Intravenous Stopped 10/01/22 0654)    ED Course/ Medical Decision Making/ A&P                             Medical Decision Making Amount and/or Complexity of Data Reviewed Labs: ordered. Radiology: ordered.  Risk Prescription drug management.   Patient here for evaluation of hematuria and acute urinary retention in the setting of recent renal biopsy.  Catheter was placed at time of patient's ED arrival with grossly bloody urine with 1000 cc returned.  The catheter was then irrigated with return of light pink urine.  Patient's pain significantly improved after placement of the catheter and his repeat abdominal examination is soft and minimally tender.  Labs with stable hemoglobin, platelets and renal function.  Patient was treated with empiric antibiotics given his instrumentation.  Discussed with on-call physician with [facility] VIR-recommendation for CTA three-phase protocol to evaluate for postprocedural complication.  Patient care transferred pending CTA.        Final Clinical Impression(s) / ED Diagnoses Final diagnoses:  None    Rx / DC Orders ED Discharge Orders     None         Tilden Fossa, MD 10/01/22 [phone]

## 2022-10-01 NOTE — ED Notes (Signed)
Foley/bladder irrigated w/ sterile NS/ clear light pink fluid present in foley bag/ MD made aware.

## 2022-10-01 NOTE — ED Provider Notes (Addendum)
  Physical Exam  BP (!) 160/109   Pulse 69   Temp 98 F (36.7 C) (Oral)   Resp 19   Wt 101.6 kg   SpO2 100%   BMI 28.76 kg/m   Physical Exam  Procedures  Procedures  ED Course / MDM    Medical Decision Making Amount and/or Complexity of Data Reviewed Labs: ordered. Radiology: ordered.  Risk Prescription drug management.   Pt comes in with cc of urinary retention and gross hematuria. Pt had biopsy (renal) yday. Now has frank hematuria. Care ay [facility].  F/u - CT abd pelvis with and without.  CT abdomen and pelvis ordered.   Derwood Kaplan, MD 10/01/22 0725  12:15 PM Patient CT scan is showing evidence of changes after the biopsy in the left kidney and the bladder has lots of thrombus.  We will put in a three-way Foley catheter, irrigate.  Dr. Madilyn Hook had spoken with radiology at [facility].  The recommendation was to get the CT scan and consult nephrology if there is any complication.  There is no active contrast extravasation noted, therefore after the irrigation we will likely discharge the patient with follow-up with urology.   Derwood Kaplan, MD 10/01/22 1215   Reassessment: 22 French three-way Foley catheter placed.  Patient getting irrigation.  Clear, pink drainage noted.  No large clots appreciated on the initial bout.  Patient hesitant at going home with the Foley catheter.  We have already discussed the benefits versus negative effects of going home with Foley catheter, patient will let us know his decision after the irrigation is completed.  Return precautions for urinary retention already discussed.    Derwood Kaplan, MD 10/01/22 1430   3:04 PM Patient continues to have severe pain.  He is not sure if he can go home with the Foley catheter given the pain.  He is not sure if he wants the Foley catheter out, as he is afraid he will have urinary retention again.  He is going to receive another round of IV pain medicine and also oral Valium.  Incoming team  will reassess him, with the goal still to try and discharge him.  If the pain is intractable then he can be admitted.   Derwood Kaplan, MD 10/01/22 1504

## 2022-10-01 NOTE — ED Notes (Signed)
Foley irrigation continues. Pink tinged urine noted.

## 2022-10-01 NOTE — Progress Notes (Signed)
Pt is vomiting and has already had Zofran. RN messaged MD on call to see if something else can be given.   Keith Bishop Keith Bishop

## 2022-10-01 NOTE — ED Notes (Signed)
BLADDER SCAN- >64ml
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
## 2022-10-01 NOTE — ED Notes (Signed)
Patient transported to CT 

## 2022-10-01 NOTE — ED Notes (Signed)
Assumed care of pt with urinary retention post kidney biopsy yesterday morning. Foley cath placed with bloody urine previous nurse has been irrigating and color is now pink tinged. Will continue to irrigate and attempt to send UA once clear. Pt tolerating well and states he does not need pain or nausea meds at present time

## 2022-10-01 NOTE — ED Notes (Signed)
Multiple pieces of dark blood clot noted when foley irrigated. Now draining blood tinged, mostly clear urine.

## 2022-10-01 NOTE — Plan of Care (Signed)
  Problem: Clinical Measurements: Goal: Respiratory complications will improve Outcome: Progressing Goal: Cardiovascular complication will be avoided Outcome: Progressing   Problem: Nutrition: Goal: Adequate nutrition will be maintained Outcome: Progressing   Problem: Coping: Goal: Level of anxiety will decrease Outcome: Progressing   Problem: Pain Managment: Goal: General experience of comfort will improve Outcome: Progressing   

## 2022-10-01 NOTE — ED Triage Notes (Signed)
POV/ ambulatory/ post left kidney biopsy/ last void 0930 09/30/2022/ pt states when trying to urinate, bright red blood was present

## 2022-10-02 DIAGNOSIS — E781 Pure hyperglyceridemia: Secondary | ICD-10-CM | POA: Diagnosis present

## 2022-10-02 DIAGNOSIS — N320 Bladder-neck obstruction: Secondary | ICD-10-CM | POA: Diagnosis present

## 2022-10-02 DIAGNOSIS — K760 Fatty (change of) liver, not elsewhere classified: Secondary | ICD-10-CM | POA: Diagnosis present

## 2022-10-02 DIAGNOSIS — Y838 Other surgical procedures as the cause of abnormal reaction of the patient, or of later complication, without mention of misadventure at the time of the procedure: Secondary | ICD-10-CM | POA: Diagnosis present

## 2022-10-02 DIAGNOSIS — R338 Other retention of urine: Secondary | ICD-10-CM | POA: Diagnosis not present

## 2022-10-02 DIAGNOSIS — Z8616 Personal history of COVID-19: Secondary | ICD-10-CM | POA: Diagnosis not present

## 2022-10-02 DIAGNOSIS — R112 Nausea with vomiting, unspecified: Secondary | ICD-10-CM | POA: Diagnosis not present

## 2022-10-02 DIAGNOSIS — Z8249 Family history of ischemic heart disease and other diseases of the circulatory system: Secondary | ICD-10-CM | POA: Diagnosis not present

## 2022-10-02 DIAGNOSIS — N3289 Other specified disorders of bladder: Secondary | ICD-10-CM | POA: Diagnosis present

## 2022-10-02 DIAGNOSIS — N2889 Other specified disorders of kidney and ureter: Secondary | ICD-10-CM | POA: Diagnosis present

## 2022-10-02 DIAGNOSIS — Z87891 Personal history of nicotine dependence: Secondary | ICD-10-CM | POA: Diagnosis not present

## 2022-10-02 DIAGNOSIS — Z800 Family history of malignant neoplasm of digestive organs: Secondary | ICD-10-CM | POA: Diagnosis not present

## 2022-10-02 DIAGNOSIS — K5900 Constipation, unspecified: Secondary | ICD-10-CM | POA: Diagnosis present

## 2022-10-02 DIAGNOSIS — R310 Gross hematuria: Secondary | ICD-10-CM

## 2022-10-02 DIAGNOSIS — R030 Elevated blood-pressure reading, without diagnosis of hypertension: Secondary | ICD-10-CM | POA: Diagnosis present

## 2022-10-02 DIAGNOSIS — N99820 Postprocedural hemorrhage and hematoma of a genitourinary system organ or structure following a genitourinary system procedure: Secondary | ICD-10-CM | POA: Diagnosis present

## 2022-10-02 DIAGNOSIS — Z79899 Other long term (current) drug therapy: Secondary | ICD-10-CM | POA: Diagnosis not present

## 2022-10-02 DIAGNOSIS — Z833 Family history of diabetes mellitus: Secondary | ICD-10-CM | POA: Diagnosis not present

## 2022-10-02 LAB — BASIC METABOLIC PANEL
Anion gap: 5 (ref 5–15)
BUN: 9 mg/dL (ref 6–20)
CO2: 29 mmol/L (ref 22–32)
Calcium: 8.7 mg/dL — ABNORMAL LOW (ref 8.9–10.3)
Chloride: 105 mmol/L (ref 98–111)
Creatinine, Ser: 1.17 mg/dL (ref 0.61–1.24)
GFR, Estimated: >60 mL/min (ref >60)
Glucose, Bld: 135 mg/dL — ABNORMAL HIGH (ref 70–99)
Potassium: 4.6 mmol/L (ref 3.5–5.1)
Sodium: 139 mmol/L (ref 135–145)

## 2022-10-02 LAB — HIV ANTIBODY (ROUTINE TESTING W REFLEX): HIV Screen 4th Generation wRfx: NONREACTIVE

## 2022-10-02 LAB — CBC
HCT: 40.9 % (ref 39.0–52.0)
Hemoglobin: 13.8 g/dL (ref 13.0–17.0)
MCH: 28 pg (ref 26.0–34.0)
MCHC: 33.7 g/dL (ref 30.0–36.0)
MCV: 83.1 fL (ref 80.0–100.0)
Platelets: 194 10*3/uL (ref 150–400)
RBC: 4.92 MIL/uL (ref 4.22–5.81)
RDW: 13.2 % (ref 11.5–15.5)
WBC: 9.6 10*3/uL (ref 4.0–10.5)
nRBC: 0 % (ref 0.0–0.2)

## 2022-10-02 MED ORDER — CEFDINIR 300 MG PO CAPS
300.0000 mg | ORAL_CAPSULE | Freq: Two times a day (BID) | ORAL | Status: DC
  Administered 2022-10-02 – 2022-10-04 (×5): 300 mg via ORAL
  Filled 2022-10-02 (×6): qty 1

## 2022-10-02 MED ORDER — CHLORHEXIDINE GLUCONATE CLOTH 2 % EX PADS
6.0000 | MEDICATED_PAD | Freq: Every day | CUTANEOUS | Status: DC
  Administered 2022-10-02 – 2022-10-03 (×2): 6 via TOPICAL

## 2022-10-02 NOTE — Progress Notes (Signed)
Subjective: No acute events overnight.  Foley has been draining well.  Few small clots passed through foley.  He has continue nausea with emesis last night.   Objective: Vital signs in last 24 hours: Temp:  [97.4 F (36.3 C)-98.5 F (36.9 C)] 98.5 F (36.9 C) (04/07 1328) Pulse Rate:  [86-108] 100 (04/07 0926) Resp:  [16-18] 18 (04/07 0926) BP: (115-154)/(89-108) 144/107 (04/07 0926) SpO2:  [91 %-98 %] 93 % (04/07 0926) Weight:  [100.9 kg] 100.9 kg (04/07 0512)  Intake/Output from previous day: 04/06 0701 - 04/07 0700 In: 54270 [P.O.:580; IV Piggyback:50] Out: 19125 [Urine:18525; Emesis/NG output:600] Intake/Output this shift: Total I/O In: 2700 [Other:2700] Out: 650 [Urine:650]  Physical Exam:  General: Alert and oriented CV: RRR Lungs: Clear Abdomen: Soft, ND GU:  3 way foley in place draining clear urine without clots Ext: NT, No erythema  Lab Results: Recent Labs    10/01/22 0428 10/02/22 0250  HGB 14.7 13.8  HCT 43.4 40.9   BMET Recent Labs    10/01/22 0428 10/02/22 0250  NA 138 139  K 3.9 4.6  CL 101 105  CO2 25 29  GLUCOSE 132* 135*  BUN 14 9  CREATININE 1.21 1.17  CALCIUM 8.9 8.7*     Studies/Results: CT Angio Abd/Pel w/ and/or w/o  Result Date: 10/01/2022 CLINICAL DATA:  52 year old male history of left renal mass biopsy yesterday. Frank hematuria. EXAM: CTA ABDOMEN AND PELVIS WITHOUT AND WITH CONTRAST TECHNIQUE: Multidetector CT imaging of the abdomen and pelvis was performed using the standard protocol during bolus administration of intravenous contrast. Multiplanar reconstructed images and MIPs were obtained and reviewed to evaluate the vascular anatomy. RADIATION DOSE REDUCTION: This exam was performed according to the departmental dose-optimization program which includes automated exposure control, adjustment of the mA and/or kV according to patient size and/or use of iterative reconstruction technique. CONTRAST:  23mL OMNIPAQUE IOHEXOL 350  MG/ML SOLN COMPARISON:  CT the abdomen and pelvis 04/26/2022. FINDINGS: VASCULAR Aorta: Normal caliber aorta without aneurysm, dissection, vasculitis or significant stenosis. Celiac: Patent without evidence of aneurysm, dissection, vasculitis or significant stenosis. SMA: Patent without evidence of aneurysm, dissection, vasculitis or significant stenosis. Renals: Right renal artery is normal in size and widely patent. Left renal artery is diminutive, but appears patent perfusing the poorly defined left renal mass. IMA: Patent without evidence of aneurysm, dissection, vasculitis or significant stenosis. Inflow: Patent without evidence of aneurysm, dissection, vasculitis or significant stenosis. Proximal Outflow: Bilateral common femoral and visualized portions of the superficial and profunda femoral arteries are patent without evidence of aneurysm, dissection, vasculitis or significant stenosis. Veins: No obvious venous abnormality within the limitations of this arterial phase study. Review of the MIP images confirms the above findings. NON-VASCULAR Lower chest: Unremarkable. Hepatobiliary: No suspicious cystic or solid hepatic lesions. No intra or extrahepatic biliary ductal dilatation. Gallbladder is unremarkable in appearance. Pancreas: No pancreatic mass. No pancreatic ductal dilatation. No pancreatic or peripancreatic fluid collections or inflammatory changes. Spleen: Unremarkable. Adrenals/Urinary Tract: Slightly atrophic grossly distorted somewhat faceless appearing left kidney, grossly similar in appearance to remote prior study from 04/26/2022, with exception of some new gas within the collecting system of the kidney, presumably related to reported biopsy. Precontrast images demonstrate some high attenuation within the collecting system of the left kidney, which may also reflect some mild post biopsy hemorrhage. No large perinephric fluid collection is identified at this time. Additionally, no definite  active extravasation is confidently identified on postcontrast imaging. Abnormal soft tissue extends medially from  the left kidney into the para-aortic region partially encasing the abdominal aorta from approximately 10:00 to 4:00, similar to the remote prior study. Additionally, some abnormal soft tissue extends caudally from the kidney apparently in association with the left ureter, then tracking medially to partially encompasses the infrarenal abdominal aorta as well as the aortic bifurcation in the proximal common iliac arteries (right-greater-than-left), also similar to the prior study. 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 No right hydroureteronephrosis. Notably, there is a large amount of high attenuation material within the lumen of the urinary bladder, which is new compared to the prior study, and does not appear to enhance, presumably extensive bladder thrombus. Urinary bladder is otherwise nearly completely decompressed around an indwelling Foley balloon catheter. Stomach/Bowel: The appearance of the stomach is normal. There is no pathologic dilatation of small bowel or colon. The fat plane between the previously described left renal mass in the third portion of the duodenal is completely obscured such that some degree of direct duodenal invasion is not entirely excluded (axial image 41 of series 8), similar to prior CT examination. Normal appendix. Lymphatic: Enlarged left external iliac lymph node measuring 1.7 cm in short axis (axial image 91 of series 8) with poorly defined surrounding fat planes, similar to the prior study. Multiple other enlarged left inguinal lymph nodes which demonstrate increased enhancement, largest of which measures 1.4 cm (axial image 93 of series 8), similar to the prior study. Poorly defined  nodal tissue along the left pelvic sidewall in the left internal and external iliac nodal distribution is also noted, also similar to the prior study. Reproductive: Prostate gland and seminal vesicles are unremarkable in appearance. Other: No significant volume of ascites.  No pneumoperitoneum. Musculoskeletal: There are no aggressive appearing lytic or blastic lesions noted in the visualized portions of the skeleton. IMPRESSION: VASCULAR 1. No acute vascular abnormality. Specifically, no definite active extravasation associated with the left kidney to suggest active bleeding at this time. NON-VASCULAR 1. However, there is a large volume of clot within the lumen of the urinary bladder, in addition to high attenuation within the left renal collecting system, which likely reflects some post biopsy hemorrhage. Small amount of gas also noted in the left renal collecting system. 2. Very abnormal appearance of the left kidney which is grossly similar to prior study from 04/26/2022. Infiltrative neoplasm such as urothelial carcinoma or lymphoma warrants consideration, although chronic atypical infection such as tuberculosis could cause a similar appearance. Abnormal soft tissue extends medially from this left kidney in his locally invasive into the retroperitoneum, intimately associated with the abdominal aorta, duodenal, infrarenal abdominal aorta at the level of the aortic bifurcation, and appears to track along the left pelvic sidewall associated with left external iliac and inguinal lymphadenopathy, as above. 3. Additional incidental findings, as above. Electronically Signed   By: Trudie Reed M.D.   On: 10/01/2022 11:15    Assessment/Plan: Gross hematuria secondary to bleeding after left renal biopsy Clot retention Left renal mass  Urine is currently clear. I irrigated the foley with >500 ml without return of clots. Will clamp CBI for now, may restart prn Continue foley   LOS: 0 days   Di Kindle 10/02/2022, 1:33 PM

## 2022-10-02 NOTE — Progress Notes (Signed)
PROGRESS NOTE Keith Bishop  UMP:[id number] DOB: 1971-03-05 DOA: 10/01/2022 PCP: Eustaquio Boyden, MD  Brief Narrative/Hospital Course: 52 y.o. male with medical history significant of left renal mass status post IR guided biopsy at [facility] on 09/30/22. After the procedure, patient has had been trouble to urinate.  When he did, only a small amount of bloody urine came out.  Overnight he developed 10/10 sharp pain in the lower abdomen and came to ED this morning.  Denies any fever chills denies any flank pain or back pain.   ED Course: Vital signs afebrile, blood pressure elevated nonhypoxic.  CT showed large blood clot in bladder lumen with high attenuation in the left renal ureter system.  CBC BMP creatinine level largely normal.   ED placed in a three-way irrigation, patient however continued to experience severe pain despite irrigation yielded gradually clearing urine.  UA showed gross hematuria, no UTI.    Subjective: Seen this am Foley pinkish- but ? If needs flushing again Some pain in lower abdomen   Assessment and Plan: Principal Problem:   Gross hematuria Active Problems:   Acute urinary retention   Left renal mass   Gross hematuria w/ clot retention secondary to left renal biopsy:CBI remains in place urine appearing pinkies, urology currently involved keeping n.p.o. going for cystoscopy and clot evacuation today.Hold antiplatelets anticoagulants.  Hemoglobin stable and vitals stable.  Continue pain control.  Elevated BP likely from pain monitor  Left renal mass s/p biopsy he will follow-up with [facility] urology for report  DVT prophylaxis: SCDs Start: 10/01/22 1537 Code Status:   Code Status: Full Code Family Communication: plan of care discussed with patient at bedside. Patient status is:  admitted as observation but remains hospitalized for ongoing  because of gross hematuria Level of care: Med-Surg   Dispo: The patient is from: home            Anticipated disposition:  home1-2 days Objective: Vitals last 24 hrs: Vitals:   10/01/22 1720 10/01/22 2009 10/01/22 2320 10/02/22 0512  BP: (!) 131/106 (!) 152/108 (!) 154/103 115/89  Pulse: (!) 101 86 (!) 105 (!) 108  Resp: 18 18  16   Temp: 97.9 F (36.6 C) (!) 97.4 F (36.3 C) 98 F (36.7 C) 98.2 F (36.8 C)  TempSrc: Oral Oral Oral   SpO2: 95% 98% 97% 91%  Weight:    100.9 kg  Height:       Weight change: 0 kg  Physical Examination: General exam: alert awake, older than stated age HEENT:Oral mucosa moist, Ear/Nose WNL grossly Respiratory system: bilaterally clear BS, no use of accessory muscle Cardiovascular system: S1 & S2 +, No JVD. Gastrointestinal system: Abdomen soft,NT,ND, BS+ Nervous System:Alert, awake, moving extremities. Extremities: LE edema neg,distal peripheral pulses palpable.  Skin: No rashes,no icterus. MSK: Normal muscle bulk,tone, power Foley + w/ pinkish appearing urine on CBI Medications reviewed:  Scheduled Meds:  cyanocobalamin  1,000 mcg Oral Daily   Continuous Infusions:  promethazine (PHENERGAN) injection (IM or IVPB) 12.5 mg (10/02/22 0012)   sodium chloride irrigation     sodium chloride irrigation        Diet Order             Diet NPO time specified Except for: Sips with Meds  Diet effective midnight                  Intake/Output Summary (Last 24 hours) at 10/02/2022 0910 Last data filed at 10/02/2022 0656 Gross per 24 hour  Intake [id number] ml  Output 19125 ml  Net 2505 ml   Net IO Since Admission: 2,605 mL [10/02/22 0910]  Wt Readings from Last 3 Encounters:  10/02/22 100.9 kg  04/29/22 99.3 kg  04/13/22 96.6 kg     Unresulted Labs (From admission, onward)     Start     Ordered   10/02/22 0500  HIV Antibody (routine testing w rflx)  Once,   R        10/02/22 0500          Data Reviewed: I have personally reviewed following labs and imaging studies CBC: Recent Labs  Lab 10/01/22 0428 10/02/22 0250  WBC 6.0 9.6  NEUTROABS 4.0  --    HGB 14.7 13.8  HCT 43.4 40.9  MCV 83.0 83.1  PLT 189 194   Basic Metabolic Panel: Recent Labs  Lab 10/01/22 0428 10/02/22 0250  NA 138 139  K 3.9 4.6  CL 101 105  CO2 25 29  GLUCOSE 132* 135*  BUN 14 9  CREATININE 1.21 1.17  CALCIUM 8.9 8.7*  Antimicrobials: Anti-infectives (From admission, onward)    Start     Dose/Rate Route Frequency Ordered Stop   10/01/22 0615  cefTRIAXone (ROCEPHIN) 1 g in sodium chloride 0.9 % 100 mL IVPB        1 g 200 mL/hr over 30 Minutes Intravenous  Once 10/01/22 0612 10/01/22 0654      Culture/Microbiology No results found for: "SDES", "SPECREQUEST", "CULT", "REPTSTATUS"  Radiology Studies: CT Angio Abd/Pel w/ and/or w/o  Result Date: 10/01/2022 CLINICAL DATA:  52 year old male history of left renal mass biopsy yesterday. Frank hematuria. EXAM: CTA ABDOMEN AND PELVIS WITHOUT AND WITH CONTRAST TECHNIQUE: Multidetector CT imaging of the abdomen and pelvis was performed using the standard protocol during bolus administration of intravenous contrast. Multiplanar reconstructed images and MIPs were obtained and reviewed to evaluate the vascular anatomy. RADIATION DOSE REDUCTION: This exam was performed according to the departmental dose-optimization program which includes automated exposure control, adjustment of the mA and/or kV according to patient size and/or use of iterative reconstruction technique. CONTRAST:  75mL OMNIPAQUE IOHEXOL 350 MG/ML SOLN COMPARISON:  CT the abdomen and pelvis 04/26/2022. FINDINGS: VASCULAR Aorta: Normal caliber aorta without aneurysm, dissection, vasculitis or significant stenosis. Celiac: Patent without evidence of aneurysm, dissection, vasculitis or significant stenosis. SMA: Patent without evidence of aneurysm, dissection, vasculitis or significant stenosis. Renals: Right renal artery is normal in size and widely patent. Left renal artery is diminutive, but appears patent perfusing the poorly defined left renal mass. IMA:  Patent without evidence of aneurysm, dissection, vasculitis or significant stenosis. Inflow: Patent without evidence of aneurysm, dissection, vasculitis or significant stenosis. Proximal Outflow: Bilateral common femoral and visualized portions of the superficial and profunda femoral arteries are patent without evidence of aneurysm, dissection, vasculitis or significant stenosis. Veins: No obvious venous abnormality within the limitations of this arterial phase study. Review of the MIP images confirms the above findings. NON-VASCULAR Lower chest: Unremarkable. Hepatobiliary: No suspicious cystic or solid hepatic lesions. No intra or extrahepatic biliary ductal dilatation. Gallbladder is unremarkable in appearance. Pancreas: No pancreatic mass. No pancreatic ductal dilatation. No pancreatic or peripancreatic fluid collections or inflammatory changes. Spleen: Unremarkable. Adrenals/Urinary Tract: Slightly atrophic grossly distorted somewhat faceless appearing left kidney, grossly similar in appearance to remote prior study from 04/26/2022, with exception of some new gas within the collecting system of the kidney, presumably related to reported biopsy. Precontrast images demonstrate some high attenuation within the collecting  system of the left kidney, which may also reflect some mild post biopsy hemorrhage. No large perinephric fluid collection is identified at this time. Additionally, no definite active extravasation is confidently identified on postcontrast imaging. Abnormal soft tissue extends medially from the left kidney into the para-aortic region partially encasing the abdominal aorta from approximately 10:00 to 4:00, similar to the remote prior study. Additionally, some abnormal soft tissue extends caudally from the kidney apparently in association with the left ureter, then tracking medially to partially encompasses the infrarenal abdominal aorta as well as the aortic bifurcation in the proximal common iliac  arteries (right-greater-than-left), also similar to the prior study. 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 No right hydroureteronephrosis. Notably, there is a large amount of high attenuation material within the lumen of the urinary bladder, which is new compared to the prior study, and does not appear to enhance, presumably extensive bladder thrombus. Urinary bladder is otherwise nearly completely decompressed around an indwelling Foley balloon catheter. Stomach/Bowel: The appearance of the stomach is normal. There is no pathologic dilatation of small bowel or colon. The fat plane between the previously described left renal mass in the third portion of the duodenal is completely obscured such that some degree of direct duodenal invasion is not entirely excluded (axial image 41 of series 8), similar to prior CT examination. Normal appendix. Lymphatic: Enlarged left external iliac lymph node measuring 1.7 cm in short axis (axial image 91 of series 8) with poorly defined surrounding fat planes, similar to the prior study. Multiple other enlarged left inguinal lymph nodes which demonstrate increased enhancement, largest of which measures 1.4 cm (axial image 93 of series 8), similar to the prior study. Poorly defined nodal tissue along the left pelvic sidewall in the left internal and external iliac nodal distribution is also noted, also similar to the prior study. Reproductive: Prostate gland and seminal vesicles are unremarkable in appearance. Other: No significant volume of ascites.  No pneumoperitoneum. Musculoskeletal: There are no aggressive appearing lytic or blastic lesions noted in the visualized portions of the skeleton. IMPRESSION: VASCULAR 1. No acute vascular abnormality. Specifically, no definite active extravasation  associated with the left kidney to suggest active bleeding at this time. NON-VASCULAR 1. However, there is a large volume of clot within the lumen of the urinary bladder, in addition to high attenuation within the left renal collecting system, which likely reflects some post biopsy hemorrhage. Small amount of gas also noted in the left renal collecting system. 2. Very abnormal appearance of the left kidney which is grossly similar to prior study from 04/26/2022. Infiltrative neoplasm such as urothelial carcinoma or lymphoma warrants consideration, although chronic atypical infection such as tuberculosis could cause a similar appearance. Abnormal soft tissue extends medially from this left kidney in his locally invasive into the retroperitoneum, intimately associated with the abdominal aorta, duodenal, infrarenal abdominal aorta at the level of the aortic bifurcation, and appears to track along the left pelvic sidewall associated with left external iliac and inguinal lymphadenopathy, as above. 3. Additional incidental findings, as above. Electronically Signed   By: Trudie Reed M.D.   On: 10/01/2022 11:15     LOS: 0 days   Lanae Boast, MD [facility]  10/02/2022, 9:10 AM

## 2022-10-02 NOTE — Hospital Course (Signed)
52 y.o. male with medical history significant of left renal mass status post IR guided biopsy at [facility] on 09/30/22. After the procedure, patient has had been trouble to urinate.  When he did, only a small amount of bloody urine came out.  Overnight he developed 10/10 sharp pain in the lower abdomen and came to ED this morning.  Denies any fever chills denies any flank pain or back pain.   ED Course: Vital signs afebrile, blood pressure elevated nonhypoxic.  CT showed large blood clot in bladder lumen with high attenuation in the left renal ureter system.  CBC BMP creatinine level largely normal.   ED placed in a three-way irrigation, patient however continued to experience severe pain despite irrigation yielded gradually clearing urine.  UA showed gross hematuria, no UTI. Patient was continued on CBI, subsequently CBI stopped 4/7 evening and all night doing well no more nausea vomiting or pain, urine is clear at this time urology has cleared the patient keep the Foley for 2 to 3 days due to bladder distention and need outpatient follow-up for voiding trial

## 2022-10-02 NOTE — Progress Notes (Signed)
Pt has been NPO since midnight.  ? ?Keith Bishop M Keith Bishop ? ?

## 2022-10-03 DIAGNOSIS — R310 Gross hematuria: Secondary | ICD-10-CM | POA: Diagnosis not present

## 2022-10-03 DIAGNOSIS — N2889 Other specified disorders of kidney and ureter: Secondary | ICD-10-CM | POA: Diagnosis not present

## 2022-10-03 DIAGNOSIS — R338 Other retention of urine: Secondary | ICD-10-CM | POA: Diagnosis not present

## 2022-10-03 LAB — CBC
HCT: 39.5 % (ref 39.0–52.0)
Hemoglobin: 13.3 g/dL (ref 13.0–17.0)
MCH: 27.8 pg (ref 26.0–34.0)
MCHC: 33.7 g/dL (ref 30.0–36.0)
MCV: 82.6 fL (ref 80.0–100.0)
Platelets: 173 10*3/uL (ref 150–400)
RBC: 4.78 MIL/uL (ref 4.22–5.81)
RDW: 13.1 % (ref 11.5–15.5)
WBC: 8.1 10*3/uL (ref 4.0–10.5)
nRBC: 0 % (ref 0.0–0.2)

## 2022-10-03 MED ORDER — POLYETHYLENE GLYCOL 3350 17 G PO PACK
17.0000 g | PACK | Freq: Every day | ORAL | Status: DC
  Administered 2022-10-03: 17 g via ORAL
  Filled 2022-10-03 (×2): qty 1

## 2022-10-03 MED ORDER — METHOCARBAMOL 500 MG PO TABS
750.0000 mg | ORAL_TABLET | Freq: Three times a day (TID) | ORAL | Status: DC
  Administered 2022-10-03 – 2022-10-04 (×4): 750 mg via ORAL
  Filled 2022-10-03 (×4): qty 2

## 2022-10-03 MED ORDER — BACITRACIN-NEOMYCIN-POLYMYXIN OINTMENT TUBE: TOPICAL_OINTMENT | CUTANEOUS | Status: DC | PRN

## 2022-10-03 MED ORDER — SENNOSIDES-DOCUSATE SODIUM 8.6-50 MG PO TABS
1.0000 | ORAL_TABLET | Freq: Two times a day (BID) | ORAL | Status: DC
  Administered 2022-10-03 – 2022-10-04 (×3): 1 via ORAL
  Filled 2022-10-03 (×3): qty 1

## 2022-10-03 NOTE — Progress Notes (Signed)
Subjective: No acute events overnight. CBI has been off all night.  Urine remains clear without clots. His nausea has improved.  Objective: Vital signs in last 24 hours: Temp:  [97.8 F (36.6 C)-98.5 F (36.9 C)] 98.3 F (36.8 C) (04/08 0432) Pulse Rate:  [86-100] 86 (04/08 0432) Resp:  [17-18] 18 (04/08 0432) BP: (129-144)/(94-107) 129/95 (04/08 0432) SpO2:  [93 %-95 %] 95 % (04/08 0432)  Intake/Output from previous day: 04/07 0701 - 04/08 0700 In: 3180 [P.O.:480] Out: 1475 [Urine:1475] Intake/Output this shift: Total I/O In: 120 [P.O.:120] Out: 575 [Urine:575]  Physical Exam:  General: Alert and oriented Abdomen: Soft, ND GU:  foley in place draining clear urine, no clots Ext: NT, No erythema  Lab Results: Recent Labs    10/01/22 0428 10/02/22 0250 10/03/22 0343  HGB 14.7 13.8 13.3  HCT 43.4 40.9 39.5   BMET Recent Labs    10/01/22 0428 10/02/22 0250  NA 138 139  K 3.9 4.6  CL 101 105  CO2 25 29  GLUCOSE 132* 135*  BUN 14 9  CREATININE 1.21 1.17  CALCIUM 8.9 8.7*     Studies/Results: CT Angio Abd/Pel w/ and/or w/o  Result Date: 10/01/2022 CLINICAL DATA:  52 year old male history of left renal mass biopsy yesterday. Frank hematuria. EXAM: CTA ABDOMEN AND PELVIS WITHOUT AND WITH CONTRAST TECHNIQUE: Multidetector CT imaging of the abdomen and pelvis was performed using the standard protocol during bolus administration of intravenous contrast. Multiplanar reconstructed images and MIPs were obtained and reviewed to evaluate the vascular anatomy. RADIATION DOSE REDUCTION: This exam was performed according to the departmental dose-optimization program which includes automated exposure control, adjustment of the mA and/or kV according to patient size and/or use of iterative reconstruction technique. CONTRAST:  69mL OMNIPAQUE IOHEXOL 350 MG/ML SOLN COMPARISON:  CT the abdomen and pelvis 04/26/2022. FINDINGS: VASCULAR Aorta: Normal caliber aorta without aneurysm,  dissection, vasculitis or significant stenosis. Celiac: Patent without evidence of aneurysm, dissection, vasculitis or significant stenosis. SMA: Patent without evidence of aneurysm, dissection, vasculitis or significant stenosis. Renals: Right renal artery is normal in size and widely patent. Left renal artery is diminutive, but appears patent perfusing the poorly defined left renal mass. IMA: Patent without evidence of aneurysm, dissection, vasculitis or significant stenosis. Inflow: Patent without evidence of aneurysm, dissection, vasculitis or significant stenosis. Proximal Outflow: Bilateral common femoral and visualized portions of the superficial and profunda femoral arteries are patent without evidence of aneurysm, dissection, vasculitis or significant stenosis. Veins: No obvious venous abnormality within the limitations of this arterial phase study. Review of the MIP images confirms the above findings. NON-VASCULAR Lower chest: Unremarkable. Hepatobiliary: No suspicious cystic or solid hepatic lesions. No intra or extrahepatic biliary ductal dilatation. Gallbladder is unremarkable in appearance. Pancreas: No pancreatic mass. No pancreatic ductal dilatation. No pancreatic or peripancreatic fluid collections or inflammatory changes. Spleen: Unremarkable. Adrenals/Urinary Tract: Slightly atrophic grossly distorted somewhat faceless appearing left kidney, grossly similar in appearance to remote prior study from 04/26/2022, with exception of some new gas within the collecting system of the kidney, presumably related to reported biopsy. Precontrast images demonstrate some high attenuation within the collecting system of the left kidney, which may also reflect some mild post biopsy hemorrhage. No large perinephric fluid collection is identified at this time. Additionally, no definite active extravasation is confidently identified on postcontrast imaging. Abnormal soft tissue extends medially from the left kidney  into the para-aortic region partially encasing the abdominal aorta from approximately 10:00 to 4:00, similar to the remote  prior study. Additionally, some abnormal soft tissue extends caudally from the kidney apparently in association with the left ureter, then tracking medially to partially encompasses the infrarenal abdominal aorta as well as the aortic bifurcation in the proximal common iliac arteries (right-greater-than-left), also similar to the prior study. 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 No right hydroureteronephrosis. Notably, there is a large amount of high attenuation material within the lumen of the urinary bladder, which is new compared to the prior study, and does not appear to enhance, presumably extensive bladder thrombus. Urinary bladder is otherwise nearly completely decompressed around an indwelling Foley balloon catheter. Stomach/Bowel: The appearance of the stomach is normal. There is no pathologic dilatation of small bowel or colon. The fat plane between the previously described left renal mass in the third portion of the duodenal is completely obscured such that some degree of direct duodenal invasion is not entirely excluded (axial image 41 of series 8), similar to prior CT examination. Normal appendix. Lymphatic: Enlarged left external iliac lymph node measuring 1.7 cm in short axis (axial image 91 of series 8) with poorly defined surrounding fat planes, similar to the prior study. Multiple other enlarged left inguinal lymph nodes which demonstrate increased enhancement, largest of which measures 1.4 cm (axial image 93 of series 8), similar to the prior study. Poorly defined nodal tissue along the left pelvic sidewall in the left internal and external iliac nodal distribution is also noted, also similar  to the prior study. Reproductive: Prostate gland and seminal vesicles are unremarkable in appearance. Other: No significant volume of ascites.  No pneumoperitoneum. Musculoskeletal: There are no aggressive appearing lytic or blastic lesions noted in the visualized portions of the skeleton. IMPRESSION: VASCULAR 1. No acute vascular abnormality. Specifically, no definite active extravasation associated with the left kidney to suggest active bleeding at this time. NON-VASCULAR 1. However, there is a large volume of clot within the lumen of the urinary bladder, in addition to high attenuation within the left renal collecting system, which likely reflects some post biopsy hemorrhage. Small amount of gas also noted in the left renal collecting system. 2. Very abnormal appearance of the left kidney which is grossly similar to prior study from 04/26/2022. Infiltrative neoplasm such as urothelial carcinoma or lymphoma warrants consideration, although chronic atypical infection such as tuberculosis could cause a similar appearance. Abnormal soft tissue extends medially from this left kidney in his locally invasive into the retroperitoneum, intimately associated with the abdominal aorta, duodenal, infrarenal abdominal aorta at the level of the aortic bifurcation, and appears to track along the left pelvic sidewall associated with left external iliac and inguinal lymphadenopathy, as above. 3. Additional incidental findings, as above. Electronically Signed   By: Trudie Reed M.D.   On: 10/01/2022 11:15    Assessment/Plan: Gross hematuria secondary to bleeding after left renal biopsy Clot retention Left renal mass  Urine remains clear with CBI clamped Will d/c CBI Continue foley for another 2-3 days due to bladder distention Will need outpatient follow-up for voiding trial   LOS: 1 day   Di Kindle 10/03/2022, 6:30 AM
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
## 2022-10-03 NOTE — Progress Notes (Signed)
PROGRESS NOTE EVEREST WIGFALL  HOO:[id number] DOB: 1970-07-21 DOA: 10/01/2022 PCP: Eustaquio Boyden, MD  Brief Narrative/Hospital Course: 52 y.o. male with medical history significant of left renal mass status post IR guided biopsy at [facility] on 09/30/22. After the procedure, patient has had been trouble to urinate.  When he did, only a small amount of bloody urine came out.  Overnight he developed 10/10 sharp pain in the lower abdomen and came to ED this morning.  Denies any fever chills denies any flank pain or back pain.   ED Course: Vital signs afebrile, blood pressure elevated nonhypoxic.  CT showed large blood clot in bladder lumen with high attenuation in the left renal ureter system.  CBC BMP creatinine level largely normal.   ED placed in a three-way irrigation, patient however continued to experience severe pain despite irrigation yielded gradually clearing urine.  UA showed gross hematuria, no UTI. Patient was continued on CBI, subsequently CBI stopped 4/7 evening and all night doing well no more nausea vomiting or pain, urine is clear at this time urology has cleared the patient keep the Foley for 2 to 3 days due to bladder distention and need outpatient follow-up for voiding trial    Subjective:  Seen and examined this morning.  Reports he is having right flank pain and does not feel comfortable going home today  Urine is clearing up on Foley and planning to continue for 2 to 3 days.      Assessment and Plan: Principal Problem:   Gross hematuria Active Problems:   Acute urinary retention   Left renal mass   Gross hematuria w/ clot retention secondary to left renal biopsy:continued on CBI, subsequently CBI stopped 4/7 evening and all night doing well no more nausea vomiting or pain, urine is clear at this time urology planning to keep the Foley for 2 to 3 days due to bladder distention and need outpatient follow-up for voiding trial.  Complains of right flank pain continue on pain  management add muscle relaxant  Constipation added MiraLAX and Senokot.  Elevated BP likely from pain monitor Left renal mass s/p biopsy he will follow-up with [facility] urology for report  DVT prophylaxis: SCDs Start: 10/01/22 1537 Code Status:   Code Status: Full Code Family Communication: plan of care discussed with patient at bedside. Patient status is:  inpatient due to ongoing flank pain constipation   Level of care: Med-Surg  Dispo: The patient is from: home            Anticipated disposition: home tomorrow. Objective: Vitals last 24 hrs: Vitals:   10/02/22 1643 10/02/22 2143 10/03/22 0432 10/03/22 0802  BP: (!) 137/105 (!) 137/94 (!) 129/95 (!) 148/79  Pulse: 96 100 86 77  Resp: 18 17 18 16   Temp: 98.2 F (36.8 C) 98.4 F (36.9 C) 98.3 F (36.8 C) 97.9 F (36.6 C)  TempSrc: Oral Oral Oral Oral  SpO2: 95% 94% 95% 94%  Weight:      Height:       Weight change:   Physical Examination: General exam: AAox3, weak,older appearing HEENT:Oral mucosa moist, Ear/Nose WNL grossly, dentition normal. Respiratory system: bilaterally clear BS, no use of accessory muscle Cardiovascular system: S1 & S2 +, regular rate. Gastrointestinal system: Abdomen soft, NT,ND,BS+ Nervous System:Alert, awake, moving extremities and grossly nonfocal Extremities: LE ankle edema neg, lower extremities warm Skin: No rashes,no icterus. MSK: Normal muscle bulk,tone, power.   Medications reviewed:  Scheduled Meds:  cefdinir  300 mg Oral Q12H  Chlorhexidine Gluconate Cloth  6 each Topical Daily   cyanocobalamin  1,000 mcg Oral Daily   methocarbamol  750 mg Oral TID   polyethylene glycol  17 g Oral Daily   senna-docusate  1 tablet Oral BID   Continuous Infusions:  promethazine (PHENERGAN) injection (IM or IVPB) 12.5 mg (10/02/22 0012)      Diet Order             Diet regular Room service appropriate? Yes; Fluid consistency: Thin  Diet effective now                  Intake/Output  Summary (Last 24 hours) at 10/03/2022 1320 Last data filed at 10/03/2022 0900 Gross per 24 hour  Intake 700 ml  Output 1025 ml  Net -325 ml    Net IO Since Admission: 4,330 mL [10/03/22 1320]  Wt Readings from Last 3 Encounters:  10/02/22 100.9 kg  04/29/22 99.3 kg  04/13/22 96.6 kg     Unresulted Labs (From admission, onward)     Start     Ordered   10/03/22 0500  CBC  Daily,   R     Question:  Specimen collection method  Answer:  Lab=Lab collect   10/02/22 1137          Data Reviewed: I have personally reviewed following labs and imaging studies CBC: Recent Labs  Lab 10/01/22 0428 10/02/22 0250 10/03/22 0343  WBC 6.0 9.6 8.1  NEUTROABS 4.0  --   --   HGB 14.7 13.8 13.3  HCT 43.4 40.9 39.5  MCV 83.0 83.1 82.6  PLT 189 194 173    Basic Metabolic Panel: Recent Labs  Lab 10/01/22 0428 10/02/22 0250  NA 138 139  K 3.9 4.6  CL 101 105  CO2 25 29  GLUCOSE 132* 135*  BUN 14 9  CREATININE 1.21 1.17  CALCIUM 8.9 8.7*   Antimicrobials: Anti-infectives (From admission, onward)    Start     Dose/Rate Route Frequency Ordered Stop   10/02/22 1430  cefdinir (OMNICEF) capsule 300 mg        300 mg Oral Every 12 hours 10/02/22 1344     10/01/22 0615  cefTRIAXone (ROCEPHIN) 1 g in sodium chloride 0.9 % 100 mL IVPB        1 g 200 mL/hr over 30 Minutes Intravenous  Once 10/01/22 0612 10/01/22 0654      Culture/Microbiology No results found for: "SDES", "SPECREQUEST", "CULT", "REPTSTATUS"  Radiology Studies: No results found.   LOS: 1 day   Lanae Boast, MD [facility]  10/03/2022, 1:20 PM

## 2022-10-03 NOTE — Discharge Summary (Signed)
Physician Discharge Summary  Keith Bishop ZOX:[id number] DOB: 10/04/1970 DOA: 10/01/2022  PCP: Eustaquio BoydenGutierrez, Javier, MD  Admit date: 10/01/2022 Discharge date: 10/04/2022 Recommendations for Outpatient Follow-up:  Follow up with PCP in 1 weeks-call for appointment Please obtain BMP/CBC in one week Follow-up with urology Dr Pete GlatterStoneking for voiding trial  Discharge Dispo: Home Discharge Condition: Stable Code Status:   Code Status: Full Code Diet recommendation:  Diet Order             Diet regular Room service appropriate? Yes; Fluid consistency: Thin  Diet effective now                    Brief/Interim Summary: 52 y.o. male with medical history significant of left renal mass status post IR guided [facility]. After the procedure, patient has had been trouble to urinate.  When he did, only a small amount of bloody urine came out.  Overnight he developed 10/10 sharp pain in the lower abdomen and came to ED this morning.  Denies any fever chills denies any flank pain or back pain.   ED Course: Vital signs afebrile, blood pressure elevated nonhypoxic.  CT showed large blood clot in bladder lumen with high attenuation in the left renal ureter system.  CBC BMP creatinine level largely normal.   ED placed in a three-way irrigation, patient however continued to experience severe pain despite irrigation yielded gradually clearing urine.  UA showed gross hematuria, no UTI. Patient was continued on CBI, subsequently CBI stopped 4/7 evening and all night doing well no more nausea vomiting or pain, urine is clear at this time urology has cleared the patient keep the Foley for 2 to 3 days due to bladder distention and need outpatient follow-up for voiding trial  Overall patient feels much improved he feels ready for discharge home no more flank pain. He does not want to take suppository but will continue on stool softener and MiraLAX. He already has an appointment to see urology coming  Thursday.  Discharge Diagnoses:  Principal Problem:   Gross hematuria Active Problems:   Acute urinary retention   Left renal mass  Gross hematuria w/ clot retention secondary to left renal biopsy:continued on CBI, subsequently CBI stopped 4/7 evening and all night doing well no more nausea vomiting or pain, urine is clear at this time urology has cleared the patient keep the Foley for 2 to 3 days due to bladder distention and need outpatient follow-up for voiding trial  Overall patient feels much improved he feels ready for discharge home no more flank pain. He does not want to take suppository but will continue on stool softener and MiraLAX. He already has an appointment to see urology coming Thursday  Elevated WJ:XBJYNWBP:likely from pain monitor Left renal mass s/p biopsy he [facility]  Consults: urology Subjective: Alert awake and resting comfortably feels well enough to go home today no flank pain  Discharge Exam: Vitals:   10/04/22 0456 10/04/22 0755  BP: (!) 147/95 (!) 132/97  Pulse: 95 90  Resp: 19 18  Temp: 98.2 F (36.8 C) 98 F (36.7 C)  SpO2: 95% 97%   General: Pt is alert, awake, not in acute distress Cardiovascular: RRR, S1/S2 +, no rubs, no gallops Respiratory: CTA bilaterally, no wheezing, no rhonchi Abdominal: Soft, NT, ND, bowel sounds + Extremities: no edema, no cyanosis  Discharge Instructions  Discharge Instructions     Discharge instructions   Complete by: As  directed    Follow-up with PCP for routine recheck in 1 week.  Follow-up with urology Dr. Pete Glatter for Foley care and interval in the office  Please call call MD or return to ER for similar or worsening recurring problem that brought you to hospital or if any fever,nausea/vomiting,abdominal pain, uncontrolled pain, chest pain,  shortness of breath or any other alarming symptoms.  Please follow-up your doctor as instructed in a week time and call the office for  appointment.  Please avoid alcohol, smoking, or any other illicit substance and maintain healthy habits including taking your regular medications as prescribed.  You were cared for by a hospitalist during your hospital stay. If you have any questions about your discharge medications or the care you received while you were in the hospital after you are discharged, you can call the unit and ask to speak with the hospitalist on call if the hospitalist that took care of you is not available.  Once you are discharged, your primary care physician will handle any further medical issues. Please note that NO REFILLS for any discharge medications will be authorized once you are discharged, as it is imperative that you return to your primary care physician (or establish a relationship with a primary care physician if you do not have one) for your aftercare needs so that they can reassess your need for medications and monitor your lab values   Increase activity slowly   Complete by: As directed       Allergies as of 10/04/2022   No Known Allergies      Medication List     TAKE these medications    acetaminophen 500 MG tablet Commonly known as: TYLENOL Take 500 mg by mouth every 6 (six) hours as needed.   cyanocobalamin 1000 MCG tablet Commonly known as: VITAMIN B12 Take 1 tablet (1,000 mcg total) by mouth daily.   doxycycline 100 MG capsule Commonly known as: VIBRAMYCIN Take 100 mg by mouth 2 (two) times daily.   ibuprofen 200 MG tablet Commonly known as: ADVIL Take 200 mg by mouth every 6 (six) hours as needed.   LORazepam 0.5 MG tablet Commonly known as: ATIVAN Take 0.5-1 tablets (0.25-0.5 mg total) by mouth 2 (two) times daily as needed for anxiety.   Melatonin 10 MG Subl Place under the tongue at bedtime. As needed   methocarbamol 500 MG tablet Commonly known as: ROBAXIN Take 1 tablet (500 mg total) by mouth 3 (three) times daily as needed for muscle spasms.   oxybutynin 5 MG  tablet Commonly known as: DITROPAN Take 1 tablet (5 mg total) by mouth every 8 (eight) hours as needed for up to 15 days for bladder spasms.   polyethylene glycol 17 g packet Commonly known as: MIRALAX / GLYCOLAX Take 17 g by mouth daily for 7 days.   senna-docusate 8.6-50 MG tablet Commonly known as: Senokot-S Take 1 tablet by mouth 2 (two) times daily.   Vitamin D3 25 MCG (1000 UT) capsule Generic drug: Cholecalciferol Take 1 capsule (1,000 Units total) by mouth daily.        Follow-up Information     Stoneking, Danford Bad., MD. Schedule an appointment as soon as possible for a visit in 5 days.   Specialty: Urology Contact information: [address] [place] [address] [phone]         [facility] Emergency Department at [facility] .   Specialty: Emergency Medicine Why: If symptoms worsen Contact information:  [address] [id number] mc [place] [address] [phone]        Eustaquio Boyden, MD Follow up in 1 week(s).   Specialty: Family Medicine Contact information: [address] [place] [address] [phone]                No Known Allergies  The results of significant diagnostics from this hospitalization (including imaging, microbiology, ancillary and laboratory) are listed below for reference.    Microbiology: No results found for this or any previous visit (from the past 240 hour(s)).  Procedures/Studies: CT Angio Abd/Pel w/ and/or w/o  Result Date: 10/01/2022 CLINICAL DATA:  52 year old male history of left renal mass biopsy yesterday. Frank hematuria. EXAM: CTA ABDOMEN AND PELVIS WITHOUT AND WITH CONTRAST TECHNIQUE: Multidetector CT imaging of the abdomen and pelvis was performed using the standard protocol during bolus administration of intravenous contrast. Multiplanar reconstructed images and MIPs were obtained and reviewed to evaluate the vascular anatomy. RADIATION  DOSE REDUCTION: This exam was performed according to the departmental dose-optimization program which includes automated exposure control, adjustment of the mA and/or kV according to patient size and/or use of iterative reconstruction technique. CONTRAST:  75mL OMNIPAQUE IOHEXOL 350 MG/ML SOLN COMPARISON:  CT the abdomen and pelvis 04/26/2022. FINDINGS: VASCULAR Aorta: Normal caliber aorta without aneurysm, dissection, vasculitis or significant stenosis. Celiac: Patent without evidence of aneurysm, dissection, vasculitis or significant stenosis. SMA: Patent without evidence of aneurysm, dissection, vasculitis or significant stenosis. Renals: Right renal artery is normal in size and widely patent. Left renal artery is diminutive, but appears patent perfusing the poorly defined left renal mass. IMA: Patent without evidence of aneurysm, dissection, vasculitis or significant stenosis. Inflow: Patent without evidence of aneurysm, dissection, vasculitis or significant stenosis. Proximal Outflow: Bilateral common femoral and visualized portions of the superficial and profunda femoral arteries are patent without evidence of aneurysm, dissection, vasculitis or significant stenosis. Veins: No obvious venous abnormality within the limitations of this arterial phase study. Review of the MIP images confirms the above findings. NON-VASCULAR Lower chest: Unremarkable. Hepatobiliary: No suspicious cystic or solid hepatic lesions. No intra or extrahepatic biliary ductal dilatation. Gallbladder is unremarkable in appearance. Pancreas: No pancreatic mass. No pancreatic ductal dilatation. No pancreatic or peripancreatic fluid collections or inflammatory changes. Spleen: Unremarkable. Adrenals/Urinary Tract: Slightly atrophic grossly distorted somewhat faceless appearing left kidney, grossly similar in appearance to remote prior study from 04/26/2022, with exception of some new gas within the collecting system of the kidney, presumably  related to reported biopsy. Precontrast images demonstrate some high attenuation within the collecting system of the left kidney, which may also reflect some mild post biopsy hemorrhage. No large perinephric fluid collection is identified at this time. Additionally, no definite active extravasation is confidently identified on postcontrast imaging. Abnormal soft tissue extends medially from the left kidney into the para-aortic region partially encasing the abdominal aorta from approximately 10:00 to 4:00, similar to the remote prior study. Additionally, some abnormal soft tissue extends caudally from the kidney apparently in association with the left ureter, then tracking medially to partially encompasses the infrarenal abdominal aorta as well as the aortic bifurcation in the proximal common iliac arteries (right-greater-than-left), also similar to the prior study. 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 No right hydroureteronephrosis. Notably, there is a large amount of high  attenuation material within the lumen of the urinary bladder, which is new compared to the prior study, and does not appear to enhance, presumably extensive bladder thrombus. Urinary bladder is otherwise nearly completely decompressed around an indwelling Foley balloon catheter. Stomach/Bowel: The appearance of the stomach is normal. There is no pathologic dilatation of small bowel or colon. The fat plane between the previously described left renal mass in the third portion of the duodenal is completely obscured such that some degree of direct duodenal invasion is not entirely excluded (axial image 41 of series 8), similar to prior CT examination. Normal appendix. Lymphatic: Enlarged left external iliac lymph node measuring 1.7 cm in short axis (axial image 91 of  series 8) with poorly defined surrounding fat planes, similar to the prior study. Multiple other enlarged left inguinal lymph nodes which demonstrate increased enhancement, largest of which measures 1.4 cm (axial image 93 of series 8), similar to the prior study. Poorly defined nodal tissue along the left pelvic sidewall in the left internal and external iliac nodal distribution is also noted, also similar to the prior study. Reproductive: Prostate gland and seminal vesicles are unremarkable in appearance. Other: No significant volume of ascites.  No pneumoperitoneum. Musculoskeletal: There are no aggressive appearing lytic or blastic lesions noted in the visualized portions of the skeleton. IMPRESSION: VASCULAR 1. No acute vascular abnormality. Specifically, no definite active extravasation associated with the left kidney to suggest active bleeding at this time. NON-VASCULAR 1. However, there is a large volume of clot within the lumen of the urinary bladder, in addition to high attenuation within the left renal collecting system, which likely reflects some post biopsy hemorrhage. Small amount of gas also noted in the left renal collecting system. 2. Very abnormal appearance of the left kidney which is grossly similar to prior study from 04/26/2022. Infiltrative neoplasm such as urothelial carcinoma or lymphoma warrants consideration, although chronic atypical infection such as tuberculosis could cause a similar appearance. Abnormal soft tissue extends medially from this left kidney in his locally invasive into the retroperitoneum, intimately associated with the abdominal aorta, duodenal, infrarenal abdominal aorta at the level of the aortic bifurcation, and appears to track along the left pelvic sidewall associated with left external iliac and inguinal lymphadenopathy, as above. 3. Additional incidental findings, as above. Electronically Signed   By: Trudie Reed M.D.   On: 10/01/2022 11:15    Labs: BNP  (last 3 results) No results for input(s): "BNP" in the last 8760 hours. Basic Metabolic Panel: Recent Labs  Lab 10/01/22 0428 10/02/22 0250  NA 138 139  K 3.9 4.6  CL 101 105  CO2 25 29  GLUCOSE 132* 135*  BUN 14 9  CREATININE 1.21 1.17  CALCIUM 8.9 8.7*   Recent Labs  Lab 10/01/22 0428 10/02/22 0250 10/03/22 0343 10/04/22 0639  WBC 6.0 9.6 8.1 8.5  NEUTROABS 4.0  --   --   --   HGB 14.7 13.8 13.3 12.9*  HCT 43.4 40.9 39.5 38.9*  MCV 83.0 83.1 82.6 83.8  PLT 189 194 173 177  No results for input(s): "VITAMINB12", "FOLATE", "FERRITIN", "TIBC", "IRON", "RETICCTPCT" in the last 72 hours. Urinalysis    Component Value Date/Time   COLORURINE RED (A) 10/01/2022 0611   APPEARANCEUR CLOUDY (A) 10/01/2022 0611   LABSPEC 1.015 10/01/2022 0611   PHURINE 6.5 10/01/2022 0611   GLUCOSEU NEGATIVE 10/01/2022 0611   GLUCOSEU NEGATIVE 04/29/2022 1202   HGBUR LARGE (A) 10/01/2022 0611   HGBUR negative 07/19/2010 1531  BILIRUBINUR NEGATIVE 10/01/2022 0611   BILIRUBINUR negative 05/05/2020 0909   KETONESUR NEGATIVE 10/01/2022 0611   PROTEINUR 100 (A) 10/01/2022 0611   UROBILINOGEN 0.2 04/29/2022 1202   NITRITE NEGATIVE 10/01/2022 0611   LEUKOCYTESUR NEGATIVE 10/01/2022 0611   Sepsis Labs Recent Labs  Lab 10/01/22 0428 10/02/22 0250 10/03/22 0343 10/04/22 0639  WBC 6.0 9.6 8.1 8.5   Microbiology No results found for this or any previous visit (from the past 240 hour(s)). Time coordinating discharge: 25 minutes  SIGNED: Lanae Boast, MD  [facility] 10/04/2022, 11:20 AM  If 7PM-7AM, please contact night-coverage [email or web address]

## 2022-10-04 DIAGNOSIS — R310 Gross hematuria: Secondary | ICD-10-CM | POA: Diagnosis not present

## 2022-10-04 LAB — CBC
HCT: 38.9 % — ABNORMAL LOW (ref 39.0–52.0)
Hemoglobin: 12.9 g/dL — ABNORMAL LOW (ref 13.0–17.0)
MCH: 27.8 pg (ref 26.0–34.0)
MCHC: 33.2 g/dL (ref 30.0–36.0)
MCV: 83.8 fL (ref 80.0–100.0)
Platelets: 177 10*3/uL (ref 150–400)
RBC: 4.64 MIL/uL (ref 4.22–5.81)
RDW: 13 % (ref 11.5–15.5)
WBC: 8.5 10*3/uL (ref 4.0–10.5)
nRBC: 0 % (ref 0.0–0.2)

## 2022-10-04 MED ORDER — OXYBUTYNIN CHLORIDE 5 MG PO TABS: 5.0000 mg | ORAL_TABLET | Freq: Three times a day (TID) | ORAL | 0 refills | Status: DC | PRN

## 2022-10-04 MED ORDER — POLYETHYLENE GLYCOL 3350 17 G PO PACK: 17.0000 g | PACK | Freq: Every day | ORAL | 0 refills | Status: AC

## 2022-10-04 MED ORDER — METHOCARBAMOL 500 MG PO TABS: 500.0000 mg | ORAL_TABLET | Freq: Three times a day (TID) | ORAL | 0 refills | Status: AC | PRN

## 2022-10-04 MED ORDER — SENNOSIDES-DOCUSATE SODIUM 8.6-50 MG PO TABS: 1.0000 | ORAL_TABLET | Freq: Two times a day (BID) | ORAL | 0 refills | Status: AC

## 2022-10-04 NOTE — Plan of Care (Signed)
  Problem: Clinical Measurements: Goal: Ability to maintain clinical measurements within normal limits will improve Outcome: Progressing   

## 2022-10-04 NOTE — Progress Notes (Signed)
Explained discharge instructions to patient. Reviewed follow up appointment and next medication administration times. Patient's RN  reviewed foley education with the patient and his wife. Patient verbalized having an understanding for instructions given. All belongings are in the patient's possession. IV was removed. CCMD was notified. No other needs verbalized. Transported downstairs for discharge.

## 2022-10-05 ENCOUNTER — Telehealth: Payer: Self-pay

## 2022-10-05 NOTE — Transitions of Care (Post Inpatient/ED Visit) (Signed)
   10/05/2022  Name: Keith Bishop MRN: [id number] DOB: 07-07-1970  Today's TOC FU Call Status: Today's TOC FU Call Status:: Successful TOC FU Call Competed TOC FU Call Complete Date: 10/05/22  Transition Care Management Follow-up Telephone Call Date of Discharge: 10/04/22 Dis[facility]) Type of Discharge: Inpatient Admission Primary Inpatient Discharge Diagnosis:: Gross hematuria How have you been since you were released from the hospital?: Better Any questions or concerns?: No  Items Reviewed: Did you receive and understand the discharge instructions provided?: Yes Medications obtained and verified?: Yes (Medications Reviewed) Any new allergies since your discharge?: No Dietary orders reviewed?: NA Do you have support at home?: Yes People in Home: significant other  Home Care and Equipment/Supplies: Were Home Health Services Ordered?: NA Any new equipment or medical supplies ordered?: NA  Functional Questionnaire: Do you need assistance with bathing/showering or dressing?: No Do you need assistance with meal preparation?: No Do you need assistance with eating?: No Do you have difficulty maintaining continence: No Do you need assistance with getting out of bed/getting out of a chair/moving?: No Do you have difficulty managing or taking your medications?: No  Follow up appointments reviewed: PCP Follow-up appointment confirmed?: Yes Date of PCP follow-up appointment?: 10/12/22 Follow-up Provider: Dr. Sharen Hones [facility] Follow-up appointment confirmed?: Yes Date of Specialist follow-up appointment?: 10/06/22 Follow-Up Specialty Provider:: Dr. Pete Glatter- Urology Do you need transportation to your follow-up appointment?: No Do you understand care options if your condition(s) worsen?: Yes-patient verbalized understanding    Agnes Lawrence, CMA (AAMA)  [facility] [phone]

## 2022-10-06 ENCOUNTER — Encounter: Payer: Self-pay | Admitting: Urology

## 2022-10-06 ENCOUNTER — Ambulatory Visit (INDEPENDENT_AMBULATORY_CARE_PROVIDER_SITE_OTHER): Payer: 59 | Admitting: Urology

## 2022-10-06 ENCOUNTER — Encounter: Payer: Self-pay | Admitting: Family Medicine

## 2022-10-06 VITALS — BP 151/100 | HR 105 | Ht 75.0 in | Wt 220.0 lb

## 2022-10-06 DIAGNOSIS — N2889 Other specified disorders of kidney and ureter: Secondary | ICD-10-CM

## 2022-10-06 DIAGNOSIS — R338 Other retention of urine: Secondary | ICD-10-CM

## 2022-10-06 DIAGNOSIS — R310 Gross hematuria: Secondary | ICD-10-CM

## 2022-10-06 MED ORDER — CIPROFLOXACIN HCL 500 MG PO TABS
500.0000 mg | ORAL_TABLET | Freq: Two times a day (BID) | ORAL | Status: AC
Start: 2022-10-06 — End: 2022-10-07
  Administered 2022-10-06: 500 mg via ORAL

## 2022-10-06 NOTE — Progress Notes (Signed)
Fill and Pull Catheter Removal  Patient is present today for a catheter removal.  Patient was cleaned and prepped in a sterile fashion of sterile water/ saline was instilled into the bladder when the patient felt the urge to urinate, 61ml of water was then drained from the balloon.  A 22FR foley cath was removed from the bladder no complications were noted .  Patient as then given some time to void on their own.  Patient can void  on their own after some time.  Patient tolerated well.  Performed by: Idelle Crouch., CMA(AAMA)
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
## 2022-10-06 NOTE — Progress Notes (Addendum)
Ph: [phone]       Fax: [phone]   Patient ID: Keith Bishop, male    DOB: 1971/05/29, 52 y.o.   MRN: [id number]  This visit was conducted in person.  BP 136/82   Pulse 88   Temp 97.6 F (36.4 C) (Temporal)   Ht 6\' 3"  (1.905 m)   Wt 219 lb 8 oz (99.6 kg)   SpO2 95%   BMI 27.44 kg/m   BP Readings from Last 3 Encounters:  10/12/22 136/82  10/06/22 (!) 151/100  10/04/22 (!) 132/97   CC: hosp f/u visit  Subjective:   HPI: Keith Bishop is a 52 y.o. male presenting on 10/12/2022 for Hospitalization Follow-up (Admitted on 10/01/22 at [facility], dx acute urinary retention; gross hematuria. )   Recent hospitalization at [facility] for acute urinary retention due to gross hematuria with clots after left renal biopsy by IR at [facility] on 09/30/2022, done for large intra-abdominal mass involving left kidney. Treated with continuous bladder irrigation, discharged with foley in place, saw Dr Pete Glatter at [facility] in [facility] s/p foley removal and voiding trial.  Hospital records reviewed. Med rec performed.   Biopsy results - dense hyalinized fibroconnective tissue, insufficient cells for definitive diagnosis. Has had several prior cervical lymph node biopsies showing necrotizing granulomatous inflammation, all nondiagnostic (02/2005, 05/2006, 03/2011).   Saw Dr Pearlean Brownie [facility] surgical oncology earlier today, note pending - inconclusive biopsy results. Has been referred to [facility] urology and heme/onc to discuss further diagnosis options.   Since home, leg swelling is better as he's been out of work for 2 weeks. He is voiding well without further clots. He's been taking flomax and pyridium. Has f/u appt with [facility] urology tomorrow.   Requests new Rx for thigh high compression stockings.  Requests FMLA forms filled out - done today. Planning to return to work on Monday 10/17/2022  Home health not set up.  Other follow up appointments scheduled: Dr Pearlean Brownie urology at [facility] 10/12/2022, 10/13/2022 Dr  Pete Glatter urology in [facility] ______________________________________________________________________ Hospital admission: 10/01/2022 Hospital discharge: 10/04/2022 TCM f/u phone call:  performed 10/05/2022  Discharge Diagnoses:  Principal Problem:   Gross hematuria Active Problems:   Acute urinary retention   Left renal mass  Recommendations for Outpatient Follow-up:  Follow up with PCP in 1 weeks-call for appointment Please obtain BMP/CBC in one week Follow-up with urology Dr Pete Glatter for voiding trial   Discharge Dispo: Home Discharge Condition: Stable Code Status:   Code Status: Full Code   From prior notes: Longstanding history of unexplained LAD.   Had bone marrow biopsy at age 4yo - negative.    Lymph node biopsy Date: 06/12/1996 Right supraclav area-negative  Lymph node biopsy Date: 05/29/06 right cervical-negative (necrotizing granulomatous inflammation)  [place] core biopsy of left supraclavicular LN Date: 03/2011 - lymphoid tissue, QNS for flow cytometry He decided to defer further diagnostic tseting.   Has previously tested negative for tuberculosis, HIV, syphilis, hep C, ANA negative.  Mildly elevated ESR 30s with normal CRP     Relevant past medical, surgical, family and social history reviewed and updated as indicated. Interim medical history since our last visit reviewed. Allergies and medications reviewed and updated. Outpatient Medications Prior to Visit  Medication Sig Dispense Refill   acetaminophen (TYLENOL) 500 MG tablet Take 500 mg by mouth every 6 (six) hours as needed.     Cholecalciferol (VITAMIN D3) 25 MCG (1000 UT) CAPS Take 1 capsule (1,000 Units total) by mouth daily. 30  capsule    cyanocobalamin (VITAMIN B12) 1000 MCG tablet Take 1 tablet (1,000 mcg total) by mouth daily.     ibuprofen (ADVIL) 200 MG tablet Take 200 mg by mouth every 6 (six) hours as needed.     Melatonin 10 MG SUBL Place under the tongue at bedtime. As needed     methocarbamol (ROBAXIN)  500 MG tablet Take 1 tablet (500 mg total) by mouth 3 (three) times daily as needed for muscle spasms. 30 tablet 0   phenazopyridine (PYRIDIUM) 200 MG tablet Take 1 tablet (200 mg total) by mouth 3 (three) times daily as needed for pain. 20 tablet 1   senna-docusate (SENOKOT-S) 8.6-50 MG tablet Take 1 tablet by mouth 2 (two) times daily. 60 tablet 0   tamsulosin (FLOMAX) 0.4 MG CAPS capsule Take 1 capsule (0.4 mg total) by mouth daily. 30 capsule 1   LORazepam (ATIVAN) 0.5 MG tablet Take 0.5-1 tablets (0.25-0.5 mg total) by mouth 2 (two) times daily as needed for anxiety. 30 tablet 0   polyethylene glycol (MIRALAX / GLYCOLAX) 17 g packet Take 17 g by mouth daily for 7 days. 7 packet 0   No facility-administered medications prior to visit.     Per HPI unless specifically indicated in ROS section below Review of Systems  Objective:  BP 136/82   Pulse 88   Temp 97.6 F (36.4 C) (Temporal)   Ht 6\' 3"  (1.905 m)   Wt 219 lb 8 oz (99.6 kg)   SpO2 95%   BMI 27.44 kg/m   Wt Readings from Last 3 Encounters:  10/12/22 219 lb 8 oz (99.6 kg)  10/06/22 220 lb (99.8 kg)  10/02/22 222 lb 7.1 oz (100.9 kg)      Physical Exam Vitals and nursing note reviewed.  Constitutional:      Appearance: Normal appearance. He is not ill-appearing.  Eyes:     Extraocular Movements: Extraocular movements intact.     Pupils: Pupils are equal, round, and reactive to light.  Cardiovascular:     Rate and Rhythm: Normal rate and regular rhythm.     Pulses: Normal pulses.     Heart sounds: Normal heart sounds. No murmur heard. Pulmonary:     Effort: Pulmonary effort is normal. No respiratory distress.     Breath sounds: Normal breath sounds. No wheezing, rhonchi or rales.  Abdominal:     General: Bowel sounds are normal. There is no distension.     Palpations: Abdomen is soft. There is no mass.     Tenderness: There is no abdominal tenderness. There is no guarding or rebound.     Hernia: No hernia is  present.  Musculoskeletal:     Right lower leg: No edema.     Left lower leg: Edema present.     Comments: Wearing compression sock  Skin:    General: Skin is warm and dry.     Findings: No rash.  Neurological:     Mental Status: He is alert.  Psychiatric:        Mood and Affect: Mood normal.        Behavior: Behavior normal.       Results for orders placed or performed during the hospital encounter of 10/01/22  Urinalysis, w/ Reflex to Culture (Infection Suspected) -Urine, Catheterized  Result Value Ref Range   Specimen Source URINE, CATHETERIZED    Color, Urine RED (A) YELLOW   APPearance CLOUDY (A) CLEAR   Specific Gravity, Urine 1.015 1.005 - 1.030  pH 6.5 5.0 - 8.0   Glucose, UA NEGATIVE NEGATIVE mg/dL   Hgb urine dipstick LARGE (A) NEGATIVE   Bilirubin Urine NEGATIVE NEGATIVE   Ketones, ur NEGATIVE NEGATIVE mg/dL   Protein, ur 161 (A) NEGATIVE mg/dL   Nitrite NEGATIVE NEGATIVE   Leukocytes,Ua NEGATIVE NEGATIVE   Squamous Epithelial / HPF 0-5 0 - 5 /HPF   WBC, UA 0-5 0 - 5 WBC/hpf   RBC / HPF >50 0 - 5 RBC/hpf   Bacteria, UA FEW (A) NONE SEEN  Basic metabolic panel  Result Value Ref Range   Sodium 138 135 - 145 mmol/L   Potassium 3.9 3.5 - 5.1 mmol/L   Chloride 101 98 - 111 mmol/L   CO2 25 22 - 32 mmol/L   Glucose, Bld 132 (H) 70 - 99 mg/dL   BUN 14 6 - 20 mg/dL   Creatinine, Ser 0.96 0.61 - 1.24 mg/dL   Calcium 8.9 8.9 - 04.5 mg/dL   GFR, Estimated >40 >98 mL/min   Anion gap 12 5 - 15  CBC with Differential  Result Value Ref Range   WBC 6.0 4.0 - 10.5 K/uL   RBC 5.23 4.22 - 5.81 MIL/uL   Hemoglobin 14.7 13.0 - 17.0 g/dL   HCT 11.9 14.7 - 82.9 %   MCV 83.0 80.0 - 100.0 fL   MCH 28.1 26.0 - 34.0 pg   MCHC 33.9 30.0 - 36.0 g/dL   RDW 56.2 13.0 - 86.5 %   Platelets 189 150 - 400 K/uL   nRBC 0.0 0.0 - 0.2 %   Neutrophils Relative % 65 %   Neutro Abs 4.0 1.7 - 7.7 K/uL   Lymphocytes Relative 21 %   Lymphs Abs 1.3 0.7 - 4.0 K/uL   Monocytes Relative 7 %    Monocytes Absolute 0.4 0.1 - 1.0 K/uL   Eosinophils Relative 5 %   Eosinophils Absolute 0.3 0.0 - 0.5 K/uL   Basophils Relative 1 %   Basophils Absolute 0.0 0.0 - 0.1 K/uL   Immature Granulocytes 1 %   Abs Immature Granulocytes 0.03 0.00 - 0.07 K/uL  Basic metabolic panel  Result Value Ref Range   Sodium 139 135 - 145 mmol/L   Potassium 4.6 3.5 - 5.1 mmol/L   Chloride 105 98 - 111 mmol/L   CO2 29 22 - 32 mmol/L   Glucose, Bld 135 (H) 70 - 99 mg/dL   BUN 9 6 - 20 mg/dL   Creatinine, Ser 7.84 0.61 - 1.24 mg/dL   Calcium 8.7 (L) 8.9 - 10.3 mg/dL   GFR, Estimated >69 >62 mL/min   Anion gap 5 5 - 15  CBC  Result Value Ref Range   WBC 9.6 4.0 - 10.5 K/uL   RBC 4.92 4.22 - 5.81 MIL/uL   Hemoglobin 13.8 13.0 - 17.0 g/dL   HCT 95.2 84.1 - 32.4 %   MCV 83.1 80.0 - 100.0 fL   MCH 28.0 26.0 - 34.0 pg   MCHC 33.7 30.0 - 36.0 g/dL   RDW 40.1 02.7 - 25.3 %   Platelets 194 150 - 400 K/uL   nRBC 0.0 0.0 - 0.2 %  HIV Antibody (routine testing w rflx)  Result Value Ref Range   HIV Screen 4th Generation wRfx Non Reactive Non Reactive  CBC  Result Value Ref Range   WBC 8.1 4.0 - 10.5 K/uL   RBC 4.78 4.22 - 5.81 MIL/uL   Hemoglobin 13.3 13.0 - 17.0 g/dL   HCT 66.4 40.3 -  52.0 %   MCV 82.6 80.0 - 100.0 fL   MCH 27.8 26.0 - 34.0 pg   MCHC 33.7 30.0 - 36.0 g/dL   RDW 16.1 09.6 - 04.5 %   Platelets 173 150 - 400 K/uL   nRBC 0.0 0.0 - 0.2 %  CBC  Result Value Ref Range   WBC 8.5 4.0 - 10.5 K/uL   RBC 4.64 4.22 - 5.81 MIL/uL   Hemoglobin 12.9 (L) 13.0 - 17.0 g/dL   HCT 40.9 (L) 81.1 - 91.4 %   MCV 83.8 80.0 - 100.0 fL   MCH 27.8 26.0 - 34.0 pg   MCHC 33.2 30.0 - 36.0 g/dL   RDW 78.2 95.6 - 21.3 %   Platelets 177 150 - 400 K/uL   nRBC 0.0 0.0 - 0.2 %    Assessment & Plan:   Problem List Items Addressed This Visit     Lymphadenopathy    Chronic extensive LAD, present since at least 2006, progressive now effacing L kidney.  Multiple biopsy attempts have been indeterminate  including recent L kidney biopsy.  Has seen [facility] surgery, planning to establish with uro and heme/onc.       Anxiety with limited-symptom attacks    Worsened with recent medical issues. Refilled lorazepam today. Consider daily mood medication.       Relevant Medications   LORazepam (ATIVAN) 0.5 MG tablet   Edema of left lower leg    Obstructive from intraabdominal-pelvic process.  Continue thigh high compression stocking use - Rx provided today for grade 2 compression to take to local medical supply store.       Gross hematuria    After recent renal biopsy with acute urinary retention due to bladder thrombus, largely improved after recent hospitalization for bladder irrigation, seeing [facility] urology with f/u planned tomorrow.       Acute urinary retention - Primary    Due to bleeding/clotting after recent renal biopsy.  This is largely resolved after hospitalization for bladder irrigation      Intraabdominal mass    Progression of intra-abdominal soft tissue mass/lymphadenopathy involving L>R kidneys, abdominal arteries. Now with atrophic L kidney on imaging. GFR remains >60. Pending appts with [facility] urology and heme/onc.       Elevated blood pressure reading in office without diagnosis of hypertension    Has had several elevated blood pressure readings recently at different provider visits (151/100, 132/97) although today overall stable. Given concern over kidney disease on imaging, recommend tight BP control. Will start low dose amlodipine 2.5mg  daily, advised monitoring BP at home and hold if develops hypotension or hypotensive symptoms.         Meds ordered this encounter  Medications   LORazepam (ATIVAN) 0.5 MG tablet    Sig: Take 0.5-1 tablets (0.25-0.5 mg total) by mouth 2 (two) times daily as needed for anxiety.    Dispense:  30 tablet    Refill:  0    This request is for a new prescription for a controlled substance as required by Federal/State law.   amLODipine  (NORVASC) 2.5 MG tablet    Sig: Take 1 tablet (2.5 mg total) by mouth daily.    Dispense:  30 tablet    Refill:  6    No orders of the defined types were placed in this encounter.   Patient Instructions  Go to durable medical equipment store for thigh high compression stockings.  Ativan refilled.  Start amlodipine 2.5mg  daily for blood pressure.  FMLA forms filled out.   Follow up plan: Return if symptoms worsen or fail to improve.  Eustaquio BoydenJavier Milus Fritze, MD

## 2022-10-10 ENCOUNTER — Telehealth: Payer: Self-pay

## 2022-10-10 ENCOUNTER — Other Ambulatory Visit: Payer: Self-pay | Admitting: Urology

## 2022-10-10 MED ORDER — PHENAZOPYRIDINE HCL 200 MG PO TABS: 200.0000 mg | ORAL_TABLET | Freq: Three times a day (TID) | ORAL | 1 refills | Status: DC | PRN

## 2022-10-10 MED ORDER — TAMSULOSIN HCL 0.4 MG PO CAPS: 0.4000 mg | ORAL_CAPSULE | Freq: Every day | ORAL | 1 refills | Status: DC

## 2022-10-10 NOTE — Telephone Encounter (Signed)
Patient had a cath removed on 10/06/22. He states that when he got home he did pass a blood clot. He is worried about his weak stream and still having some burning with urination. Patient did take AZO for 2 days which helped with the flow and the burning per patient. Patient has a follow up scheduled for 10/13/22.   Please advise.

## 2022-10-10 NOTE — Telephone Encounter (Signed)
  Type of forms received: FMLA  Routed [id number] Pool  Paperwork received by : Lesly Rubenstein   Individual made aware of 3-5 business day turn around (Y/N): Y  Form completed and patient made aware of charges(Y/N): Y   Faxed to :   Form location: Place in PCP folder

## 2022-10-11 NOTE — Telephone Encounter (Signed)
Placed form in Dr. G's box.  

## 2022-10-12 ENCOUNTER — Telehealth: Payer: Self-pay | Admitting: Family Medicine

## 2022-10-12 ENCOUNTER — Ambulatory Visit: Payer: 59 | Admitting: Family Medicine

## 2022-10-12 ENCOUNTER — Encounter: Payer: Self-pay | Admitting: Family Medicine

## 2022-10-12 VITALS — BP 136/82 | HR 88 | Temp 97.6°F | Ht 75.0 in | Wt 219.5 lb

## 2022-10-12 DIAGNOSIS — R1900 Intra-abdominal and pelvic swelling, mass and lump, unspecified site: Secondary | ICD-10-CM

## 2022-10-12 DIAGNOSIS — R338 Other retention of urine: Secondary | ICD-10-CM

## 2022-10-12 DIAGNOSIS — R310 Gross hematuria: Secondary | ICD-10-CM

## 2022-10-12 DIAGNOSIS — R600 Localized edema: Secondary | ICD-10-CM | POA: Diagnosis not present

## 2022-10-12 DIAGNOSIS — R030 Elevated blood-pressure reading, without diagnosis of hypertension: Secondary | ICD-10-CM

## 2022-10-12 DIAGNOSIS — R591 Generalized enlarged lymph nodes: Secondary | ICD-10-CM | POA: Diagnosis not present

## 2022-10-12 DIAGNOSIS — F418 Other specified anxiety disorders: Secondary | ICD-10-CM

## 2022-10-12 MED ORDER — AMLODIPINE BESYLATE 2.5 MG PO TABS
2.5000 mg | ORAL_TABLET | Freq: Every day | ORAL | 6 refills | Status: DC
Start: 2022-10-12 — End: 2022-12-14

## 2022-10-12 MED ORDER — LORAZEPAM 0.5 MG PO TABS: 0.2500 mg | ORAL_TABLET | Freq: Two times a day (BID) | ORAL | 0 refills | Status: DC | PRN

## 2022-10-12 NOTE — Telephone Encounter (Signed)
Sander Nephew, NT  Nurse Tech   Telephone Encounter Signed   Creation Time: 10/10/2022  4:13 PM   Signed       Type of forms received: FMLA   Routed [id number] Pool   Paperwork received by : Lesly Rubenstein     Individual made aware of 3-5 business day turn around (Y/N): Y   Form completed and patient made aware of charges(Y/N): Y     Faxed to :    Form location: Place in PCP folder

## 2022-10-12 NOTE — Telephone Encounter (Addendum)
FMLA forms filled out at [employer] and be faxed today.

## 2022-10-12 NOTE — Telephone Encounter (Signed)
This was attached to urology note - new phone note encounter started

## 2022-10-12 NOTE — Patient Instructions (Addendum)
Go to durable medical equipment store for thigh high compression stockings.  Ativan refilled.  Start amlodipine 2.5mg  daily for blood pressure.  FMLA forms filled out.

## 2022-10-13 ENCOUNTER — Ambulatory Visit: Payer: 59 | Admitting: Urology

## 2022-10-13 ENCOUNTER — Encounter: Payer: Self-pay | Admitting: Urology

## 2022-10-13 VITALS — BP 127/95 | HR 94

## 2022-10-13 DIAGNOSIS — R310 Gross hematuria: Secondary | ICD-10-CM | POA: Diagnosis not present

## 2022-10-13 DIAGNOSIS — R339 Retention of urine, unspecified: Secondary | ICD-10-CM

## 2022-10-13 DIAGNOSIS — I10 Essential (primary) hypertension: Secondary | ICD-10-CM | POA: Insufficient documentation

## 2022-10-13 DIAGNOSIS — R030 Elevated blood-pressure reading, without diagnosis of hypertension: Secondary | ICD-10-CM | POA: Insufficient documentation

## 2022-10-13 DIAGNOSIS — R8290 Unspecified abnormal findings in urine: Secondary | ICD-10-CM | POA: Diagnosis not present

## 2022-10-13 DIAGNOSIS — N2889 Other specified disorders of kidney and ureter: Secondary | ICD-10-CM | POA: Diagnosis not present

## 2022-10-13 DIAGNOSIS — Z87898 Personal history of other specified conditions: Secondary | ICD-10-CM

## 2022-10-13 LAB — MICROSCOPIC EXAMINATION
Renal Epithel, UA: NONE SEEN /hpf
Trichomonas, UA: NONE SEEN
Yeast, UA: NONE SEEN

## 2022-10-13 LAB — URINALYSIS, ROUTINE W REFLEX MICROSCOPIC
Bilirubin, UA: POSITIVE — AB
Leukocytes,UA: NEGATIVE
Nitrite, UA: POSITIVE — AB
Specific Gravity, UA: 1.03 (ref 1.005–1.030)
Urobilinogen, Ur: 1 mg/dL (ref 0.2–1.0)
pH, UA: 5 (ref 5.0–7.5)

## 2022-10-13 LAB — BLADDER SCAN AMB NON-IMAGING

## 2022-10-13 MED ORDER — SULFAMETHOXAZOLE-TRIMETHOPRIM 800-160 MG PO TABS
1.0000 | ORAL_TABLET | Freq: Two times a day (BID) | ORAL | 0 refills | Status: AC
Start: 2022-10-13 — End: 2022-10-18

## 2022-10-13 NOTE — Assessment & Plan Note (Addendum)
Has had several elevated blood pressure readings recently at different provider visits (151/100, 132/97) although today overall stable. Given concern over kidney disease on imaging, recommend tight BP control. Will start low dose amlodipine 2.5mg  daily, advised monitoring BP at home and hold if develops hypotension or hypotensive symptoms.

## 2022-10-13 NOTE — Assessment & Plan Note (Addendum)
Chronic extensive LAD, present since at least 2006, progressive now effacing L kidney.  Multiple biopsy attempts have been indeterminate including recent L kidney biopsy.  Has seen [facility] surgery, planning to establish with uro and heme/onc.

## 2022-10-13 NOTE — Assessment & Plan Note (Addendum)
Worsened with recent medical issues. Refilled lorazepam today. Consider daily mood medication.

## 2022-10-13 NOTE — Assessment & Plan Note (Signed)
After recent renal biopsy with acute urinary retention due to bladder thrombus, largely improved after recent hospitalization for bladder irrigation, seeing [facility] urology with f/u planned tomorrow.
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
## 2022-10-13 NOTE — Assessment & Plan Note (Addendum)
Obstructive from intraabdominal-pelvic process.  Continue thigh high compression stocking use - Rx provided today for grade 2 compression to take to local medical supply store.

## 2022-10-13 NOTE — Telephone Encounter (Signed)
Faxed FMLA ppw to [facility] FML[employer] Employees/FMLA Specialist at [phone].  [Made copy to be scanned. Mailed original form to pt.]

## 2022-10-13 NOTE — Assessment & Plan Note (Signed)
Due to bleeding/clotting after recent renal biopsy.  This is largely resolved after hospitalization for bladder irrigation

## 2022-10-13 NOTE — Assessment & Plan Note (Addendum)
Progression of intra-abdominal soft tissue mass/lymphadenopathy involving L>R kidneys, abdominal arteries. Now with atrophic L kidney on imaging. GFR remains >60. Pending appts with [facility] urology and heme/onc.

## 2022-10-14 ENCOUNTER — Telehealth: Payer: Self-pay | Admitting: Family Medicine

## 2022-10-14 LAB — URINE CULTURE: Organism ID, Bacteria: NO GROWTH

## 2022-10-14 NOTE — Telephone Encounter (Signed)
Rx written and in Lisa's box.  

## 2022-10-14 NOTE — Telephone Encounter (Signed)
Patient was written a rx for a thigh high 20to30 compression stocking.The size was wrong ,and now they are needing a new rx for a 30to40 size sent over to [facility]. Fax number:[phone]

## 2022-10-14 NOTE — Telephone Encounter (Signed)
Faxed rx to [facility] at [phone].

## 2022-10-17 ENCOUNTER — Telehealth: Payer: Self-pay

## 2022-10-17 NOTE — Telephone Encounter (Signed)
Notified pt as advised, pt verbalized understanding.  

## 2022-10-17 NOTE — Telephone Encounter (Signed)
-----   Message from Milderd Meager, MD sent at 10/17/2022  8:59 AM EDT ----- Please notify patient that his urine culture did not show evidence of a UTI. OK to complete 5 days of Bactrim as prescribed.  No additional antibiotics are needed at this time.

## 2022-11-01 ENCOUNTER — Other Ambulatory Visit: Payer: Self-pay | Admitting: Urology

## 2022-11-10 ENCOUNTER — Telehealth: Payer: Self-pay | Admitting: Family Medicine

## 2022-11-10 NOTE — Telephone Encounter (Signed)
Looks like all of CT scans inquiring about were done at [facility] in [place].  Lvm asking Maralyn Sago of [facility] to call back. Need to relay info above.

## 2022-11-10 NOTE — Telephone Encounter (Signed)
Maralyn Sago from [facility] called to see if we could tell her the locations where CT scans were done on patient for dates of 03/16/2011,03/30/2011, an 03/13/2012? She see in his chart that they were done but not the location of where.

## 2022-11-11 ENCOUNTER — Encounter (HOSPITAL_BASED_OUTPATIENT_CLINIC_OR_DEPARTMENT_OTHER): Payer: Self-pay | Admitting: Internal Medicine

## 2022-11-11 ENCOUNTER — Other Ambulatory Visit: Payer: Self-pay | Admitting: Internal Medicine

## 2022-11-11 DIAGNOSIS — R591 Generalized enlarged lymph nodes: Secondary | ICD-10-CM

## 2022-11-11 NOTE — Telephone Encounter (Signed)
Noted  

## 2022-11-11 NOTE — Telephone Encounter (Signed)
Maralyn Sago called back in and relayed message to her. Thank you!

## 2022-11-13 ENCOUNTER — Encounter: Payer: Self-pay | Admitting: Urology

## 2022-11-16 ENCOUNTER — Ambulatory Visit
Admission: RE | Admit: 2022-11-16 | Discharge: 2022-11-16 | Disposition: A | Discharge status: Home / Self Care | Payer: 59 | Source: Ambulatory Visit | Attending: Internal Medicine | Admitting: Internal Medicine

## 2022-11-16 DIAGNOSIS — R591 Generalized enlarged lymph nodes: Secondary | ICD-10-CM | POA: Insufficient documentation

## 2022-11-16 LAB — GLUCOSE, CAPILLARY: Glucose-Capillary: 96 mg/dL (ref 70–99)

## 2022-11-16 MED ORDER — FLUDEOXYGLUCOSE F - 18 (FDG) INJECTION
11.3000 | Freq: Once | INTRAVENOUS | Status: AC | PRN
  Administered 2022-11-16: 12.46 via INTRAVENOUS

## 2022-11-17 ENCOUNTER — Encounter: Payer: Self-pay | Admitting: Urology

## 2022-11-17 ENCOUNTER — Ambulatory Visit: Payer: 59 | Admitting: Urology

## 2022-11-17 VITALS — BP 160/111 | HR 86 | Ht 75.0 in | Wt 226.0 lb

## 2022-11-17 DIAGNOSIS — R339 Retention of urine, unspecified: Secondary | ICD-10-CM | POA: Diagnosis not present

## 2022-11-17 DIAGNOSIS — R3912 Poor urinary stream: Secondary | ICD-10-CM | POA: Diagnosis not present

## 2022-11-17 DIAGNOSIS — Z87898 Personal history of other specified conditions: Secondary | ICD-10-CM

## 2022-11-17 LAB — URINALYSIS, ROUTINE W REFLEX MICROSCOPIC
Bilirubin, UA: NEGATIVE
Glucose, UA: NEGATIVE
Ketones, UA: NEGATIVE
Leukocytes,UA: NEGATIVE
Nitrite, UA: NEGATIVE
Protein,UA: NEGATIVE
RBC, UA: NEGATIVE
Specific Gravity, UA: 1.025 (ref 1.005–1.030)
Urobilinogen, Ur: 0.2 mg/dL (ref 0.2–1.0)
pH, UA: 5.5 (ref 5.0–7.5)

## 2022-11-17 LAB — BLADDER SCAN AMB NON-IMAGING
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
## 2022-11-28 ENCOUNTER — Telehealth: Payer: Self-pay | Admitting: Family Medicine

## 2022-11-28 NOTE — Telephone Encounter (Signed)
Pt called asking if he needs to keep his lab appt for his cpe labs on 6/10 or cancel? Pt states Dr. Reece Agar referred him to a Hematologist in [facility] & he has had a lot of blood work done there. Pt asked if we could get those lab results? Call back # [phone]

## 2022-12-04 NOTE — Telephone Encounter (Addendum)
Ok to cancel Monday appt, come fasting for next visit and we can get any needed labs at that time.  I can see [facility] labs.

## 2022-12-05 ENCOUNTER — Other Ambulatory Visit: Payer: 59

## 2022-12-05 NOTE — Telephone Encounter (Signed)
Spoke with pt relaying Dr. Timoteo Expose message. Pt verbalizes understanding stating he already canceled today's lab visit.

## 2022-12-14 ENCOUNTER — Ambulatory Visit (INDEPENDENT_AMBULATORY_CARE_PROVIDER_SITE_OTHER): Payer: 59 | Admitting: Family Medicine

## 2022-12-14 ENCOUNTER — Encounter: Payer: Self-pay | Admitting: Family Medicine

## 2022-12-14 VITALS — BP 146/96 | HR 83 | Temp 97.9°F | Ht 74.5 in | Wt 225.0 lb

## 2022-12-14 DIAGNOSIS — I10 Essential (primary) hypertension: Secondary | ICD-10-CM

## 2022-12-14 DIAGNOSIS — L700 Acne vulgaris: Secondary | ICD-10-CM

## 2022-12-14 DIAGNOSIS — Z23 Encounter for immunization: Secondary | ICD-10-CM

## 2022-12-14 DIAGNOSIS — R1900 Intra-abdominal and pelvic swelling, mass and lump, unspecified site: Secondary | ICD-10-CM

## 2022-12-14 DIAGNOSIS — Z87891 Personal history of nicotine dependence: Secondary | ICD-10-CM

## 2022-12-14 DIAGNOSIS — E538 Deficiency of other specified B group vitamins: Secondary | ICD-10-CM | POA: Diagnosis not present

## 2022-12-14 DIAGNOSIS — Z0000 Encounter for general adult medical examination without abnormal findings: Secondary | ICD-10-CM

## 2022-12-14 DIAGNOSIS — Z13220 Encounter for screening for lipoid disorders: Secondary | ICD-10-CM

## 2022-12-14 DIAGNOSIS — R600 Localized edema: Secondary | ICD-10-CM

## 2022-12-14 DIAGNOSIS — L719 Rosacea, unspecified: Secondary | ICD-10-CM

## 2022-12-14 DIAGNOSIS — F418 Other specified anxiety disorders: Secondary | ICD-10-CM

## 2022-12-14 DIAGNOSIS — Z125 Encounter for screening for malignant neoplasm of prostate: Secondary | ICD-10-CM

## 2022-12-14 DIAGNOSIS — E785 Hyperlipidemia, unspecified: Secondary | ICD-10-CM

## 2022-12-14 DIAGNOSIS — E559 Vitamin D deficiency, unspecified: Secondary | ICD-10-CM

## 2022-12-14 DIAGNOSIS — N5089 Other specified disorders of the male genital organs: Secondary | ICD-10-CM

## 2022-12-14 DIAGNOSIS — R591 Generalized enlarged lymph nodes: Secondary | ICD-10-CM

## 2022-12-14 LAB — LIPID PANEL
Cholesterol: 191 mg/dL (ref 0–200)
HDL: 27.2 mg/dL — ABNORMAL LOW (ref >39.00)
NonHDL: 163.43
Total CHOL/HDL Ratio: 7
Triglycerides: 290 mg/dL — ABNORMAL HIGH (ref 0.0–149.0)
VLDL: 58 mg/dL — ABNORMAL HIGH (ref 0.0–40.0)

## 2022-12-14 LAB — VITAMIN B12: Vitamin B-12: 455 pg/mL (ref 211–911)

## 2022-12-14 LAB — BASIC METABOLIC PANEL
BUN: 13 mg/dL (ref 6–23)
CO2: 27 mEq/L (ref 19–32)
Calcium: 9 mg/dL (ref 8.4–10.5)
Chloride: 104 mEq/L (ref 96–112)
Creatinine, Ser: 1.1 mg/dL (ref 0.40–1.50)
GFR: 76.85 mL/min (ref >60.00)
Glucose, Bld: 125 mg/dL — ABNORMAL HIGH (ref 70–99)
Potassium: 4.2 mEq/L (ref 3.5–5.1)
Sodium: 140 mEq/L (ref 135–145)

## 2022-12-14 LAB — LDL CHOLESTEROL, DIRECT: Direct LDL: 129 mg/dL

## 2022-12-14 LAB — PSA: PSA: 0.34 ng/mL (ref 0.10–4.00)

## 2022-12-14 LAB — VITAMIN D 25 HYDROXY (VIT D DEFICIENCY, FRACTURES): VITD: 33.97 ng/mL (ref 30.00–100.00)

## 2022-12-14 MED ORDER — METRONIDAZOLE 0.75 % EX GEL: Freq: Two times a day (BID) | CUTANEOUS | 0 refills | Status: DC

## 2022-12-14 MED ORDER — AMLODIPINE BESYLATE 5 MG PO TABS: 5.0000 mg | ORAL_TABLET | Freq: Every day | ORAL | 4 refills | Status: DC
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
## 2022-12-14 NOTE — Patient Instructions (Addendum)
First shingles shot today. Return in 2-6 months to complete shingles 2nd shot.  Labs today  Start wearing physical/mineral sunscreen.  Increase amlodipine to 5mg  daily.  Try metrogel for outbreak on face/rosacea.  Follow up with [facility] urology for appointment.  Return as needed or in 6 months for follow up visit.

## 2022-12-15 ENCOUNTER — Encounter: Payer: Self-pay | Admitting: Family Medicine

## 2022-12-15 DIAGNOSIS — L719 Rosacea, unspecified: Secondary | ICD-10-CM | POA: Insufficient documentation

## 2022-12-15 NOTE — Assessment & Plan Note (Signed)
Ongoing in setting of current medical issues - continue lorazepam PRN.

## 2022-12-15 NOTE — Assessment & Plan Note (Signed)
Chronic, overall stable period, managed with compression stocking, currently using 30-33mmHg compression.

## 2022-12-15 NOTE — Assessment & Plan Note (Signed)
Chronic, update FLP off medication. The 10-year ASCVD risk score (Arnett DK, et al., 2019) is: 9.7%   Values used to calculate the score:     Age: 52 years     Sex: Male     Is Non-Hispanic African American: No     Diabetic: No     Tobacco smoker: No     Systolic Blood Pressure: 146 mmHg     Is BP treated: No     HDL Cholesterol: 27.2 mg/dL     Total Cholesterol: 191 mg/dL

## 2022-12-15 NOTE — Assessment & Plan Note (Signed)
BP improved on low dose amlodipine but remains above goal. Will increase to 5mg  daily and monitor at home. RTC 6 mo HTN f/u visit.

## 2022-12-15 NOTE — Assessment & Plan Note (Signed)
Newly noted over last few weeks - exam suspicious for large R hydrocele and smaller varicoceles. Reassurance provided, did recommend re-evaluation at upcoming [facility] urology appt.

## 2022-12-15 NOTE — Assessment & Plan Note (Signed)
Update levels on b12 1000mcg daily.  °

## 2022-12-15 NOTE — Assessment & Plan Note (Addendum)
Diagnosis by derm 07/2021 treated with oral doxycycline course.  Notes exacerbation of this - will Rx metrogel 1% topically daily, update with effect. Discussed physical/mineral sunscreen use

## 2022-12-15 NOTE — Assessment & Plan Note (Signed)
Still of unclear etiology despite comprehensive evaluation including repeat inguinal lymph node biopsy 10/2022. Appreciate [facility] team care. Planning to see [facility] urologist Dr Angela Adam to discuss retroperitoneal node excisional biopsy vs left nephrectomy.

## 2022-12-15 NOTE — Assessment & Plan Note (Signed)
Preventative protocols reviewed and updated unless pt declined. Discussed healthy diet and lifestyle.  

## 2022-12-15 NOTE — Assessment & Plan Note (Signed)
Chronic, longstanding issue present since teenager, now with progression as per below.  Previous biopsies inconclusive (2006, 2007, 2017).

## 2022-12-15 NOTE — Assessment & Plan Note (Signed)
Update vit D on 1000 international units daily.

## 2022-12-15 NOTE — Assessment & Plan Note (Addendum)
Remains abstinent.  Discussed lung cancer screen - will defer while he completes current oncology workup. Recent PET without significant chest activity.

## 2022-12-15 NOTE — Assessment & Plan Note (Signed)
Trial topical metrogel for rosacea component.

## 2022-12-26 ENCOUNTER — Other Ambulatory Visit (HOSPITAL_COMMUNITY): Payer: Self-pay | Admitting: Urology

## 2022-12-26 DIAGNOSIS — N2889 Other specified disorders of kidney and ureter: Secondary | ICD-10-CM

## 2022-12-26 DIAGNOSIS — N433 Hydrocele, unspecified: Secondary | ICD-10-CM

## 2023-01-03 ENCOUNTER — Ambulatory Visit (HOSPITAL_COMMUNITY)
Admission: RE | Admit: 2023-01-03 | Discharge: 2023-01-03 | Disposition: A | Discharge status: Home / Self Care | Payer: 59 | Source: Ambulatory Visit | Attending: Urology | Admitting: Urology

## 2023-01-03 DIAGNOSIS — N433 Hydrocele, unspecified: Secondary | ICD-10-CM | POA: Diagnosis present

## 2023-01-06 ENCOUNTER — Encounter (HOSPITAL_COMMUNITY)
Admission: RE | Admit: 2023-01-06 | Discharge: 2023-01-06 | Disposition: A | Discharge status: Home / Self Care | Payer: 59 | Source: Ambulatory Visit | Attending: Urology | Admitting: Urology

## 2023-01-06 DIAGNOSIS — N2889 Other specified disorders of kidney and ureter: Secondary | ICD-10-CM | POA: Insufficient documentation

## 2023-01-06 MED ORDER — FUROSEMIDE 10 MG/ML IJ SOLN
40.0000 mg | Freq: Once | INTRAMUSCULAR | Status: AC
  Administered 2023-01-06: 40 mg via INTRAVENOUS

## 2023-01-06 MED ORDER — TECHNETIUM TC 99M MERTIATIDE
5.3000 | Freq: Once | INTRAVENOUS | Status: AC | PRN
  Administered 2023-01-06: 5.3 via INTRAVENOUS

## 2023-01-06 MED ORDER — FUROSEMIDE 10 MG/ML IJ SOLN
INTRAMUSCULAR | Status: AC
  Filled 2023-01-06: qty 4

## 2023-01-16 ENCOUNTER — Telehealth: Payer: Self-pay

## 2023-01-16 NOTE — Transitions of Care (Post Inpatient/ED Visit) (Signed)
   01/16/2023  Name: Keith Bishop MRN: [id number] DOB: 12-Jul-1970  Today's TOC FU Call Status: Today's TOC FU Call Status:: Successful TOC FU Call Competed TOC FU Call Complete Date: 01/16/23  Transition Care Management Follow-up Telephone Call Date of Discharge: 01/14/23 Discharge Facility: Other ([facility]) Name of Other ([facility]) Discharge Facility: [facility] Type of Discharge: Inpatient Admission Primary Inpatient Discharge Diagnosis:: Renal mass How have you been since you were released from the hospital?: Better Any questions or concerns?: No  Items Reviewed: Did you receive and understand the discharge instructions provided?: Yes Medications obtained,verified, and reconciled?: Yes (Medications Reviewed) Any new allergies since your discharge?: No Dietary orders reviewed?: Yes Do you have support at home?: No  Medications Reviewed Today: Medications Reviewed Today     Reviewed by Merleen Nicely, LPN (Licensed Practical Nurse) on 01/16/23 at 1245  Med List Status: <None>   Medication Order Taking? Sig Documenting Provider Last Dose Status Informant  acetaminophen (TYLENOL) 500 MG tablet [id number] Yes Take 500 mg by mouth every 6 (six) hours as needed. [provider] Taking Active   amLODipine (NORVASC) 5 MG tablet [id number] Yes Take 1 tablet (5 mg total) by mouth daily. Eustaquio Boyden, MD Taking Active   Cholecalciferol (VITAMIN D3) 25 MCG (1000 UT) CAPS [id number] Yes Take 1 capsule (1,000 Units total) by mouth daily. Eustaquio Boyden, MD Taking Active   cyanocobalamin (VITAMIN B12) 1000 MCG tablet [id number] Yes Take 1 tablet (1,000 mcg total) by mouth daily. Eustaquio Boyden, MD Taking Active   ibuprofen (ADVIL) 200 MG tablet [id number] Yes Take 200 mg by mouth every 6 (six) hours as needed. [provider] Taking Active   LORazepam (ATIVAN) 0.5 MG tablet [id number] Yes Take 0.5-1 tablets (0.25-0.5 mg total) by mouth 2 (two) times daily as  needed for anxiety. Eustaquio Boyden, MD Taking Active   Melatonin 10 MG SUBL [id number] Yes Place under the tongue at bedtime. As needed [provider] Taking Active Self  metroNIDAZOLE (METROGEL) 0.75 % gel [id number] Yes Apply topically 2 (two) times daily. To affected area on face Eustaquio Boyden, MD Taking Active   Multiple Vitamins-Iron (MULTIVITAMIN PLUS IRON ADULT PO) [id number] Yes Take by mouth daily. [provider] Taking Active Self  phenazopyridine (PYRIDIUM) 200 MG tablet [id number] Yes Take 1 tablet (200 mg total) by mouth 3 (three) times daily as needed for pain. Stoneking, Danford Bad., MD Taking Active   tamsulosin [facility]) 0.4 MG CAPS capsule [id number] Yes TAKE 1 CAPSULE BY MOUTH EVERY DAY Stoneking, Danford Bad., MD Taking Active             Home Care and Equipment/Supplies: Were Home Health Services Ordered?: No Any new equipment or medical supplies ordered?: No  Functional Questionnaire: Do you need assistance with bathing/showering or dressing?: No Do you need assistance with meal preparation?: No Do you need assistance with eating?: No Do you have difficulty maintaining continence: No Do you need assistance with getting out of bed/getting out of a chair/moving?: No Do you have difficulty managing or taking your medications?: No  Follow up appointments reviewed: PCP Follow-up appointment confirmed?: No MD Provider Line Number:[phone] Given: Yes Specialist Hospital Follow-up appointment confirmed?: No Reason Specialist Follow-Up Not Confirmed: Patient has Specialist Provider Number and will Call for Appointment Do you need transportation to your follow-up appointment?: No Do you understand care options if your condition(s) worsen?: Yes-patient verbalized understanding    SIGNATURE  Woodfin Ganja LPN [facility] Nurse Health Advisor Direct Dial [phone]

## 2023-03-08 ENCOUNTER — Other Ambulatory Visit: Payer: Self-pay | Admitting: Urology

## 2023-04-27 ENCOUNTER — Other Ambulatory Visit: Payer: Self-pay | Admitting: Family Medicine

## 2023-04-27 NOTE — Telephone Encounter (Signed)
Last OV: 12/14/2022 Pending OV: 06/16/2023 Medication: Lorazepam 0.5mg  Directions: Take 0.5-1 tablet BID prn Last Refill: 10/12/2022 Qty: #30 with 0 refills

## 2023-05-02 MED ORDER — LORAZEPAM 0.5 MG PO TABS: 0.2500 mg | ORAL_TABLET | Freq: Two times a day (BID) | ORAL | 0 refills | Status: DC | PRN

## 2023-05-02 NOTE — Telephone Encounter (Signed)
ERx 

## 2023-05-19 ENCOUNTER — Encounter: Payer: Self-pay | Admitting: Family Medicine

## 2023-05-19 ENCOUNTER — Ambulatory Visit: Payer: 59 | Admitting: Family Medicine

## 2023-05-19 VITALS — BP 136/90 | HR 100 | Temp 98.3°F | Ht 74.5 in | Wt 215.4 lb

## 2023-05-19 DIAGNOSIS — R591 Generalized enlarged lymph nodes: Secondary | ICD-10-CM

## 2023-05-19 DIAGNOSIS — N35014 Post-traumatic urethral stricture, male, unspecified: Secondary | ICD-10-CM | POA: Diagnosis not present

## 2023-05-19 DIAGNOSIS — R252 Cramp and spasm: Secondary | ICD-10-CM | POA: Diagnosis not present

## 2023-05-19 DIAGNOSIS — K682 Retroperitoneal fibrosis: Secondary | ICD-10-CM

## 2023-05-19 DIAGNOSIS — F418 Other specified anxiety disorders: Secondary | ICD-10-CM | POA: Diagnosis not present

## 2023-05-19 DIAGNOSIS — R002 Palpitations: Secondary | ICD-10-CM

## 2023-05-19 DIAGNOSIS — R61 Generalized hyperhidrosis: Secondary | ICD-10-CM

## 2023-05-19 DIAGNOSIS — I10 Essential (primary) hypertension: Secondary | ICD-10-CM

## 2023-05-19 DIAGNOSIS — R9431 Abnormal electrocardiogram [ECG] [EKG]: Secondary | ICD-10-CM

## 2023-05-19 DIAGNOSIS — T887XXA Unspecified adverse effect of drug or medicament, initial encounter: Secondary | ICD-10-CM

## 2023-05-19 LAB — POC URINALSYSI DIPSTICK (AUTOMATED)
Bilirubin, UA: NEGATIVE
Blood, UA: NEGATIVE
Glucose, UA: NEGATIVE
Ketones, UA: NEGATIVE
Leukocytes, UA: NEGATIVE
Nitrite, UA: NEGATIVE
Protein, UA: POSITIVE — AB
Spec Grav, UA: >=1.03 — AB (ref 1.010–1.025)
Urobilinogen, UA: 0.2 U/dL
pH, UA: 5 (ref 5.0–8.0)

## 2023-05-19 MED ORDER — ESCITALOPRAM OXALATE 5 MG PO TABS: 5.0000 mg | ORAL_TABLET | Freq: Every day | ORAL | 6 refills | Status: DC

## 2023-05-19 NOTE — Patient Instructions (Addendum)
EKG today  Urinalysis today  Labs today  Possibly symptoms related to new medication side effects. Pending above results, touch base with rheumatology.  Start low dose lexapro 5mg  daily.

## 2023-05-19 NOTE — Progress Notes (Unsigned)
Ph: [phone] Fax: [phone]   Patient ID: Keith Bishop, male    DOB: [birth year], 52 y.o.   MRN: [id number]  This visit was conducted in person.  BP (!) 136/90 (BP Location: Right Arm, Cuff Size: Large)   Pulse 100   Temp 98.3 F (36.8 C) (Oral)   Ht 6' 2.5" (1.892 m)   Wt 215 lb 6 oz (97.7 kg)   SpO2 98%   BMI 27.28 kg/m    CC: follow up visit  Subjective:   HPI: Keith Bishop is a 52 y.o. male presenting on 05/19/2023 for Medical Management of Chronic Issues (Wants to discuss medications. )   See prior notes for details.  From prior note: Recently found large intra-abdominal mass involving bilateral kidneys, found during workup for unilateral LLE swelling. Underwent L renal biopsy at [facility] IR department 09/2022 - inconclusive (dense hyalinized fibroconnective tissue, insufficient cells for definitive diagnosis). Established with [facility] onc (Dr Okey Dupre), most recently saw Dr Griffin Basil in f/u. Most recently s/p PET scan followed by repeat biopsy of L inguinal lymph nodes - benign. Still unclear diagnosis but unlikely to be cancer. ESR/CRP normal, IgG4 low. ctDNA testing via [facility] was negative (ctDNA tumr fraction <0.25%). Plan f/u with Dr Angela Adam [facility] urology to consider retroperitoneal node excisional biopsy vs kidney excision. Quantiferon TB test also negative.  Reviewed subsequent records from [facility] urology, rheumatology, hematology oncology.  Patient underwent robotic assisted laparoscopic left radical nephrectomy and adrenalectomy on January 13, 2023 by Dr. Angela Adam.  Left kidney pathology showed atypical lymphoid infiltrate with scattered necrotizing and nonnecrotizing granulomas, lymph node pathology showed fibrous tissue with scattered lymphoid aggregates.  He subsequently had a right hydrocele surgery on March 21, 2023 by urologist as well - feels this has recurred. He returned to see urologist Barkley Bruns NP for split stream urination - scope showed urethral stricture  planned surgery for this on Monday. Doesn't feel flomax ws very helpful.   Has established with [facility] rheumatology, last seen April 14, 2023.  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  Rule out Erdheim-Chester disease.  He received flu shot in Prevnar 20 last month, plan was to start prednisone taper and CellCept, also started on Bactrim DS Monday Wednesday Friday for PJP prophylaxis while on higher prednisone dose.  Prednisone 50mg  prolonged taper started about 1 month ago, currently on 30mg  dose for 2 wks. Also started cellcept 500mg  bid, currently on 1000mg  bid.  For the past 2 weeks he notes worsening anxiety, racing heart with loud beats, hypertension, low back pain, left arm pain, hands and calves are cramping up. Feels staying well hydrated.  Did note chest wall soreness with push ups.  No fevers/chills. No exertional dyspnea or chest tightness.  Ongoing night sweats.  Has lost sensation to urethra.  He took Azo for a period of time - last 1+ wk ago.      Relevant past medical, surgical, family and social history reviewed and updated as indicated. Interim medical history since our last visit reviewed. Allergies and medications reviewed and updated. Outpatient Medications Prior to Visit  Medication Sig Dispense Refill   acetaminophen (TYLENOL) 500 MG tablet Take 500 mg by mouth every 6 (six) hours as needed.     amLODipine (NORVASC) 5 MG tablet Take 1 tablet (5 mg total) by mouth daily. 90 tablet 4   LORazepam (ATIVAN) 0.5 MG tablet Take 0.5-1 tablets (0.25-0.5 mg total)  by mouth 2 (two) times daily as needed for anxiety. 30 tablet 0   Melatonin 10 MG SUBL Place under the tongue at bedtime. As needed     metroNIDAZOLE (METROGEL) 0.75 % gel Apply topically 2 (two) times daily. To affected area on face 45 g 0   Multiple Vitamins-Iron (MULTIVITAMIN PLUS IRON ADULT PO) Take by mouth  daily.     mycophenolate (CELLCEPT) 500 MG tablet Take 1,000 mg by mouth 2 (two) times daily.     pantoprazole (PROTONIX) 40 MG tablet Take 1 tablet by mouth daily.     ibuprofen (ADVIL) 200 MG tablet Take 200 mg by mouth every 6 (six) hours as needed.     phenazopyridine (PYRIDIUM) 200 MG tablet Take 1 tablet (200 mg total) by mouth 3 (three) times daily as needed for pain. 20 tablet 1   predniSONE (DELTASONE) 20 MG tablet Take by mouth.     sulfamethoxazole-trimethoprim (BACTRIM DS) 800-160 MG tablet Take by mouth.     tamsulosin (FLOMAX) 0.4 MG CAPS capsule TAKE 1 CAPSULE BY MOUTH EVERY DAY 90 capsule 1   predniSONE (DELTASONE) 20 MG tablet Take 1.5 tablets (30 mg total) by mouth daily with breakfast. Prolonged prednisone taper     sulfamethoxazole-trimethoprim (BACTRIM DS) 800-160 MG tablet Take 1 tablet by mouth 3 (three) times a week.     Cholecalciferol (VITAMIN D3) 25 MCG (1000 UT) CAPS Take 1 capsule (1,000 Units total) by mouth daily. 30 capsule    cyanocobalamin (VITAMIN B12) 1000 MCG tablet Take 1 tablet (1,000 mcg total) by mouth daily.     No facility-administered medications prior to visit.     Per HPI unless specifically indicated in ROS section below Review of Systems  Objective:  BP (!) 136/90 (BP Location: Right Arm, Cuff Size: Large)   Pulse 100   Temp 98.3 F (36.8 C) (Oral)   Ht 6' 2.5" (1.892 m)   Wt 215 lb 6 oz (97.7 kg)   SpO2 98%   BMI 27.28 kg/m   Wt Readings from Last 3 Encounters:  05/19/23 215 lb 6 oz (97.7 kg)  12/14/22 225 lb (102.1 kg)  11/17/22 226 lb (102.5 kg)      Physical Exam    Results for orders placed or performed in visit on 12/14/22  Lipid panel  Result Value Ref Range   Cholesterol 191 0 - 200 mg/dL   Triglycerides 846.9 (H) 0.0 - 149.0 mg/dL   HDL 62.95 (L) >28.41 mg/dL   VLDL 32.4 (H) 0.0 - 40.1 mg/dL   Total CHOL/HDL Ratio 7    NonHDL 163.43   PSA  Result Value Ref Range   PSA 0.34 0.10 - 4.00 ng/mL  Basic metabolic  panel  Result Value Ref Range   Sodium 140 135 - 145 mEq/L   Potassium 4.2 3.5 - 5.1 mEq/L   Chloride 104 96 - 112 mEq/L   CO2 27 19 - 32 mEq/L   Glucose, Bld 125 (H) 70 - 99 mg/dL   BUN 13 6 - 23 mg/dL   Creatinine, Ser 0.27 0.40 - 1.50 mg/dL   GFR 25.36 >64.40 mL/min   Calcium 9.0 8.4 - 10.5 mg/dL  Vitamin H47  Result Value Ref Range   Vitamin B-12 455 211 - 911 pg/mL  VITAMIN D 25 Hydroxy (Vit-D Deficiency, Fractures)  Result Value Ref Range   VITD 33.97 30.00 - 100.00 ng/mL  LDL cholesterol, direct  Result Value Ref Range   Direct LDL 129.0 mg/dL  05/19/2023    2:06 PM 12/14/2022    9:30 AM 10/12/2022    4:25 PM 12/06/2021    3:18 PM 01/31/2020   11:50 AM  Depression screen PHQ 2/9  Decreased Interest 2 2 2 1 2   Down, Depressed, Hopeless 3 2 1  0 2  PHQ - 2 Score 5 4 3 1 4   Altered sleeping 3 2 1 2 3   Tired, decreased energy 1 2 1 1 2   Change in appetite 3 1 0 0 1  Feeling bad or failure about yourself  2 1 2  0 3  Trouble concentrating 2 1 1  0 1  Moving slowly or fidgety/restless 3 0 0 0 2  Suicidal thoughts 0 0 0 0 0  PHQ-9 Score 19 11 8 4 16   Difficult doing work/chores Somewhat difficult Somewhat difficult Somewhat difficult Not difficult at all        05/19/2023    2:07 PM 12/14/2022    9:30 AM 10/12/2022    4:25 PM 12/06/2021    3:18 PM  GAD 7 : Generalized Anxiety Score  Nervous, Anxious, on Edge 3 2 1  0  Control/stop worrying 3 1 1 1   Worry too much - different things 3 1 2 1   Trouble relaxing 3 1 1  0  Restless 2 0 0 0  Easily annoyed or irritable 3 1 1  0  Afraid - awful might happen 3 3 3 1   Total GAD 7 Score 20 9 9 3   Anxiety Difficulty Somewhat difficult Somewhat difficult Very difficult Not difficult at all   Assessment & Plan:   Problem List Items Addressed This Visit     Anxiety with limited-symptom attacks   Relevant Medications   escitalopram (LEXAPRO) 5 MG tablet   Chronic night sweats - Primary   Relevant Orders   C-reactive protein    CBC with Differential/Platelet   Comprehensive metabolic panel   Sedimentation rate   POCT Urinalysis Dipstick (Automated)   Other Visit Diagnoses     Palpitation       Relevant Orders   EKG 12-Lead (Completed)        Meds ordered this encounter  Medications   escitalopram (LEXAPRO) 5 MG tablet    Sig: Take 1 tablet (5 mg total) by mouth daily.    Dispense:  30 tablet    Refill:  6    Orders Placed This Encounter  Procedures   C-reactive protein   CBC with Differential/Platelet   Comprehensive metabolic panel   Sedimentation rate   POCT Urinalysis Dipstick (Automated)   EKG 12-Lead    Patient Instructions  EKG today  Urinalysis today  Labs today  Possibly symptoms related to new medication side effects. Pending above results, touch base with rheumatology.  Start low dose lexapro 5mg  daily.   Follow up plan: Return if symptoms worsen or fail to improve.  Eustaquio Boyden, MD

## 2023-05-20 ENCOUNTER — Encounter: Payer: Self-pay | Admitting: Family Medicine

## 2023-05-20 DIAGNOSIS — R9431 Abnormal electrocardiogram [ECG] [EKG]: Secondary | ICD-10-CM | POA: Insufficient documentation

## 2023-05-20 DIAGNOSIS — K682 Retroperitoneal fibrosis: Secondary | ICD-10-CM | POA: Insufficient documentation

## 2023-05-20 DIAGNOSIS — T887XXA Unspecified adverse effect of drug or medicament, initial encounter: Secondary | ICD-10-CM | POA: Insufficient documentation

## 2023-05-20 DIAGNOSIS — N35919 Unspecified urethral stricture, male, unspecified site: Secondary | ICD-10-CM | POA: Insufficient documentation

## 2023-05-20 LAB — COMPREHENSIVE METABOLIC PANEL
AG Ratio: 2.3 (calc) (ref 1.0–2.5)
ALT: 34 U/L (ref 9–46)
AST: 11 U/L (ref 10–35)
Albumin: 4.3 g/dL (ref 3.6–5.1)
Alkaline phosphatase (APISO): 68 U/L (ref 35–144)
BUN: 20 mg/dL (ref 7–25)
CO2: 27 mmol/L (ref 20–32)
Calcium: 9.2 mg/dL (ref 8.6–10.3)
Chloride: 105 mmol/L (ref 98–110)
Creat: 1.25 mg/dL (ref 0.70–1.30)
Globulin: 1.9 g/dL (ref 1.9–3.7)
Glucose, Bld: 132 mg/dL — ABNORMAL HIGH (ref 65–99)
Potassium: 3.9 mmol/L (ref 3.5–5.3)
Sodium: 144 mmol/L (ref 135–146)
Total Bilirubin: 0.9 mg/dL (ref 0.2–1.2)
Total Protein: 6.2 g/dL (ref 6.1–8.1)

## 2023-05-20 LAB — CBC WITH DIFFERENTIAL/PLATELET
Absolute Lymphocytes: 3292 {cells}/uL (ref 850–3900)
Absolute Monocytes: 392 {cells}/uL (ref 200–950)
Basophils Absolute: 33 {cells}/uL (ref 0–200)
Basophils Relative: 0.3 %
Eosinophils Absolute: 142 {cells}/uL (ref 15–500)
Eosinophils Relative: 1.3 %
HCT: 47.8 % (ref 38.5–50.0)
Hemoglobin: 15.7 g/dL (ref 13.2–17.1)
MCH: 27 pg (ref 27.0–33.0)
MCHC: 32.8 g/dL (ref 32.0–36.0)
MCV: 82.1 fL (ref 80.0–100.0)
MPV: 9.8 fL (ref 7.5–12.5)
Monocytes Relative: 3.6 %
Neutro Abs: 7041 {cells}/uL (ref 1500–7800)
Neutrophils Relative %: 64.6 %
Platelets: 219 10*3/uL (ref 140–400)
RBC: 5.82 10*6/uL — ABNORMAL HIGH (ref 4.20–5.80)
RDW: 14.5 % (ref 11.0–15.0)
Total Lymphocyte: 30.2 %
WBC: 10.9 10*3/uL — ABNORMAL HIGH (ref 3.8–10.8)

## 2023-05-20 LAB — C-REACTIVE PROTEIN: CRP: <3 mg/L (ref <8.0)

## 2023-05-20 LAB — MAGNESIUM: Magnesium: 1.7 mg/dL (ref 1.5–2.5)

## 2023-05-20 LAB — SEDIMENTATION RATE: Sed Rate: 2 mm/h (ref 0–20)

## 2023-05-20 NOTE — Assessment & Plan Note (Signed)
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  Now followed by rheumatology. Just started prolonged prednisone 50mg  taper, currently on 30mg  daily.  He is also on cellcept 1000mg  BID.  Recent symptoms may be med side effects as per below.

## 2023-05-20 NOTE — Assessment & Plan Note (Deleted)
Chronic, ongoing issue.  Update labs.

## 2023-05-20 NOTE — Assessment & Plan Note (Addendum)
Chronic, longstanding issue present since teenager Previous biopsies inconclusive (2006, 2007, 2017).  Has established with rheumatology. Working diagnosis is granulomatous process ?sarcoid + infiltrative process ?primary retroperitoneal fibrosis.

## 2023-05-20 NOTE — Assessment & Plan Note (Addendum)
Newly found stricture possibly after initial foley catheter placement Notes ongoing urinary symptoms as well as decreased urethral sensation.  Update UA today - no obvious infection. Declines retrial of flomax. Has [facility] urology f/u planned for next week.

## 2023-05-20 NOTE — Assessment & Plan Note (Signed)
LAD with p mitrale ?L atrial enlargement - check echocardiogram

## 2023-05-20 NOTE — Assessment & Plan Note (Signed)
Worsening symptoms in setting of prednisone use, lorazepam wasn't too helpful.  Will start lexapro 5mg  daily , reviewed common side effects. To stop and let us know if develops any suicidality.

## 2023-05-20 NOTE — Assessment & Plan Note (Addendum)
Suspect current constellation of symptoms (worsened anxiety, racing pounding heart, elevated BP readings, as well as worsening LBP, hands and calves cramping) may be medication side effect given temporal relation of symptoms to med commencement (prednisone and/or cellcept).  Check EKG today - possible new LAD/p mitrale - will proceed with echocardiogram.  Check labs r/o Cellcept related acute inflammatory syndrome.  Start lexapro 5mg  daily for worsening anxiety.  I did recommend he touch base with rheumatology regarding

## 2023-05-20 NOTE — Assessment & Plan Note (Signed)
Mildly elevated today. Continue amlodipine 5mg  daily.

## 2023-06-10 ENCOUNTER — Other Ambulatory Visit: Payer: Self-pay | Admitting: Family Medicine

## 2023-06-12 ENCOUNTER — Ambulatory Visit: Payer: 59 | Attending: Family Medicine

## 2023-06-12 DIAGNOSIS — R9431 Abnormal electrocardiogram [ECG] [EKG]: Secondary | ICD-10-CM

## 2023-06-12 LAB — ECHOCARDIOGRAM COMPLETE
Area-P 1/2: 4.06 cm2
S' Lateral: 3.1 cm

## 2023-06-12 NOTE — Telephone Encounter (Signed)
Message from pharmacy:  REQUEST FOR 90 DAYS PRESCRIPTION.   Lexapro Last filled:  05/19/23, #30 Last OV:  05/19/23, discuss meds Next OV:  none

## 2023-06-16 ENCOUNTER — Ambulatory Visit: Payer: 59 | Admitting: Family Medicine

## 2023-07-28 ENCOUNTER — Encounter: Payer: Self-pay | Admitting: Family Medicine

## 2023-07-31 MED ORDER — DOXYCYCLINE HYCLATE 100 MG PO TABS: 100.0000 mg | ORAL_TABLET | Freq: Two times a day (BID) | ORAL | 0 refills | Status: DC

## 2023-07-31 NOTE — Addendum Note (Signed)
Addended by: Eustaquio Boyden on: 07/31/2023 11:34 AM   Modules accepted: Orders

## 2023-08-11 ENCOUNTER — Ambulatory Visit: Payer: 59 | Admitting: Family Medicine

## 2023-08-23 MED ORDER — DOXYCYCLINE HYCLATE 100 MG PO TABS: 100.0000 mg | ORAL_TABLET | Freq: Every day | ORAL | 1 refills | Status: DC

## 2023-08-23 NOTE — Addendum Note (Signed)
 Addended by: Eustaquio Boyden on: 08/23/2023 08:05 AM   Modules accepted: Orders

## 2023-09-08 ENCOUNTER — Encounter: Payer: Self-pay | Admitting: Family Medicine

## 2023-10-20 ENCOUNTER — Other Ambulatory Visit: Payer: Self-pay | Admitting: Family Medicine

## 2023-12-16 ENCOUNTER — Other Ambulatory Visit: Payer: Self-pay | Admitting: Family Medicine

## 2024-02-21 ENCOUNTER — Ambulatory Visit: Payer: Self-pay

## 2024-02-21 NOTE — Telephone Encounter (Signed)
 Appreciate Dr B seeing pt tomorrow.

## 2024-02-21 NOTE — Telephone Encounter (Signed)
 FYI Only or Action Required?: Action required by provider: request for appointment.  Patient was last seen in primary care on 05/19/2023 by Rilla Baller, MD.  Called Nurse Triage reporting Abdominal Pain.  Symptoms began several days ago.  Interventions attempted: Nothing.  Symptoms are: unchanged.  Triage Disposition: See Physician Within 24 Hours  Patient/caregiver understands and will follow disposition?: YesCopied from CRM #[id number]. Topic: Clinical - Red Word Triage >> Feb 21, 2024 11:21 AM Keith Bishop wrote: Red Word that prompted transfer to Nurse Triage: Patient is experiencing pain on the right side of his abdomen. Pain was severe late last night that it could not let him sleep. Reason for Disposition  [1] MILD pain (e.g., does not interfere with normal activities) AND [2] pain comes and goes (cramps) [3] present > 48 hours  (Exception: This same abdominal pain is a chronic symptom recurrent or ongoing AND present > 4 weeks.)  Answer Assessment - Initial Assessment Questions Worse at night. Pt has had fast food with soda last 2 nights and thinks this could be cause. BM normal.  RN advised to stay away from sodas and  no fast food until appt tomorrow.        1. LOCATION: Where does it hurt?      Right side 2. RADIATION: Does the pain shoot anywhere else? (e.g., chest, back)     denies 3. ONSET: When did the pain begin? (Minutes, hours or days ago)      2 days ago  4. SUDDEN: Gradual or sudden onset?     sudden 5. PATTERN Does the pain come and go, or is it constant?     Comes and goes 6. SEVERITY: How bad is the pain?  (e.g., Scale 1-10; mild, moderate, or severe)     1-2 7. RECURRENT SYMPTOM: Have you ever had this type of stomach pain before? If Yes, ask: When was the last time? and What happened that time?      na 8. CAUSE: What do you think is causing the stomach pain? (e.g., gallstones, recent abdominal surgery)     Not sure 9.  RELIEVING/AGGRAVATING FACTORS: What makes it better or worse? (e.g., antacids, bending or twisting motion, bowel movement)     Ibuprofen helps 10. OTHER SYMPTOMS: Do you have any other symptoms? (e.g., back pain, diarrhea, fever, urination pain, vomiting)       denies  Protocols used: Abdominal Pain - Male-A-AH

## 2024-02-22 ENCOUNTER — Ambulatory Visit (INDEPENDENT_AMBULATORY_CARE_PROVIDER_SITE_OTHER): Admitting: Family Medicine

## 2024-02-22 ENCOUNTER — Ambulatory Visit: Payer: Self-pay | Admitting: Family Medicine

## 2024-02-22 VITALS — BP 118/80 | HR 76 | Temp 98.0°F | Ht 74.5 in | Wt 221.0 lb

## 2024-02-22 DIAGNOSIS — R1011 Right upper quadrant pain: Secondary | ICD-10-CM

## 2024-02-22 DIAGNOSIS — Z905 Acquired absence of kidney: Secondary | ICD-10-CM

## 2024-02-22 DIAGNOSIS — N1330 Unspecified hydronephrosis: Secondary | ICD-10-CM

## 2024-02-22 DIAGNOSIS — N289 Disorder of kidney and ureter, unspecified: Secondary | ICD-10-CM

## 2024-02-22 LAB — COMPREHENSIVE METABOLIC PANEL WITH GFR
ALT: 18 U/L (ref 0–53)
AST: 16 U/L (ref 0–37)
Albumin: 4.3 g/dL (ref 3.5–5.2)
Alkaline Phosphatase: 89 U/L (ref 39–117)
BUN: 24 mg/dL — ABNORMAL HIGH (ref 6–23)
CO2: 30 meq/L (ref 19–32)
Calcium: 8.7 mg/dL (ref 8.4–10.5)
Chloride: 106 meq/L (ref 96–112)
Creatinine, Ser: 2.02 mg/dL — ABNORMAL HIGH (ref 0.40–1.50)
GFR: 36.75 mL/min — ABNORMAL LOW (ref >60.00)
Glucose, Bld: 91 mg/dL (ref 70–99)
Potassium: 4.6 meq/L (ref 3.5–5.1)
Sodium: 145 meq/L (ref 135–145)
Total Bilirubin: 0.6 mg/dL (ref 0.2–1.2)
Total Protein: 7 g/dL (ref 6.0–8.3)

## 2024-02-22 LAB — CBC WITH DIFFERENTIAL/PLATELET
Basophils Absolute: 0 K/uL (ref 0.0–0.1)
Basophils Relative: 0.4 % (ref 0.0–3.0)
Eosinophils Absolute: 0.1 K/uL (ref 0.0–0.7)
Eosinophils Relative: 2.4 % (ref 0.0–5.0)
HCT: 40.6 % (ref 39.0–52.0)
Hemoglobin: 13.5 g/dL (ref 13.0–17.0)
Lymphocytes Relative: 26 % (ref 12.0–46.0)
Lymphs Abs: 1.4 K/uL (ref 0.7–4.0)
MCHC: 33.3 g/dL (ref 30.0–36.0)
MCV: 82 fl (ref 78.0–100.0)
Monocytes Absolute: 0.4 K/uL (ref 0.1–1.0)
Monocytes Relative: 7.9 % (ref 3.0–12.0)
Neutro Abs: 3.5 K/uL (ref 1.4–7.7)
Neutrophils Relative %: 63.3 % (ref 43.0–77.0)
Platelets: 183 K/uL (ref 150.0–400.0)
RBC: 4.95 Mil/uL (ref 4.22–5.81)
RDW: 13.3 % (ref 11.5–15.5)
WBC: 5.5 K/uL (ref 4.0–10.5)

## 2024-02-22 LAB — POC URINALSYSI DIPSTICK (AUTOMATED)
Bilirubin, UA: NEGATIVE
Blood, UA: NEGATIVE
Glucose, UA: NEGATIVE
Ketones, UA: NEGATIVE
Leukocytes, UA: NEGATIVE
Nitrite, UA: NEGATIVE
Protein, UA: NEGATIVE
Spec Grav, UA: 1.01 (ref 1.010–1.025)
Urobilinogen, UA: 0.2 U/dL
pH, UA: 6 (ref 5.0–8.0)

## 2024-02-22 LAB — LIPASE: Lipase: 39 U/L (ref 11.0–59.0)

## 2024-02-22 NOTE — Assessment & Plan Note (Signed)
 Acute, differential diagnosis includes gallstone, liver irritation, pancreatitis, gastritis versus viral gastroenteritis.   Given patient's story of onset of pain after greasy meals I am most concerned for gallbladder disease.  Will evaluate with stat labs and move forward with ultrasound of right upper quadrant. Given he has a history of nephrectomy we will also rule out urinary infection with a urinalysis. Encouraged him to push fluids and to avoid greasy foods.  Return and ER precautions provided in detail.  Patient will go to the ED if he has fever, severe abdominal pain or unable to keep down liquids.

## 2024-02-22 NOTE — Assessment & Plan Note (Signed)
 Has a history of urethral stricture so is somewhat higher risk for urinary tract infection.  Will evaluate with UA in office today.

## 2024-02-22 NOTE — Progress Notes (Signed)
 Patient ID: Keith Bishop, male    DOB: [birth year], 53 y.o.   MRN: [id number]  This visit was conducted in person.  BP 118/80   Pulse 76   Temp 98 F (36.7 C) (Temporal)   Ht 6' 2.5 (1.892 m)   Wt 221 lb (100.2 kg)   SpO2 98%   BMI 28.00 kg/m    CC:  Chief Complaint  Patient presents with   Flank Pain    Right   Change in Bowel Habits   Urinary Frequency    Subjective:   HPI: Keith Bishop is a 53 y.o. male presenting on 02/22/2024 for Flank Pain (Right), Change in Bowel Habits, and Urinary Frequency  New onset pain in right flank/ side of abdomen for about 1 week.  Seemed to start after sweet tea/ chickfila.  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  Pain seem to improve over the weekend but pain returned in last 4 days , off and on... seemed to be worse after greasy food ans [employer].  Mild nausea, no emesis. Mild boating and heart burn. Has noted less regular BMS, foul smelling gas passed.  No fever.  No rash.   No recent urinary cath.   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 still has good urinary flow. Some increase in frequency   Reviewed past MRI 08/2023 Impression 1.  Status post radical left nephrectomy. Similar appearance of soft tissue in the left retroperitoneum, with encasement of the aorta. Unable to assess known vascular involvement due to absence of IV contrast (including the left renal vein). 2.  New prominent upper abdominal lymph nodes, suspicious for metastatic disease. 3.  New trace left perisplenic free fluid, finding is nonspecific, may be due to worsening vascular involvement by the retroperitoneal soft tissue density versus malignancy. 4.  Unchanged left inguinal lymphadenopathy. 5.  Large right hydrocele. 6.  Splenomegaly. Relevant past medical, surgical, family and social history reviewed and updated as  indicated. Interim medical history since our last visit reviewed. Allergies and medications reviewed and updated. Outpatient Medications Prior to Visit  Medication Sig Dispense Refill   acetaminophen  (TYLENOL ) 500 MG tablet Take 500 mg by mouth every 6 (six) hours as needed.     amLODipine  (NORVASC ) 5 MG tablet Take 1 tablet (5 mg total) by mouth daily. 90 tablet 4   doxycycline  (VIBRA -TABS) 100 MG tablet TAKE 1 TABLET (100 MG TOTAL) BY MOUTH DAILY. AS NEEDED FOR ACNE 30 tablet 1   escitalopram  (LEXAPRO ) 5 MG tablet TAKE 1 TABLET (5 MG TOTAL) BY MOUTH DAILY. 90 tablet 3   LORazepam  (ATIVAN ) 0.5 MG tablet Take 0.5-1 tablets (0.25-0.5 mg total) by mouth 2 (two) times daily as needed for anxiety. 30 tablet 0   Melatonin 10 MG SUBL Place under the tongue at bedtime. As needed     metroNIDAZOLE  (METROGEL ) 0.75 % gel Apply topically 2 (two) times daily. To affected area on face 45 g 0   Multiple Vitamins-Iron (MULTIVITAMIN PLUS IRON ADULT PO) Take by mouth daily.     mycophenolate (CELLCEPT) 500 MG tablet Take 1,000 mg by mouth 2 (two) times daily.     pantoprazole (PROTONIX) 40 MG tablet Take 1 tablet by mouth daily.     No facility-administered medications prior to visit.     Per HPI unless specifically indicated in ROS section  below Review of Systems  Constitutional:  Negative for fatigue and fever.  HENT:  Negative for ear pain.   Eyes:  Negative for pain.  Respiratory:  Negative for cough and shortness of breath.   Cardiovascular:  Negative for chest pain, palpitations and leg swelling.  Gastrointestinal:  Positive for abdominal pain, diarrhea and nausea. Negative for vomiting.  Genitourinary:  Negative for dysuria.  Musculoskeletal:  Negative for arthralgias.  Neurological:  Negative for syncope, light-headedness and headaches.  Psychiatric/Behavioral:  Negative for dysphoric mood.    Objective:  BP 118/80   Pulse 76   Temp 98 F (36.7 C) (Temporal)   Ht 6' 2.5 (1.892 m)   Wt  221 lb (100.2 kg)   SpO2 98%   BMI 28.00 kg/m   Wt Readings from Last 3 Encounters:  02/22/24 221 lb (100.2 kg)  05/19/23 215 lb 6 oz (97.7 kg)  12/14/22 225 lb (102.1 kg)      Physical Exam Vitals reviewed.  Constitutional:      Appearance: He is well-developed.  HENT:     Head: Normocephalic.     Right Ear: Hearing normal.     Left Ear: Hearing normal.     Nose: Nose normal.  Neck:     Thyroid : No thyroid  mass or thyromegaly.     Vascular: No carotid bruit.     Trachea: Trachea normal.  Cardiovascular:     Rate and Rhythm: Normal rate and regular rhythm.     Pulses: Normal pulses.     Heart sounds: Heart sounds not distant. No murmur heard.    No friction rub. No gallop.     Comments: No peripheral edema Pulmonary:     Effort: Pulmonary effort is normal. No respiratory distress.     Breath sounds: Normal breath sounds.  Abdominal:     General: Abdomen is flat. Bowel sounds are normal. There is no distension.     Palpations: Abdomen is soft. There is no hepatomegaly or mass.     Tenderness: There is abdominal tenderness in the right upper quadrant. There is no right CVA tenderness, left CVA tenderness, guarding or rebound. Negative signs include Murphy's sign.  Skin:    General: Skin is warm and dry.     Findings: No rash.  Psychiatric:        Speech: Speech normal.        Behavior: Behavior normal.        Thought Content: Thought content normal.       Results for orders placed or performed in visit on 02/22/24  POCT Urinalysis Dipstick (Automated)   Collection Time: 02/22/24  2:46 PM  Result Value Ref Range   Color, UA Yellow    Clarity, UA Clear    Glucose, UA Negative Negative   Bilirubin, UA Negative    Ketones, UA Negative    Spec Grav, UA 1.010 1.010 - 1.025   Blood, UA Negative    pH, UA 6.0 5.0 - 8.0   Protein, UA Negative Negative   Urobilinogen, UA 0.2 0.2 or 1.0 E.U./dL   Nitrite, UA Negative    Leukocytes, UA Negative Negative    Assessment  and Plan  Abdominal pain, RUQ Assessment & Plan: Acute, differential diagnosis includes gallstone, liver irritation, pancreatitis, gastritis versus viral gastroenteritis.   Given patient's story of onset of pain after greasy meals I am most concerned for gallbladder disease.  Will evaluate with stat labs and move forward with ultrasound of right upper quadrant. Given  he has a history of nephrectomy we will also rule out urinary infection with a urinalysis. Encouraged him to push fluids and to avoid greasy foods.  Return and ER precautions provided in detail.  Patient will go to the ED if he has fever, severe abdominal pain or unable to keep down liquids.  Orders: -     CBC with Differential/Platelet -     Comprehensive metabolic panel with GFR -     Lipase -     US  ABDOMEN LIMITED RUQ (LIVER/GB); Future -     POCT Urinalysis Dipstick (Automated)    No follow-ups on file.   Greig Ring, MD

## 2024-02-23 ENCOUNTER — Ambulatory Visit (HOSPITAL_BASED_OUTPATIENT_CLINIC_OR_DEPARTMENT_OTHER)
Admission: RE | Admit: 2024-02-23 | Discharge: 2024-02-23 | Disposition: A | Discharge status: Home / Self Care | Source: Ambulatory Visit | Attending: Family Medicine | Admitting: Family Medicine

## 2024-02-23 ENCOUNTER — Other Ambulatory Visit

## 2024-02-23 ENCOUNTER — Telehealth: Payer: Self-pay | Admitting: Family Medicine

## 2024-02-23 ENCOUNTER — Ambulatory Visit: Payer: Self-pay | Admitting: Family Medicine

## 2024-02-23 DIAGNOSIS — Z905 Acquired absence of kidney: Secondary | ICD-10-CM | POA: Diagnosis present

## 2024-02-23 DIAGNOSIS — N289 Disorder of kidney and ureter, unspecified: Secondary | ICD-10-CM | POA: Insufficient documentation

## 2024-02-23 NOTE — Telephone Encounter (Signed)
 Copied from CRM #[id number]. Topic: General - Other >> Feb 23, 2024  9:07 AM [name] wrote: Reason for CRM: The referral team called patient and asked could he come in today. Patient wants to know how soon does Dr. KANDICE want this handled so he can take off work.

## 2024-02-24 ENCOUNTER — Other Ambulatory Visit: Payer: Self-pay | Admitting: Family Medicine

## 2024-02-24 DIAGNOSIS — L700 Acne vulgaris: Secondary | ICD-10-CM

## 2024-02-27 NOTE — Telephone Encounter (Signed)
 Copied from CRM #[id number]. Topic: General - Other >> Feb 27, 2024 12:20 PM Sasha M wrote: Reason for CRM: Pt called in to schedule lab appt and was asking questions about providers comment to follow up with specialist. I gave him the information Dr Avelina provided in her results follow up and he wanted me to inform her that he will go ahead with the gallbladder ultrasound because his pain has become manageable to mild and states that if he has any issues or it gets worse that he will reach out.

## 2024-02-27 NOTE — Telephone Encounter (Signed)
 Doxycycline  Last filled:  01/21/24, #30 Last OV:  05/19/23, discuss meds Next OV:  none

## 2024-02-29 ENCOUNTER — Ambulatory Visit
Admission: RE | Admit: 2024-02-29 | Discharge: 2024-02-29 | Disposition: A | Discharge status: Home / Self Care | Source: Ambulatory Visit | Attending: Family Medicine | Admitting: Family Medicine

## 2024-02-29 DIAGNOSIS — R1011 Right upper quadrant pain: Secondary | ICD-10-CM

## 2024-03-01 ENCOUNTER — Observation Stay
Admission: EM | Admit: 2024-03-01 | Discharge: 2024-03-03 | Disposition: A | Discharge status: Home / Self Care | Attending: Emergency Medicine | Admitting: Emergency Medicine

## 2024-03-01 ENCOUNTER — Other Ambulatory Visit

## 2024-03-01 ENCOUNTER — Other Ambulatory Visit: Payer: Self-pay

## 2024-03-01 ENCOUNTER — Emergency Department

## 2024-03-01 DIAGNOSIS — L80 Vitiligo: Secondary | ICD-10-CM | POA: Diagnosis present

## 2024-03-01 DIAGNOSIS — N289 Disorder of kidney and ureter, unspecified: Secondary | ICD-10-CM | POA: Diagnosis not present

## 2024-03-01 DIAGNOSIS — Z8249 Family history of ischemic heart disease and other diseases of the circulatory system: Secondary | ICD-10-CM

## 2024-03-01 DIAGNOSIS — R1031 Right lower quadrant pain: Secondary | ICD-10-CM | POA: Diagnosis not present

## 2024-03-01 DIAGNOSIS — N139 Obstructive and reflux uropathy, unspecified: Principal | ICD-10-CM | POA: Diagnosis present

## 2024-03-01 DIAGNOSIS — N1832 Chronic kidney disease, stage 3b: Secondary | ICD-10-CM | POA: Insufficient documentation

## 2024-03-01 DIAGNOSIS — E875 Hyperkalemia: Secondary | ICD-10-CM | POA: Diagnosis not present

## 2024-03-01 DIAGNOSIS — Z79899 Other long term (current) drug therapy: Secondary | ICD-10-CM | POA: Diagnosis not present

## 2024-03-01 DIAGNOSIS — Z833 Family history of diabetes mellitus: Secondary | ICD-10-CM

## 2024-03-01 DIAGNOSIS — R590 Localized enlarged lymph nodes: Secondary | ICD-10-CM | POA: Diagnosis present

## 2024-03-01 DIAGNOSIS — R1909 Other intra-abdominal and pelvic swelling, mass and lump: Secondary | ICD-10-CM | POA: Diagnosis present

## 2024-03-01 DIAGNOSIS — I10 Essential (primary) hypertension: Secondary | ICD-10-CM

## 2024-03-01 DIAGNOSIS — I129 Hypertensive chronic kidney disease with stage 1 through stage 4 chronic kidney disease, or unspecified chronic kidney disease: Secondary | ICD-10-CM | POA: Insufficient documentation

## 2024-03-01 DIAGNOSIS — N35919 Unspecified urethral stricture, male, unspecified site: Secondary | ICD-10-CM | POA: Diagnosis not present

## 2024-03-01 DIAGNOSIS — Z905 Acquired absence of kidney: Secondary | ICD-10-CM | POA: Diagnosis not present

## 2024-03-01 DIAGNOSIS — Z8616 Personal history of COVID-19: Secondary | ICD-10-CM | POA: Insufficient documentation

## 2024-03-01 DIAGNOSIS — F419 Anxiety disorder, unspecified: Secondary | ICD-10-CM | POA: Insufficient documentation

## 2024-03-01 DIAGNOSIS — N189 Chronic kidney disease, unspecified: Secondary | ICD-10-CM

## 2024-03-01 DIAGNOSIS — N1330 Unspecified hydronephrosis: Principal | ICD-10-CM | POA: Insufficient documentation

## 2024-03-01 DIAGNOSIS — N131 Hydronephrosis with ureteral stricture, not elsewhere classified: Principal | ICD-10-CM | POA: Diagnosis present

## 2024-03-01 DIAGNOSIS — E291 Testicular hypofunction: Secondary | ICD-10-CM | POA: Diagnosis present

## 2024-03-01 DIAGNOSIS — K219 Gastro-esophageal reflux disease without esophagitis: Secondary | ICD-10-CM | POA: Diagnosis not present

## 2024-03-01 DIAGNOSIS — Z87891 Personal history of nicotine dependence: Secondary | ICD-10-CM | POA: Diagnosis not present

## 2024-03-01 DIAGNOSIS — Z9109 Other allergy status, other than to drugs and biological substances: Secondary | ICD-10-CM

## 2024-03-01 DIAGNOSIS — Z79624 Long term (current) use of inhibitors of nucleotide synthesis: Secondary | ICD-10-CM

## 2024-03-01 DIAGNOSIS — F32A Depression, unspecified: Secondary | ICD-10-CM | POA: Diagnosis not present

## 2024-03-01 DIAGNOSIS — R109 Unspecified abdominal pain: Secondary | ICD-10-CM | POA: Diagnosis present

## 2024-03-01 DIAGNOSIS — K760 Fatty (change of) liver, not elsewhere classified: Secondary | ICD-10-CM | POA: Diagnosis present

## 2024-03-01 DIAGNOSIS — N179 Acute kidney failure, unspecified: Secondary | ICD-10-CM | POA: Diagnosis not present

## 2024-03-01 DIAGNOSIS — E781 Pure hyperglyceridemia: Secondary | ICD-10-CM | POA: Diagnosis present

## 2024-03-01 LAB — URINALYSIS, ROUTINE W REFLEX MICROSCOPIC
Bilirubin Urine: NEGATIVE
Glucose, UA: NEGATIVE mg/dL
Hgb urine dipstick: NEGATIVE
Ketones, ur: NEGATIVE mg/dL
Leukocytes,Ua: NEGATIVE
Nitrite: NEGATIVE
Protein, ur: NEGATIVE mg/dL
Specific Gravity, Urine: 1.008 (ref 1.005–1.030)
pH: 5 (ref 5.0–8.0)

## 2024-03-01 LAB — CBC
HCT: 40 % (ref 39.0–52.0)
Hemoglobin: 13.2 g/dL (ref 13.0–17.0)
MCH: 27.7 pg (ref 26.0–34.0)
MCHC: 33 g/dL (ref 30.0–36.0)
MCV: 84 fL (ref 80.0–100.0)
Platelets: 227 K/uL (ref 150–400)
RBC: 4.76 MIL/uL (ref 4.22–5.81)
RDW: 11.9 % (ref 11.5–15.5)
WBC: 6.1 K/uL (ref 4.0–10.5)
nRBC: 0 % (ref 0.0–0.2)

## 2024-03-01 LAB — BASIC METABOLIC PANEL WITH GFR
Anion gap: 10 (ref 5–15)
BUN: 41 mg/dL — ABNORMAL HIGH (ref 6–23)
BUN: 40 mg/dL — ABNORMAL HIGH (ref 6–20)
CO2: 24 meq/L (ref 19–32)
CO2: 24 mmol/L (ref 22–32)
Calcium: 8.8 mg/dL (ref 8.4–10.5)
Calcium: 8.9 mg/dL (ref 8.9–10.3)
Chloride: 106 meq/L (ref 96–112)
Chloride: 107 mmol/L (ref 98–111)
Creatinine, Ser: 2.91 mg/dL — ABNORMAL HIGH (ref 0.40–1.50)
Creatinine, Ser: 2.67 mg/dL — ABNORMAL HIGH (ref 0.61–1.24)
GFR, Estimated: 28 mL/min — ABNORMAL LOW (ref >60)
GFR: 23.71 mL/min — ABNORMAL LOW (ref >60.00)
Glucose, Bld: 190 mg/dL — ABNORMAL HIGH (ref 70–99)
Glucose, Bld: 104 mg/dL — ABNORMAL HIGH (ref 70–99)
Potassium: 4.5 meq/L (ref 3.5–5.1)
Potassium: 4.5 mmol/L (ref 3.5–5.1)
Sodium: 142 meq/L (ref 135–145)
Sodium: 141 mmol/L (ref 135–145)

## 2024-03-01 NOTE — ED Triage Notes (Addendum)
 Pt comes with abnormal lab. Pt states he only has 1 kidney and it is functioning at 20%. Pt states right sided flank pain that had first started couple weeks ago. Pt went to his pcp and had bloodwork done. Pt states they thought might be kidney stone. Pt states more frequent urination.   Pt had hx of mass to his kidney and had it removed back in last July.

## 2024-03-01 NOTE — ED Provider Notes (Signed)
 [facility] Provider Note    Event Date/Time   First MD Initiated Contact with Patient 03/01/24 2313     (approximate)   History   Abnormal Lab   HPI  Keith Bishop is a 53 y.o. 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  Sent in by his outpatient doctor for worsening renal function.  They wanted to rule out kidney stone.  Last couple days has had right flank pain radiating to the lower abdomen.  Still making urine without dysuria or frequency.  No fevers no chills.  Has been worked up at [facility] oncology but not thought to be a malignancy process for this mass in his retroperitoneum.  Was asked to follow-up with rheumatology has not yet establish care.   External Medical Documents Reviewed: [facility] medical oncology note from April 2025 noting RP mass encasing aorta causing obstruction and leg swelling. Associated with intermittent night sweats for 5-10 years with skin rash and leg swelling. Differential includes infectious, inflammatory, or neoplastic processes. Extensive previous evaluations negative for cancer, including a left nephrectomy. At this time, my suspicion for a malignant etiology is low but we will extend our evaluation with a focus on ruling out rare conditions like Castleman's disease. Ultimately, if this evaluation is negative, I would recommend a return to treatment for a benign hyperproliferative process as previously considered (idiopathic retroperitoneal fibrosis).       Physical Exam   Triage Vital Signs: ED Triage Vitals  Encounter Vitals Group     BP 03/01/24 1833 (!) 178/105     Girls Systolic BP Percentile --      Girls Diastolic BP Percentile --      Boys Systolic BP Percentile --      Boys Diastolic BP Percentile --      Pulse Rate  03/01/24 1833 74     Resp 03/01/24 1833 18     Temp 03/01/24 1833 98 F (36.7 C)     Temp Source 03/01/24 2315 Oral     SpO2 03/01/24 1833 100 %     Weight 03/01/24 1833 221 lb (100.2 kg)     Height 03/01/24 1833 6' 3 (1.905 m)     Head Circumference --      Peak Flow --      Pain Score 03/01/24 1832 1     Pain Loc --      Pain Education --      Exclude from Growth Chart --     Most recent vital signs: Vitals:   03/01/24 2315 03/02/24 0244  BP: (!) 163/110 (!) 150/84  Pulse: 65 (!) 58  Resp: 16 18  Temp: 97.7 F (36.5 C) 98.3 F (36.8 C)  SpO2: 100% 100%    General: Awake, no distress.  CV:  Good peripheral perfusion.  Resp:  Normal effort.  Abd:  No distention.  Other:  Mild right-sided CVA tenderness and a benign nonfocal nonperitoneal abdominal exam.  Vital signs significant for mild hypertension otherwise normal, afebrile.   ED Results / Procedures / Treatments   Labs (all labs ordered are listed, but only abnormal results are displayed) Labs Reviewed  BASIC METABOLIC PANEL WITH GFR - Abnormal; Notable for the following components:      Result Value   Glucose, Bld 104 (*)    BUN 40 (*)  Creatinine, Ser 2.67 (*)    GFR, Estimated 28 (*)    All other components within normal limits  URINALYSIS, ROUTINE W REFLEX MICROSCOPIC - Abnormal; Notable for the following components:   Color, Urine STRAW (*)    APPearance CLEAR (*)    All other components within normal limits  CBC  HIV ANTIBODY (ROUTINE TESTING W REFLEX)  BASIC METABOLIC PANEL WITH GFR  CBC     I ordered and reviewed the above labs they are notable for creatinine is elevated 2.67, with a GFR of 28.  Urine without leukocytes or bacteria   RADIOLOGY I independently reviewed and interpreted CT of the abdomen and see right-sided hydronephrosis I also reviewed radiologist's formal read.   PROCEDURES:  Critical Care performed: No  Procedures   MEDICATIONS ORDERED IN ED: Medications   amLODipine  (NORVASC ) tablet 5 mg (has no administration in time range)  escitalopram  (LEXAPRO ) tablet 5 mg (has no administration in time range)  LORazepam  (ATIVAN ) tablet 0.25-0.5 mg (has no administration in time range)  pantoprazole  (PROTONIX ) EC tablet 40 mg (has no administration in time range)  melatonin tablet 10 mg (has no administration in time range)  multivitamins with iron  tablet 1 tablet (has no administration in time range)  mycophenolate  (CELLCEPT ) capsule 1,000 mg (has no administration in time range)  0.9 %  sodium chloride  infusion ( Intravenous New Bag/Given 03/02/24 0411)  acetaminophen  (TYLENOL ) tablet 650 mg (650 mg Oral Given 03/02/24 0304)    Or  acetaminophen  (TYLENOL ) suppository 650 mg ( Rectal See Alternative 03/02/24 0304)  traZODone  (DESYREL ) tablet 25 mg (has no administration in time range)  magnesium  hydroxide (MILK OF MAGNESIA) suspension 30 mL (has no administration in time range)  ondansetron  (ZOFRAN ) tablet 4 mg (has no administration in time range)    Or  ondansetron  (ZOFRAN ) injection 4 mg (has no administration in time range)  morphine  (PF) 2 MG/ML injection 2 mg (has no administration in time range)    External physician / consultants:  I spoke with on-call urologist Dr. Carolee and hospital medicine admitting consultant regarding care plan for this patient.   IMPRESSION / MDM / ASSESSMENT AND PLAN / ED COURSE  I reviewed the triage vital signs and the nursing notes.                                Patient's presentation is most consistent with acute presentation with potential threat to life or bodily function.  Differential diagnosis includes, but is not limited to, renal colic, obstructive uropathy, AKI, urine infection   The patient is on the cardiac monitor to evaluate for evidence of arrhythmia and/or significant heart rate changes.  MDM:    It seems that this retroperitoneal mass is now causing obstructive uropathy on the right side.  I am  concerned given this is only kidney left.  His creatinine function is worsening.  I consulted with urology for potential stent placement.  I have admitted to hospital medicine service.      FINAL CLINICAL IMPRESSION(S) / ED DIAGNOSES   Final diagnoses:  Obstructive uropathy  AKI (acute kidney injury) (HCC)  Right flank pain     Rx / DC Orders   ED Discharge Orders     None        Note:  This document was prepared using Dragon voice recognition software and may include unintentional dictation errors.    Cyrena Mylar, MD 03/02/24 [phone]

## 2024-03-02 ENCOUNTER — Inpatient Hospital Stay: Admitting: Anesthesiology

## 2024-03-02 ENCOUNTER — Encounter
Admission: EM | Disposition: A | Discharge status: Home / Self Care | Payer: Self-pay | Source: Home / Self Care | Attending: Emergency Medicine

## 2024-03-02 ENCOUNTER — Inpatient Hospital Stay

## 2024-03-02 DIAGNOSIS — N179 Acute kidney failure, unspecified: Secondary | ICD-10-CM

## 2024-03-02 DIAGNOSIS — Z8616 Personal history of COVID-19: Secondary | ICD-10-CM | POA: Diagnosis not present

## 2024-03-02 DIAGNOSIS — Z79899 Other long term (current) drug therapy: Secondary | ICD-10-CM | POA: Diagnosis not present

## 2024-03-02 DIAGNOSIS — R1909 Other intra-abdominal and pelvic swelling, mass and lump: Secondary | ICD-10-CM | POA: Diagnosis present

## 2024-03-02 DIAGNOSIS — K760 Fatty (change of) liver, not elsewhere classified: Secondary | ICD-10-CM | POA: Diagnosis present

## 2024-03-02 DIAGNOSIS — N131 Hydronephrosis with ureteral stricture, not elsewhere classified: Secondary | ICD-10-CM | POA: Diagnosis present

## 2024-03-02 DIAGNOSIS — E291 Testicular hypofunction: Secondary | ICD-10-CM | POA: Diagnosis present

## 2024-03-02 DIAGNOSIS — Z79624 Long term (current) use of inhibitors of nucleotide synthesis: Secondary | ICD-10-CM | POA: Diagnosis not present

## 2024-03-02 DIAGNOSIS — F419 Anxiety disorder, unspecified: Secondary | ICD-10-CM

## 2024-03-02 DIAGNOSIS — K219 Gastro-esophageal reflux disease without esophagitis: Secondary | ICD-10-CM | POA: Insufficient documentation

## 2024-03-02 DIAGNOSIS — I10 Essential (primary) hypertension: Secondary | ICD-10-CM

## 2024-03-02 DIAGNOSIS — E781 Pure hyperglyceridemia: Secondary | ICD-10-CM | POA: Diagnosis present

## 2024-03-02 DIAGNOSIS — F32A Depression, unspecified: Secondary | ICD-10-CM

## 2024-03-02 DIAGNOSIS — Z87891 Personal history of nicotine dependence: Secondary | ICD-10-CM | POA: Diagnosis not present

## 2024-03-02 DIAGNOSIS — R590 Localized enlarged lymph nodes: Secondary | ICD-10-CM | POA: Diagnosis present

## 2024-03-02 DIAGNOSIS — Z8249 Family history of ischemic heart disease and other diseases of the circulatory system: Secondary | ICD-10-CM | POA: Diagnosis not present

## 2024-03-02 DIAGNOSIS — N189 Chronic kidney disease, unspecified: Secondary | ICD-10-CM

## 2024-03-02 DIAGNOSIS — E875 Hyperkalemia: Secondary | ICD-10-CM | POA: Diagnosis not present

## 2024-03-02 DIAGNOSIS — I129 Hypertensive chronic kidney disease with stage 1 through stage 4 chronic kidney disease, or unspecified chronic kidney disease: Secondary | ICD-10-CM | POA: Diagnosis present

## 2024-03-02 DIAGNOSIS — Z9109 Other allergy status, other than to drugs and biological substances: Secondary | ICD-10-CM | POA: Diagnosis not present

## 2024-03-02 DIAGNOSIS — Z833 Family history of diabetes mellitus: Secondary | ICD-10-CM | POA: Diagnosis not present

## 2024-03-02 DIAGNOSIS — N139 Obstructive and reflux uropathy, unspecified: Secondary | ICD-10-CM | POA: Diagnosis not present

## 2024-03-02 DIAGNOSIS — L80 Vitiligo: Secondary | ICD-10-CM | POA: Diagnosis present

## 2024-03-02 DIAGNOSIS — Z905 Acquired absence of kidney: Secondary | ICD-10-CM | POA: Diagnosis not present

## 2024-03-02 DIAGNOSIS — R1031 Right lower quadrant pain: Secondary | ICD-10-CM | POA: Diagnosis present

## 2024-03-02 DIAGNOSIS — N1832 Chronic kidney disease, stage 3b: Secondary | ICD-10-CM | POA: Diagnosis present

## 2024-03-02 LAB — CBC
HCT: 36.9 % — ABNORMAL LOW (ref 39.0–52.0)
Hemoglobin: 12.8 g/dL — ABNORMAL LOW (ref 13.0–17.0)
MCH: 28.1 pg (ref 26.0–34.0)
MCHC: 34.7 g/dL (ref 30.0–36.0)
MCV: 80.9 fL (ref 80.0–100.0)
Platelets: 180 K/uL (ref 150–400)
RBC: 4.56 MIL/uL (ref 4.22–5.81)
RDW: 11.9 % (ref 11.5–15.5)
WBC: 5.9 K/uL (ref 4.0–10.5)
nRBC: 0 % (ref 0.0–0.2)

## 2024-03-02 LAB — BASIC METABOLIC PANEL WITH GFR
Anion gap: 9 (ref 5–15)
BUN: 39 mg/dL — ABNORMAL HIGH (ref 6–20)
CO2: 23 mmol/L (ref 22–32)
Calcium: 8.3 mg/dL — ABNORMAL LOW (ref 8.9–10.3)
Chloride: 109 mmol/L (ref 98–111)
Creatinine, Ser: 2.52 mg/dL — ABNORMAL HIGH (ref 0.61–1.24)
GFR, Estimated: 30 mL/min — ABNORMAL LOW (ref >60)
Glucose, Bld: 112 mg/dL — ABNORMAL HIGH (ref 70–99)
Potassium: 4.5 mmol/L (ref 3.5–5.1)
Sodium: 141 mmol/L (ref 135–145)

## 2024-03-02 LAB — HIV ANTIBODY (ROUTINE TESTING W REFLEX): HIV Screen 4th Generation wRfx: NONREACTIVE

## 2024-03-02 SURGERY — CYSTOSCOPY, WITH STENT INSERTION
Anesthesia: General | Laterality: Right

## 2024-03-02 MED ORDER — PROPOFOL 10 MG/ML IV BOLUS
INTRAVENOUS | Status: AC
  Filled 2024-03-02: qty 20

## 2024-03-02 MED ORDER — MYCOPHENOLATE MOFETIL 250 MG PO CAPS
1000.0000 mg | ORAL_CAPSULE | Freq: Two times a day (BID) | ORAL | Status: DC
  Filled 2024-03-02: qty 4

## 2024-03-02 MED ORDER — ESCITALOPRAM OXALATE 10 MG PO TABS
5.0000 mg | ORAL_TABLET | Freq: Every day | ORAL | Status: DC
  Administered 2024-03-02 – 2024-03-03 (×2): 5 mg via ORAL
  Filled 2024-03-02 (×2): qty 1

## 2024-03-02 MED ORDER — CEFAZOLIN SODIUM-DEXTROSE 2-3 GM-%(50ML) IV SOLR
INTRAVENOUS | Status: DC | PRN
  Administered 2024-03-02: 2 g via INTRAVENOUS

## 2024-03-02 MED ORDER — FENTANYL CITRATE (PF) 100 MCG/2ML IJ SOLN: 25.0000 ug | INTRAMUSCULAR | Status: DC | PRN

## 2024-03-02 MED ORDER — PROPOFOL 10 MG/ML IV BOLUS
INTRAVENOUS | Status: DC | PRN
  Administered 2024-03-02: 200 mg via INTRAVENOUS

## 2024-03-02 MED ORDER — MELATONIN 5 MG PO TABS: 10.0000 mg | ORAL_TABLET | Freq: Every evening | ORAL | Status: DC | PRN

## 2024-03-02 MED ORDER — MAGNESIUM HYDROXIDE 400 MG/5ML PO SUSP: 30.0000 mL | Freq: Every day | ORAL | Status: DC | PRN

## 2024-03-02 MED ORDER — IOHEXOL 180 MG/ML  SOLN
INTRAMUSCULAR | Status: DC | PRN
  Administered 2024-03-02: 20 mL

## 2024-03-02 MED ORDER — TRAZODONE HCL 50 MG PO TABS: 25.0000 mg | ORAL_TABLET | Freq: Every evening | ORAL | Status: DC | PRN

## 2024-03-02 MED ORDER — OXYCODONE-ACETAMINOPHEN 5-325 MG PO TABS
1.0000 | ORAL_TABLET | Freq: Four times a day (QID) | ORAL | Status: DC | PRN
  Administered 2024-03-02 – 2024-03-03 (×3): 2 via ORAL
  Filled 2024-03-02 (×3): qty 2

## 2024-03-02 MED ORDER — MIDAZOLAM HCL 2 MG/2ML IJ SOLN
INTRAMUSCULAR | Status: AC
  Filled 2024-03-02: qty 2

## 2024-03-02 MED ORDER — MORPHINE SULFATE (PF) 2 MG/ML IV SOLN: 2.0000 mg | INTRAVENOUS | Status: DC | PRN

## 2024-03-02 MED ORDER — PANTOPRAZOLE SODIUM 40 MG PO TBEC: 40.0000 mg | DELAYED_RELEASE_TABLET | Freq: Every day | ORAL | Status: DC

## 2024-03-02 MED ORDER — LIDOCAINE HCL (PF) 2 % IJ SOLN
INTRAMUSCULAR | Status: AC
  Filled 2024-03-02: qty 5

## 2024-03-02 MED ORDER — MIDAZOLAM HCL 2 MG/2ML IJ SOLN
INTRAMUSCULAR | Status: DC | PRN
  Administered 2024-03-02: 2 mg via INTRAVENOUS

## 2024-03-02 MED ORDER — TAB-A-VITE/IRON PO TABS
1.0000 | ORAL_TABLET | Freq: Every day | ORAL | Status: DC
  Administered 2024-03-02 – 2024-03-03 (×2): 1 via ORAL
  Filled 2024-03-02 (×2): qty 1

## 2024-03-02 MED ORDER — STERILE WATER FOR IRRIGATION IR SOLN
Status: DC | PRN
  Administered 2024-03-02: 2000 mL

## 2024-03-02 MED ORDER — OXYCODONE HCL 5 MG/5ML PO SOLN: 5.0000 mg | Freq: Once | ORAL | Status: DC | PRN

## 2024-03-02 MED ORDER — CHLORHEXIDINE GLUCONATE CLOTH 2 % EX PADS
6.0000 | MEDICATED_PAD | Freq: Every day | CUTANEOUS | Status: DC
  Administered 2024-03-02 – 2024-03-03 (×2): 6 via TOPICAL

## 2024-03-02 MED ORDER — SODIUM CHLORIDE 0.9 % IV SOLN
INTRAVENOUS | Status: DC
Start: 2024-03-02 — End: 2024-03-02

## 2024-03-02 MED ORDER — DEXAMETHASONE SODIUM PHOSPHATE 10 MG/ML IJ SOLN
INTRAMUSCULAR | Status: DC | PRN
  Administered 2024-03-02: 10 mg via INTRAVENOUS

## 2024-03-02 MED ORDER — FENTANYL CITRATE (PF) 100 MCG/2ML IJ SOLN
INTRAMUSCULAR | Status: DC | PRN
  Administered 2024-03-02: 25 ug via INTRAVENOUS
  Administered 2024-03-02: 50 ug via INTRAVENOUS
  Administered 2024-03-02: 25 ug via INTRAVENOUS

## 2024-03-02 MED ORDER — ACETAMINOPHEN 650 MG RE SUPP
650.0000 mg | Freq: Four times a day (QID) | RECTAL | Status: DC | PRN
Start: 2024-03-02 — End: 2024-03-03

## 2024-03-02 MED ORDER — MIRABEGRON ER 50 MG PO TB24
50.0000 mg | ORAL_TABLET | Freq: Every day | ORAL | Status: DC
  Administered 2024-03-02 – 2024-03-03 (×2): 50 mg via ORAL
  Filled 2024-03-02 (×2): qty 1

## 2024-03-02 MED ORDER — LIDOCAINE HCL (CARDIAC) PF 100 MG/5ML IV SOSY
PREFILLED_SYRINGE | INTRAVENOUS | Status: DC | PRN
  Administered 2024-03-02: 100 mg via INTRAVENOUS

## 2024-03-02 MED ORDER — EPHEDRINE SULFATE-NACL 50-0.9 MG/10ML-% IV SOSY
PREFILLED_SYRINGE | INTRAVENOUS | Status: DC | PRN
  Administered 2024-03-02 (×2): 5 mg via INTRAVENOUS

## 2024-03-02 MED ORDER — OXYCODONE HCL 5 MG PO TABS: 5.0000 mg | ORAL_TABLET | Freq: Once | ORAL | Status: DC | PRN

## 2024-03-02 MED ORDER — LORAZEPAM 0.5 MG PO TABS: 0.2500 mg | ORAL_TABLET | Freq: Two times a day (BID) | ORAL | Status: DC | PRN

## 2024-03-02 MED ORDER — FENTANYL CITRATE (PF) 100 MCG/2ML IJ SOLN
INTRAMUSCULAR | Status: AC
  Filled 2024-03-02: qty 2

## 2024-03-02 MED ORDER — AMLODIPINE BESYLATE 5 MG PO TABS
5.0000 mg | ORAL_TABLET | Freq: Every day | ORAL | Status: DC
  Administered 2024-03-02 – 2024-03-03 (×2): 5 mg via ORAL
  Filled 2024-03-02 (×2): qty 1

## 2024-03-02 MED ORDER — ONDANSETRON HCL 4 MG/2ML IJ SOLN: 4.0000 mg | Freq: Four times a day (QID) | INTRAMUSCULAR | Status: DC | PRN

## 2024-03-02 MED ORDER — ACETAMINOPHEN 325 MG PO TABS
650.0000 mg | ORAL_TABLET | Freq: Four times a day (QID) | ORAL | Status: DC | PRN
  Administered 2024-03-02: 650 mg via ORAL
  Filled 2024-03-02: qty 2

## 2024-03-02 MED ORDER — TAMSULOSIN HCL 0.4 MG PO CAPS
0.4000 mg | ORAL_CAPSULE | Freq: Every day | ORAL | Status: DC
  Administered 2024-03-02 – 2024-03-03 (×2): 0.4 mg via ORAL
  Filled 2024-03-02 (×2): qty 1

## 2024-03-02 MED ORDER — ONDANSETRON HCL 4 MG/2ML IJ SOLN
INTRAMUSCULAR | Status: DC | PRN
  Administered 2024-03-02: 4 mg via INTRAVENOUS

## 2024-03-02 MED ORDER — ONDANSETRON HCL 4 MG PO TABS: 4.0000 mg | ORAL_TABLET | Freq: Four times a day (QID) | ORAL | Status: DC | PRN

## 2024-03-02 SURGICAL SUPPLY — 19 items
BAG DRAIN SIEMENS DORNER NS (MISCELLANEOUS) ×4 IMPLANT
BAG URINE DRAIN 2000ML AR STRL (UROLOGICAL SUPPLIES) IMPLANT
BRUSH SCRUB EZ 4% CHG (MISCELLANEOUS) ×2 IMPLANT
CATH FOL 2WAY LX 18X30 (CATHETERS) IMPLANT
CATH URETL OPEN 5X70 (CATHETERS) ×2 IMPLANT
DRAPE UTILITY 15X26 TOWEL STRL (DRAPES) ×2 IMPLANT
GAUZE 4X4 16PLY ~~LOC~~+RFID DBL (SPONGE) ×4 IMPLANT
GOWN STRL REUS W/ TWL LRG LVL3 (GOWN DISPOSABLE) ×2 IMPLANT
GUIDEWIRE STR DUAL SENSOR (WIRE) ×2 IMPLANT
KIT TURNOVER CYSTO (KITS) ×2 IMPLANT
MANIFOLD NEPTUNE II (INSTRUMENTS) ×2 IMPLANT
PACK CYSTO AR (MISCELLANEOUS) ×2 IMPLANT
SET CYSTO W/LG BORE CLAMP LF (SET/KITS/TRAYS/PACK) ×2 IMPLANT
SOL .9 NS 3000ML IRR UROMATIC (IV SOLUTION) ×2 IMPLANT
STENT URET 6FRX26 CONTOUR (STENTS) IMPLANT
SURGILUBE 2OZ TUBE FLIPTOP (MISCELLANEOUS) ×2 IMPLANT
TRAP FLUID SMOKE EVACUATOR (MISCELLANEOUS) ×2 IMPLANT
WATER STERILE IRR 1000ML POUR (IV SOLUTION) ×2 IMPLANT
WATER STERILE IRR 500ML POUR (IV SOLUTION) ×2 IMPLANT

## 2024-03-02 NOTE — Progress Notes (Signed)
 PROGRESS NOTE    Keith Bishop  FMW:[id number] DOB: [birth year] DOA: 03/01/2024 PCP: Rilla Baller, MD   Assessment & Plan:   Principal Problem:   Acute unilateral obstructive uropathy Active Problems:   Acute kidney injury superimposed on chronic kidney disease (HCC)   Essential hypertension   Anxiety and depression   GERD without esophagitis  Assessment and Plan: Acute unilateral obstructive uropathy: s/p cystoscopy w/ urethral dilation & right ureteral stent as per uro. Will need further work-up outpatient at [facility]. Continue on flomax , mirabegron  as per uro. Uro following and recs apprec    AKI on CKDIIIb: Cr is trending down from day prior. Likely secondary to above. Avoid nephrotoxic meds  Hx of retroperitoneal fibroproliferative disorder: management per specialist at [facility]   HTN: continue on home dose of amlodipine    Depression: severity unknown. Continue on home dose of lexapro    GERD: continue on PPI       DVT prophylaxis:SCDs Code Status: full Family Communication: discussed pt's care w/ pt's family at bedside and answered their questions Disposition Plan: likely d/c back home   Level of care: Med-Surg  Status is: Inpatient Remains inpatient appropriate because: severity of illness    Consultants:  Uro   Procedures:   Antimicrobials:    Subjective: Pt c/o feeling of having to urinate after stent placed  Objective: Vitals:   03/01/24 1833 03/01/24 2315 03/02/24 0244  BP: (!) 178/105 (!) 163/110 (!) 150/84  Pulse: 74 65 (!) 58  Resp: 18 16 18   Temp: 98 F (36.7 C) 97.7 F (36.5 C) 98.3 F (36.8 C)  TempSrc:  Oral Oral  SpO2: 100% 100% 100%  Weight: 100.2 kg  99.8 kg  Height: 6' 3 (1.905 m)  6' 3 (1.905 m)   No intake or output data in the 24 hours ending 03/02/24 0845 Filed Weights   03/01/24 1833 03/02/24 0244  Weight: 100.2 kg 99.8 kg    Examination:  General exam: Appears calm but uncomfortable  Respiratory system: Clear to  auscultation. Respiratory effort normal. Cardiovascular system: S1 & S2 +. No rubs, gallops or clicks Gastrointestinal system: Abdomen is nondistended, soft and nontender. Normal bowel sounds heard. Central nervous system: Alert and oriented. Moves all extremities  Psychiatry: Judgement and insight appear normal. Mood & affect appropriate.     Data Reviewed: I have personally reviewed following labs and imaging studies  CBC: Recent Labs  Lab 03/01/24 1848 03/02/24 0655  WBC 6.1 5.9  HGB 13.2 12.8*  HCT 40.0 36.9*  MCV 84.0 80.9  PLT 227 180   Basic Metabolic Panel: Recent Labs  Lab 03/01/24 0813 03/01/24 1848 03/02/24 0655  NA 142 141 141  K 4.5 4.5 4.5  CL 106 107 109  CO2 24 24 23   GLUCOSE 190* 104* 112*  BUN 41* 40* 39*  CREATININE 2.91* 2.67* 2.52*  CALCIUM 8.8 8.9 8.3*   GFR: Estimated Creatinine Clearance: 40.1 mL/min (A) (by C-G formula based on SCr of 2.52 mg/dL (H)). Liver Function Tests: No results for input(s): AST, ALT, ALKPHOS, BILITOT, PROT, ALBUMIN in the last 168 hours. No results for input(s): LIPASE, AMYLASE in the last 168 hours. No results for input(s): AMMONIA in the last 168 hours. Coagulation Profile: No results for input(s): INR, PROTIME in the last 168 hours. Cardiac Enzymes: No results for input(s): CKTOTAL, CKMB, CKMBINDEX, TROPONINI in the last 168 hours. BNP (last 3 results) No results for input(s): PROBNP in the last 8760 hours. HbA1C: No results for input(s): HGBA1C  in the last 72 hours. CBG: No results for input(s): GLUCAP in the last 168 hours. Lipid Profile: No results for input(s): CHOL, HDL, LDLCALC, TRIG, CHOLHDL, LDLDIRECT in the last 72 hours. Thyroid  Function Tests: No results for input(s): TSH, T4TOTAL, FREET4, T3FREE, THYROIDAB in the last 72 hours. Anemia Panel: No results for input(s): VITAMINB12, FOLATE, FERRITIN, TIBC, IRON , RETICCTPCT in the  last 72 hours. Sepsis Labs: No results for input(s): PROCALCITON, LATICACIDVEN in the last 168 hours.  No results found for this or any previous visit (from the past 240 hours).       Radiology Studies: CT Renal Stone Study Result Date: 03/01/2024 CLINICAL DATA:  Flank pain, left nephrectomy EXAM: CT ABDOMEN AND PELVIS WITHOUT CONTRAST TECHNIQUE: Multidetector CT imaging of the abdomen and pelvis was performed following the standard protocol without IV contrast. RADIATION DOSE REDUCTION: This exam was performed according to the departmental dose-optimization program which includes automated exposure control, adjustment of the mA and/or kV according to patient size and/or use of iterative reconstruction technique. COMPARISON:  Ultrasound 02/23/2024, PET CT 11/16/2022, CT 10/01/2022 stable 10 mm low-density right adrenal nodule FINDINGS: Lower chest: Lung bases demonstrate no acute airspace disease. Hepatobiliary: Contracted gallbladder. No calcified stones. No focal hepatic abnormality allowing for absence of contrast. No biliary dilatation Pancreas: Unremarkable. No pancreatic ductal dilatation or surrounding inflammatory changes. Spleen: Slightly enlarged at 15 cm Adrenals/Urinary Tract: Thickened right adrenal gland with stable 10 mm low-density right adrenal nodules, series 2, image 27. Stable slightly thickened left adrenal gland without dominant mass. Status post left nephrectomy. Right renal cyst measuring up to 5 cm for which no specific imaging follow-up is recommended. New moderate right hydronephrosis and hydroureter. There is mild right perinephric and Peri ureteral soft tissue stranding, but no obstructing stone is seen. The distal ureter in the pelvis is of more normal caliber. The bladder is unremarkable. Stomach/Bowel: The stomach is within normal limits. There is no dilated small bowel. No acute bowel wall thickening. Negative appendix. Vascular/Lymphatic: Mild aortic atherosclerosis.  No aneurysmal dilatation. Small porta hepatis nodes measuring up to 9 mm. Abnormal ill-defined left para aortic soft tissue thickening measuring about 33 x 18 mm on series 2, image 30, with mild asymmetrical thickening of the left diaphragmatic crux. Abnormal soft tissue thickening in the left para aortic region extends inferiorly in the retroperitoneum to the level of the bifurcation. Suspicion of ill-defined soft tissue thickening at the bifurcation as well but limited without contrast. Small left inguinal nodes measuring up to 10 mm, mildly decreased. Reproductive: Negative prostate.  Moderate right scrotal hydrocele Other: Negative for pelvic effusion or free air Musculoskeletal: No acute or suspicious osseous abnormality IMPRESSION: 1. Status post left nephrectomy. New (compared with prior PET CT from May 2024 ) moderate right hydronephrosis and proximal hydroureter with mild right perinephric and periureteral soft tissue stranding. More normal caliber distal ureter. No obstructing ureteral stone. Possible source for obstruction could be abnormal residual soft tissue thickening or matted adenopathy within the retroperitoneum. 2. Status post left nephrectomy. Residual abnormal ill-defined left para aortic soft tissue thickening extending from just below the level of the left adrenal gland to the aortic bifurcation/proximal common iliac vessels. Suspicion of mild asymmetrical thickening or nodularity of the left diaphragmatic crux as well 3. Slight decreased left inguinal nodes. Few nonspecific porta hepatis nodes. 4. Stable splenomegaly. 5. Moderate right-sided hydrocele 6. Aortic atherosclerosis. Aortic Atherosclerosis (ICD10-I70.0). Electronically Signed   By: Luke Bun M.D.   On: 03/01/2024 21:10  Scheduled Meds:  amLODipine   5 mg Oral Daily   escitalopram   5 mg Oral Daily   multivitamins with iron   1 tablet Oral Daily   mycophenolate   1,000 mg Oral BID   pantoprazole   40 mg Oral Daily    Continuous Infusions:  sodium chloride  100 mL/hr at 03/02/24 0411     LOS: 0 days        Anthony CHRISTELLA Pouch, MD [facility] Pager [phone]  If 7PM-7AM, please contact night-coverage [email or web address] 03/02/2024, 8:45 AM
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
## 2024-03-02 NOTE — Plan of Care (Addendum)
 Pt transfer from ED; Pt alert, oriented to room with call light within reach, bed in lowest position.   Problem: Clinical Measurements: Goal: Ability to maintain clinical measurements within normal limits will improve Outcome: Progressing   Problem: Activity: Goal: Risk for activity intolerance will decrease Outcome: Progressing   Problem: Coping: Goal: Level of anxiety will decrease Outcome: Progressing   Problem: Safety: Goal: Ability to remain free from injury will improve Outcome: Progressing   Problem: Pain Managment: Goal: General experience of comfort will improve and/or be controlled Outcome: Progressing   Problem: Elimination: Goal: Will not experience complications related to bowel motility Outcome: Progressing   Problem: Skin Integrity: Goal: Risk for impaired skin integrity will decrease Outcome: Progressing

## 2024-03-02 NOTE — Transfer of Care (Signed)
 Immediate Anesthesia Transfer of Care Note  Patient: Keith Bishop  Procedure(s) Performed: CYSTOSCOPY, WITH STENT INSERTION (Right)  Patient Location: PACU  Anesthesia Type:General  Level of Consciousness: awake, alert , and oriented  Airway & Oxygen Therapy: Patient Spontanous Breathing and Patient connected to face mask oxygen  Post-op Assessment: Report given to RN and Post -op Vital signs reviewed and stable  Post vital signs: Reviewed and stable  Last Vitals:  Vitals Value Taken Time  BP 161/93 03/02/24 10:16  Temp    Pulse 81 03/02/24 10:18  Resp 8 03/02/24 10:18  SpO2 100 % 03/02/24 10:18  Vitals shown include unfiled device data.  Last Pain:  Vitals:   03/02/24 0851  TempSrc:   PainSc: 0-No pain         Complications: No notable events documented.

## 2024-03-02 NOTE — Assessment & Plan Note (Signed)
-   The patient will be hydrated with IV normal saline will follow BMP. - Will avoid nephrotoxins.

## 2024-03-02 NOTE — Plan of Care (Signed)
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
## 2024-03-02 NOTE — Assessment & Plan Note (Signed)
-   Will continue antihypertensive therapy.

## 2024-03-02 NOTE — Assessment & Plan Note (Signed)
-   Will continue Ativan and Lexapro.

## 2024-03-02 NOTE — Assessment & Plan Note (Signed)
-   The patient will be admitted to a medical-surgical bed. - He presents will be kept NPO. - Will be hydrating him with IV normal saline. - Urology consult to be obtained. - Dr. Carolee was notified and is aware about the patient.

## 2024-03-02 NOTE — Op Note (Signed)
 Operative Note  Preoperative diagnosis:  1.  Right hydronephrosis 2.  Urethral stricture  Postoperative diagnosis: 1.  Right hydronephrosis secondary to extrinsic compression 2.  Fossa navicularis stricture  Procedure(s): 1.  Cystoscopy with urethral dilation 2.  Right retrograde pyelogram, right diagnostic ureteroscopy, right ureteral stent placement  Surgeon: Sherwood Edison, MD  Assistants: None  Anesthesia: General  Complications: None immediate  EBL: Minimal  Specimens: 1.  None  Drains/Catheters: 1.  18 French Foley catheter 2.  6 x 26 double-J ureteral stent  Intraoperative findings: 1.  Patient had a fossa navicularis stricture that was dilated from 12 [place] up to 28 [place].  Remainder of anterior urethra without stricture.  He had a minimally obstructing prostate. 2.  Bladder mucosa without any tumors masses or stones 3.  Right retrograde pyelogram revealed an area of narrowing measuring about 2 to 3 cm in the mid ureter with upstream hydroureteronephrosis. 4.  Right ureteroscopy confirmed an approximately 2 to 3 cm area of narrowing that appeared to be extrinsic compression.  There was no tumor/mass or stone.  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  He also has a history of urethral stricture.  Description of procedure:  The patient was identified and consent was obtained.  The patient was taken to the operating room and placed in the supine position.  The patient was placed under general anesthesia.  Perioperative antibiotics were administered.  The patient was placed in dorsal lithotomy.  Patient was prepped and draped in a standard sterile fashion and a timeout was performed.  A 21 French rigid cystoscope was advanced into the meatus.  There was a fossa navicularis stricture.  I therefore dilated with sounds from 12 [place] up to 28 [place].  I was then  easily able to pass the 21 French cystoscope into the urethra and into the bladder.  Complete cystoscopy was performed with findings noted above.  Right ureteral orifice was intubated with an open-ended ureteral catheter and retrograde pyelogram was performed with the findings noted above.  A wire was then advanced up the right ureter and into the kidney under fluoroscopic guidance.  Semirigid ureteroscopy was performed alongside the wire and he was noted to have a area of narrowing that was able to be passed with the scope but it was a little bit tight.  Was consistent with extrinsic compression.  There was no tumor or mass within the ureter.  I withdrew the scope.  I backloaded the wire onto rigid cystoscope and advanced that into the bladder followed by routine placement of a 6 x 26 double-J ureteral stent.  Fluoroscopy confirmed proximal placement and direct visualization confirmed a good coil in the bladder.  I drained the bladder withdrew the scope.  Patient tolerated the procedure well was stable postoperatively.  Plan: Patient will keep the Foley catheter for 5 to 7 days.  He will need to follow-up [facility] to continue workup of the retroperitoneal process.  He will need a stent exchange in 3 months.

## 2024-03-02 NOTE — Assessment & Plan Note (Signed)
-   Will continue PPI therapy.

## 2024-03-02 NOTE — Anesthesia Postprocedure Evaluation (Signed)
 Anesthesia Post Note  Patient: Keith Bishop  Procedure(s) Performed: CYSTOSCOPY, WITH STENT INSERTION (Right)  Patient location during evaluation: PACU Anesthesia Type: General Level of consciousness: awake and alert Pain management: pain level controlled Vital Signs Assessment: post-procedure vital signs reviewed and stable Respiratory status: spontaneous breathing, nonlabored ventilation, respiratory function stable and patient connected to nasal cannula oxygen Cardiovascular status: blood pressure returned to baseline and stable Postop Assessment: no apparent nausea or vomiting Anesthetic complications: no   No notable events documented.   Last Vitals:  Vitals:   03/02/24 1030 03/02/24 1036  BP: (!) 174/108 (!) 154/100  Pulse: 74 77  Resp: (!) 9 12  Temp:    SpO2: 100% 98%    Last Pain:  Vitals:   03/02/24 0851  TempSrc:   PainSc: 0-No pain                 [name]

## 2024-03-03 ENCOUNTER — Encounter: Payer: Self-pay | Admitting: Urology

## 2024-03-03 DIAGNOSIS — N139 Obstructive and reflux uropathy, unspecified: Secondary | ICD-10-CM | POA: Diagnosis not present

## 2024-03-03 LAB — POTASSIUM: Potassium: 4.4 mmol/L (ref 3.5–5.1)

## 2024-03-03 LAB — CBC
HCT: 38.4 % — ABNORMAL LOW (ref 39.0–52.0)
Hemoglobin: 12.9 g/dL — ABNORMAL LOW (ref 13.0–17.0)
MCH: 27.5 pg (ref 26.0–34.0)
MCHC: 33.6 g/dL (ref 30.0–36.0)
MCV: 81.9 fL (ref 80.0–100.0)
Platelets: 218 K/uL (ref 150–400)
RBC: 4.69 MIL/uL (ref 4.22–5.81)
RDW: 11.9 % (ref 11.5–15.5)
WBC: 7.8 K/uL (ref 4.0–10.5)
nRBC: 0 % (ref 0.0–0.2)

## 2024-03-03 LAB — BASIC METABOLIC PANEL WITH GFR
Anion gap: 9 (ref 5–15)
BUN: 36 mg/dL — ABNORMAL HIGH (ref 6–20)
CO2: 23 mmol/L (ref 22–32)
Calcium: 8.7 mg/dL — ABNORMAL LOW (ref 8.9–10.3)
Chloride: 107 mmol/L (ref 98–111)
Creatinine, Ser: 2.34 mg/dL — ABNORMAL HIGH (ref 0.61–1.24)
GFR, Estimated: 32 mL/min — ABNORMAL LOW (ref >60)
Glucose, Bld: 154 mg/dL — ABNORMAL HIGH (ref 70–99)
Potassium: 5.2 mmol/L — ABNORMAL HIGH (ref 3.5–5.1)
Sodium: 139 mmol/L (ref 135–145)

## 2024-03-03 MED ORDER — MIRABEGRON ER 50 MG PO TB24: 50.0000 mg | ORAL_TABLET | Freq: Every day | ORAL | 0 refills | Status: DC

## 2024-03-03 MED ORDER — SODIUM ZIRCONIUM CYCLOSILICATE 10 G PO PACK
10.0000 g | PACK | Freq: Once | ORAL | Status: DC
  Filled 2024-03-03: qty 1

## 2024-03-03 MED ORDER — POLYETHYLENE GLYCOL 3350 17 G PO PACK: 17.0000 g | PACK | Freq: Every day | ORAL | Status: DC

## 2024-03-03 MED ORDER — TAMSULOSIN HCL 0.4 MG PO CAPS: 0.4000 mg | ORAL_CAPSULE | Freq: Every day | ORAL | 0 refills | Status: AC

## 2024-03-03 MED ORDER — OXYCODONE-ACETAMINOPHEN 5-325 MG PO TABS: 1.0000 | ORAL_TABLET | Freq: Four times a day (QID) | ORAL | 0 refills | Status: AC | PRN

## 2024-03-03 MED ORDER — POLYETHYLENE GLYCOL 3350 17 G PO PACK
17.0000 g | PACK | Freq: Every day | ORAL | Status: DC
  Administered 2024-03-03: 17 g via ORAL
  Filled 2024-03-03: qty 1

## 2024-03-03 NOTE — Plan of Care (Signed)
  Problem: Clinical Measurements: Goal: Ability to maintain clinical measurements within normal limits will improve Outcome: Progressing   Problem: Activity: Goal: Risk for activity intolerance will decrease Outcome: Progressing   Problem: Nutrition: Goal: Adequate nutrition will be maintained Outcome: Progressing   Problem: Coping: Goal: Level of anxiety will decrease Outcome: Progressing   Problem: Safety: Goal: Ability to remain free from injury will improve Outcome: Progressing   Problem: Pain Managment: Goal: General experience of comfort will improve and/or be controlled Outcome: Progressing

## 2024-03-03 NOTE — Discharge Summary (Signed)
 Physician Discharge Summary  Keith Bishop FMW:[id number] DOB: 1971-06-13 DOA: 03/01/2024  PCP: Rilla Baller, MD  Admit date: 03/01/2024 Discharge date: 03/03/2024  Admitted From: home  Disposition:  home   Recommendations for Outpatient Follow-up:  Follow up with PCP in 1-2 weeks F/u w/ uro, Dr. Carolee, in 5-7 days for voiding trial F/u w/ [facility] rheum, uro & onco for further workup on retroperitoneal fibroproliferative disorder  Home Health: no Equipment/Devices:  Discharge Condition: stable  CODE STATUS: full  Diet recommendation: regular  Brief/Interim Summary: HPI was taken from Dr. Lawence:   Keith Bishop is a 53 y.o. 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  He has been having worsening right flank pain.  He denies any nausea or vomiting or abdominal pain.  No fever or chills.  No dysuria, oliguria or hematuria or flank pain.  No chest pain or palpitations.  No cough or wheezing or dyspnea.  No bleeding diathesis.   ED Course: When he came to the ER, BP was 178/105 with otherwise normal vital signs.  Labs revealed a BUN of 41 earlier in the morning with a creatinine 2.91 and later on this afternoon BUN of 40 with a creatinine of 2.67.  Blood glucose was 190 earlier during the day and later 104.  CBC was within normal.  UA was negative. EKG as reviewed by me : None Imaging: Renal stone study revealed the following: 1. Status post left nephrectomy. New (compared with prior PET CT from May 2024 ) moderate right hydronephrosis and proximal hydroureter with mild right perinephric and periureteral soft tissue stranding. More normal caliber distal ureter. No  obstructing ureteral stone. Possible source for obstruction could be abnormal residual soft tissue thickening or matted adenopathy within the retroperitoneum. 2. Status post left nephrectomy. Residual abnormal ill-defined left para aortic soft tissue thickening extending from just below the level of the left adrenal gland to the aortic bifurcation/proximal common iliac vessels. Suspicion of mild asymmetrical thickening or nodularity of the left diaphragmatic crux as well 3. Slight decreased left inguinal nodes. Few nonspecific porta hepatis nodes. 4. Stable splenomegaly. 5. Moderate right-sided hydrocele 6. Aortic atherosclerosis.   Contact was made with Dr. Carolee and the patient will be n.p.o. after midnight for potential cystoscopy and likely urethral dilatation and right renal stent surgeon.  The patient will be admitted to a medical-surgical bed for further evaluation and management.  Discharge Diagnoses:  Principal Problem:   Acute unilateral obstructive uropathy Active Problems:   Acute kidney injury superimposed on chronic kidney disease (HCC)   Essential hypertension   Anxiety and depression   GERD without esophagitis  Acute unilateral obstructive uropathy: s/p cystoscopy w/ urethral dilation & right ureteral stent as per uro. Voiding trial outpatient w/ uro in 5-7 days as per uro. Will need stent exchange every 3 months as per uro. Will need further work-up outpatient at [facility]. Continue on flomax , mirabegron  as per uro. Uro following and recs apprec    AKI on CKDIIIb: Cr is trending down again today. Likely secondary to above. Avoid nephrotoxic meds   Hx of retroperitoneal fibroproliferative disorder: management per specialist at [facility]  Hyperkalemia: repeat K is WNL. Resolved    HTN: continue on home dose of amlodipine    Depression:  severity unknown. Continue on home dose of lexapro    GERD: continue on PPI     Discharge Instructions  Discharge Instructions     Diet  general   Complete by: As directed    Discharge instructions   Complete by: As directed    F/u w/ PCP in 1-2 weeks. F/u w/ uro, Dr. Carolee, in 5-7 days for voiding trial. Will need a stent exchange in 3 months as per urology. F/u w/ [facility] rhem, onco, & uro for continued work-up on retroperitoneal fibroproliferative disorder   Increase activity slowly   Complete by: As directed    No wound care   Complete by: As directed       Allergies as of 03/03/2024       Reactions   Gramineae Pollens Cough        Medication List     STOP taking these medications    LORazepam  0.5 MG tablet Commonly known as: ATIVAN    Melatonin 10 MG Subl   mycophenolate  500 MG tablet Commonly known as: CELLCEPT    pantoprazole  40 MG tablet Commonly known as: PROTONIX        TAKE these medications    acetaminophen  500 MG tablet Commonly known as: TYLENOL  Take 500 mg by mouth every 6 (six) hours as needed.   amLODipine  5 MG tablet Commonly known as: NORVASC  Take 1 tablet (5 mg total) by mouth daily.   doxycycline  100 MG tablet Commonly known as: VIBRA -TABS TAKE 1 TABLET (100 MG TOTAL) BY MOUTH DAILY. AS NEEDED FOR ACNE   escitalopram  5 MG tablet Commonly known as: LEXAPRO  TAKE 1 TABLET (5 MG TOTAL) BY MOUTH DAILY.   metroNIDAZOLE  0.75 % gel Commonly known as: METROGEL  Apply topically 2 (two) times daily. To affected area on face   mirabegron  ER 50 MG Tb24 tablet Commonly known as: MYRBETRIQ  Take 1 tablet (50 mg total) by mouth daily. Start taking on: March 04, 2024   MULTIVITAMIN PLUS IRON  ADULT PO Take by mouth daily.   oxyCODONE -acetaminophen  5-325 MG tablet Commonly known as: PERCOCET/ROXICET Take 1 tablet by mouth every 6 (six) hours as needed for up to 5 days for moderate pain (pain score 4-6) or severe pain (pain score 7-10).   tamsulosin  0.4 MG Caps capsule Commonly known as: FLOMAX  Take 1 capsule (0.4 mg total) by mouth daily. Start taking on: March 04, 2024         Follow-up Information     Carolee Sherwood JONETTA DOUGLAS, MD Follow up.   Specialty: Urology Why: F/u in 5-7 days for voiding trial Contact information: [address] [place] [address] [phone]                Allergies  Allergen Reactions   Gramineae Pollens Cough    Consultations: Uro    Procedures/Studies: DG OR UROLOGY CYSTO IMAGE ([facility] ONLY) Result Date: 03/02/2024 There is no interpretation for this exam.  This order is for images obtained during a surgical procedure.  Please See Surgeries Tab for more information regarding the procedure.   CT Renal Stone Study Result Date: 03/01/2024 CLINICAL DATA:  Flank pain, left nephrectomy EXAM: CT ABDOMEN AND PELVIS WITHOUT CONTRAST TECHNIQUE: Multidetector CT imaging of the abdomen and pelvis was performed following the standard protocol without IV contrast. RADIATION DOSE REDUCTION: This exam was performed according to the departmental dose-optimization program which includes automated exposure control, adjustment of the mA and/or kV according to patient size and/or use of iterative reconstruction technique. COMPARISON:  Ultrasound 02/23/2024,  PET CT 11/16/2022, CT 10/01/2022 stable 10 mm low-density right adrenal nodule FINDINGS: Lower chest: Lung bases demonstrate no acute airspace disease. Hepatobiliary: Contracted gallbladder. No calcified stones. No focal hepatic abnormality allowing for absence of contrast. No biliary dilatation Pancreas: Unremarkable. No pancreatic ductal dilatation or surrounding inflammatory changes. Spleen: Slightly enlarged at 15 cm Adrenals/Urinary Tract: Thickened right adrenal gland with stable 10 mm low-density right adrenal nodules, series 2, image 27. Stable slightly thickened left adrenal gland without dominant mass. Status post left nephrectomy. Right renal cyst measuring up to 5 cm for which no specific imaging follow-up is recommended. New moderate right hydronephrosis and hydroureter. There is  mild right perinephric and Peri ureteral soft tissue stranding, but no obstructing stone is seen. The distal ureter in the pelvis is of more normal caliber. The bladder is unremarkable. Stomach/Bowel: The stomach is within normal limits. There is no dilated small bowel. No acute bowel wall thickening. Negative appendix. Vascular/Lymphatic: Mild aortic atherosclerosis. No aneurysmal dilatation. Small porta hepatis nodes measuring up to 9 mm. Abnormal ill-defined left para aortic soft tissue thickening measuring about 33 x 18 mm on series 2, image 30, with mild asymmetrical thickening of the left diaphragmatic crux. Abnormal soft tissue thickening in the left para aortic region extends inferiorly in the retroperitoneum to the level of the bifurcation. Suspicion of ill-defined soft tissue thickening at the bifurcation as well but limited without contrast. Small left inguinal nodes measuring up to 10 mm, mildly decreased. Reproductive: Negative prostate.  Moderate right scrotal hydrocele Other: Negative for pelvic effusion or free air Musculoskeletal: No acute or suspicious osseous abnormality IMPRESSION: 1. Status post left nephrectomy. New (compared with prior PET CT from May 2024 ) moderate right hydronephrosis and proximal hydroureter with mild right perinephric and periureteral soft tissue stranding. More normal caliber distal ureter. No obstructing ureteral stone. Possible source for obstruction could be abnormal residual soft tissue thickening or matted adenopathy within the retroperitoneum. 2. Status post left nephrectomy. Residual abnormal ill-defined left para aortic soft tissue thickening extending from just below the level of the left adrenal gland to the aortic bifurcation/proximal common iliac vessels. Suspicion of mild asymmetrical thickening or nodularity of the left diaphragmatic crux as well 3. Slight decreased left inguinal nodes. Few nonspecific porta hepatis nodes. 4. Stable splenomegaly. 5.  Moderate right-sided hydrocele 6. Aortic atherosclerosis. Aortic Atherosclerosis (ICD10-I70.0). Electronically Signed   By: Luke Bun M.D.   On: 03/01/2024 21:10   US  ABDOMEN LIMITED RUQ (LIVER/GB) Result Date: 02/29/2024 CLINICAL DATA:  Right upper quadrant abdominal pain EXAM: ULTRASOUND ABDOMEN LIMITED RIGHT UPPER QUADRANT COMPARISON:  None Available. FINDINGS: Gallbladder: No gallstones or wall thickening visualized. No sonographic Murphy sign noted by sonographer. Common bile duct: Diameter: 0.4 cm Liver: No focal lesion identified. Accentuated hepatic echogenicity, nonspecific although steatosis would be a common cause. Portal vein is patent on color Doppler imaging with normal direction of blood flow towards the liver. Other: Known simple right renal cyst again observed. No further imaging workup of this lesion is indicated. IMPRESSION: 1. No acute findings. No gallbladder wall thickening or biliary dilatation. 2. Accentuated hepatic echogenicity, nonspecific although steatosis would be a common cause. Electronically Signed   By: Ryan Salvage M.D.   On: 02/29/2024 08:33   US  RENAL Result Date: 02/23/2024 CLINICAL DATA:  Right flank pain and renal insufficiency. History of left nephrectomy in 2024. EXAM: RENAL / URINARY TRACT ULTRASOUND COMPLETE COMPARISON:  CTA abdomen 10/01/2022 FINDINGS: Right Kidney: Renal measurements: 16.5 x 7.9 x 7.4  cm = volume: 502 mL. Echogenicity within normal limits. Mildly dilated right renal collecting system. No visible calculi by ultrasound. Simple cyst of the posterior mid right kidney measures 5 cm in greatest diameter and has a benign appearance. Left Kidney: Surgically absent. Bladder: Appears normal for degree of bladder distention. Other: None. IMPRESSION: 1. Mild hydronephrosis of the right kidney by ultrasound. No visible calculi by ultrasound. Correlation suggested with urinalysis. CT of the abdomen and pelvis without contrast may be helpful if there is  any suspicion an obstructing ureteral calculus. 2. 5 cm simple cyst of the posterior mid right kidney. 3. Status post left nephrectomy. Electronically Signed   By: Marcey Moan M.D.   On: 02/23/2024 13:25   (Echo, Carotid, EGD, Colonoscopy, ERCP)    Subjective: Pt c/o intermittent bladder spasms   Discharge Exam: Vitals:   03/03/24 0356 03/03/24 0812  BP: 127/87 134/80  Pulse: 69 63  Resp: 16 18  Temp: 97.7 F (36.5 C) 98.4 F (36.9 C)  SpO2: 97% 98%   Vitals:   03/02/24 1814 03/02/24 1900 03/03/24 0356 03/03/24 0812  BP: 136/80 (!) 140/85 127/87 134/80  Pulse: 67 93 69 63  Resp: 16 16 16 18   Temp: 97.8 F (36.6 C) 98 F (36.7 C) 97.7 F (36.5 C) 98.4 F (36.9 C)  TempSrc:  Oral  Oral  SpO2: 98% 95% 97% 98%  Weight:      Height:        General: Pt is alert, awake, not in acute distress Cardiovascular: S1/S2 +, no rubs, no gallops Respiratory: CTA bilaterally, no wheezing, no rhonchi Abdominal: Soft, NT, ND, bowel sounds + Extremities: no edema, no cyanosis    The results of significant diagnostics from this hospitalization (including imaging, microbiology, ancillary and laboratory) are listed below for reference.     Microbiology: No results found for this or any previous visit (from the past 240 hours).   Labs: BNP (last 3 results) No results for input(s): BNP in the last 8760 hours. Basic Metabolic Panel: Recent Labs  Lab 03/01/24 0813 03/01/24 1848 03/02/24 0655 03/03/24 0306 03/03/24 1001  NA 142 141 141 139  --   K 4.5 4.5 4.5 5.2* 4.4  CL 106 107 109 107  --   CO2 24 24 23 23   --   GLUCOSE 190* 104* 112* 154*  --   BUN 41* 40* 39* 36*  --   CREATININE 2.91* 2.67* 2.52* 2.34*  --   CALCIUM 8.8 8.9 8.3* 8.7*  --    Liver Function Tests: No results for input(s): AST, ALT, ALKPHOS, BILITOT, PROT, ALBUMIN in the last 168 hours. No results for input(s): LIPASE, AMYLASE in the last 168 hours. No results for input(s):  AMMONIA in the last 168 hours. CBC: Recent Labs  Lab 03/01/24 1848 03/02/24 0655 03/03/24 0306  WBC 6.1 5.9 7.8  HGB 13.2 12.8* 12.9*  HCT 40.0 36.9* 38.4*  MCV 84.0 80.9 81.9  PLT 227 180 218   Cardiac Enzymes: No results for input(s): CKTOTAL, CKMB, CKMBINDEX, TROPONINI in the last 168 hours. BNP: Invalid input(s): POCBNP CBG: No results for input(s): GLUCAP in the last 168 hours. D-Dimer No results for input(s): DDIMER in the last 72 hours. Hgb A1c No results for input(s): HGBA1C in the last 72 hours. Lipid Profile No results for input(s): CHOL, HDL, LDLCALC, TRIG, CHOLHDL, LDLDIRECT in the last 72 hours. Thyroid  function studies No results for input(s): TSH, T4TOTAL, T3FREE, THYROIDAB in the last 72 hours.  Invalid input(s): FREET3 Anemia work up No results for input(s): VITAMINB12, FOLATE, FERRITIN, TIBC, IRON , RETICCTPCT in the last 72 hours. Urinalysis    Component Value Date/Time   COLORURINE STRAW (A) 03/01/2024 1847   APPEARANCEUR CLEAR (A) 03/01/2024 1847   APPEARANCEUR Clear 11/17/2022 1300   LABSPEC 1.008 03/01/2024 1847   PHURINE 5.0 03/01/2024 1847   GLUCOSEU NEGATIVE 03/01/2024 1847   GLUCOSEU NEGATIVE 04/29/2022 1202   HGBUR NEGATIVE 03/01/2024 1847   HGBUR negative 07/19/2010 1531   BILIRUBINUR NEGATIVE 03/01/2024 1847   BILIRUBINUR Negative 02/22/2024 1446   BILIRUBINUR Negative 11/17/2022 1300   KETONESUR NEGATIVE 03/01/2024 1847   PROTEINUR NEGATIVE 03/01/2024 1847   UROBILINOGEN 0.2 02/22/2024 1446   UROBILINOGEN 0.2 04/29/2022 1202   NITRITE NEGATIVE 03/01/2024 1847   LEUKOCYTESUR NEGATIVE 03/01/2024 1847   Sepsis Labs Recent Labs  Lab 03/01/24 1848 03/02/24 0655 03/03/24 0306  WBC 6.1 5.9 7.8   Microbiology No results found for this or any previous visit (from the past 240 hours).   Time coordinating discharge: 35 minutes  SIGNED:   Anthony CHRISTELLA Pouch, MD  [facility] 03/03/2024, 1:02 PM Pager   If 7PM-7AM, please contact night-coverage [email or web address]

## 2024-03-04 ENCOUNTER — Telehealth: Payer: Self-pay

## 2024-03-04 DIAGNOSIS — Z905 Acquired absence of kidney: Secondary | ICD-10-CM | POA: Insufficient documentation

## 2024-03-04 NOTE — Assessment & Plan Note (Addendum)
 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 He has high risk solitary kidney with evolving RPF/mass effect, now w/ obstructive mid ureteral narrowing  -Will place rereferral to [facility] - He has had robust workup, unclear what stage of diagnostics he is currently in, or if medical therapy has failed.  - Imperative he follow up with Urology in < 3 months for stent exchange. I will create follow up in 8 weeks in this clinic as a fail-safe--happy to exchange stent locally q35mo , pending overall care plan

## 2024-03-04 NOTE — Progress Notes (Unsigned)
   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 Carolee  Physical Exam: BP 130/85 (BP Location: Left Arm, Patient Position: Sitting, Cuff Size: Normal)   Pulse 87   Wt 220 lb (99.8 kg)   SpO2 96%   BMI 27.50 kg/m    Constitutional:  Alert and oriented, No acute distress.  GU: indwelling Foley catheter present, yellow urine in bag  Laboratory Data: Creatinine, Ser (03/08/24) 1.61 High   -- improved since admission (also better than historic ~2 baseline in recent months)  Pertinent Imaging: I have personally viewed and interpreted the CT Stone (03/01/24).   Assessment & Plan:    Solitary kidney, acquired Assessment & Plan: Solitary Right kidney S/p prior Left nephrectomy (benign inflammatory systemic mass/RPF)  Worsening Right RPF, extrinsic compression, AKI, hydronephrosis  S/p Right ureteral stent placement 03/02/24 2-3 cm mid ureteral narrowing  - Recommend consolidation of care at [facility]. He has high risk solitary kidney with evolving RPF/mass effect, now w/ obstructive mid ureteral narrowing   -Will place rereferral to [facility] Urology - He has had robust workup, unclear what stage of diagnostics he is currently in, or if medical therapy has failed.  - Imperative he follow up with Urology in < 3 months for stent exchange. I will create follow up in 8 weeks in this clinic as a fail-safe--happy to exchange stent locally q84mo , pending overall care plan  Orders: -     Ambulatory referral to Urology  Post-traumatic stricture of anterior urethra Assessment & Plan: Fossa navicularis stricture S/p sequential dilation in OR 03/02/24  Foley catheter removed this AM. Will follow expectantly for now. TOV this afternoon.  This may represent post instrumentation fossa stricture from repeat procedures versus prior traumatic catheterization.  I explained the pros/cons of intermittent dilation as a effective short-term strategy, although with expectedly higher recurrence rates.  If this becomes a frequent issue, he may need to be evaluated by a reconstructive urologist for formal repair /urethroplasty +/- graft.   TOV this afternoon - voided 200cc, PVR 1cc  Orders: -     Ambulatory referral to Urology       Keith JONELLE Skye, MD  [facility] [address] [place], [place] [address] [phone]

## 2024-03-04 NOTE — Telephone Encounter (Addendum)
 Hosp f/u scheduled 03/08/2024.  How is he feeling?  I think we can recheck labs at that time as long as he's feeling well.  TCM phone call performed today as well - see that phone note- ok to cancel CT scheduled for next week.  Does he want to schedule f/u at 7:30am on Friday instead of 11:30am? In case that works better for his work schedule - as I do have an opening at that time.

## 2024-03-04 NOTE — Telephone Encounter (Signed)
 Copied from CRM [id number]. Topic: Clinical - Request for Lab/Test Order >> Mar 04, 2024  8:50 AM Suzen RAMAN wrote: Reason for CRM: Patient was discharged from hospital and was advised to have lab work (KidneyFunction) complete to ensure stent is working properly. Please contact patient once orders are placed. Patient would also like to ensure that Dr. KANDICE reviews his discharge summary from the hospital.    CB#[phone] (M)    You want us  to call for office visit for hospital follow up right?

## 2024-03-04 NOTE — Transitions of Care (Post Inpatient/ED Visit) (Signed)
 03/04/2024  Name: Keith Bishop MRN: [id number] DOB: Nov 09, 1970  Today's TOC FU Call Status: Today's TOC FU Call Status:: Successful TOC FU Call Completed TOC FU Call Complete Date: 03/04/24 Patient's Name and Date of Birth confirmed.  Transition Care Management Follow-up Telephone Call Date of Discharge: 03/03/24 Discharge Facility: [facility]) Type of Discharge: Inpatient Admission Primary Inpatient Discharge Diagnosis:: Acute Unilateral Obstructive Uropathy How have you been since you were released from the hospital?: Better Any questions or concerns?: Yes Patient Questions/Concerns:: (S) Wondering if he should cancel CT ABD scan scheduled for 9/15 Patient Questions/Concerns Addressed: Notified Provider of Patient Questions/Concerns  Items Reviewed: Did you receive and understand the discharge instructions provided?: Yes Medications obtained,verified, and reconciled?: Yes (Medications Reviewed) Any new allergies since your discharge?: No Dietary orders reviewed?: NA Do you have support at home?: Yes People in Home [RPT]: spouse  Medications Reviewed Today: Medications Reviewed Today     Reviewed by Lavelle Charmaine NOVAK, LPN (Licensed Practical Nurse) on 03/04/24 at 1022  Med List Status: <None>   Medication Order Taking? Sig Documenting Provider Last Dose Status Informant  acetaminophen  (TYLENOL ) 500 MG tablet [id number] Yes Take 500 mg by mouth every 6 (six) hours as needed. [provider]  Active Spouse/Significant Other  amLODipine  (NORVASC ) 5 MG tablet [id number] Yes Take 1 tablet (5 mg total) by mouth daily. Rilla Baller, MD  Active Spouse/Significant Other  doxycycline  (VIBRA -TABS) 100 MG tablet [id number]  TAKE 1 TABLET (100 MG TOTAL) BY MOUTH DAILY. AS NEEDED FOR ACNE  Patient not taking: Reported on 03/04/2024   Rilla Baller, MD  Active Spouse/Significant Other  escitalopram  (LEXAPRO ) 5 MG tablet [id number] Yes TAKE 1  TABLET (5 MG TOTAL) BY MOUTH DAILY. Rilla Baller, MD  Active Spouse/Significant Other  metroNIDAZOLE  (METROGEL ) 0.75 % gel [id number] Yes Apply topically 2 (two) times daily. To affected area on face Rilla Baller, MD  Active Spouse/Significant Other  mirabegron  ER (MYRBETRIQ ) 50 MG TB24 tablet [id number] Yes Take 1 tablet (50 mg total) by mouth daily. Trudy Anthony HERO, MD  Active   Multiple Vitamins-Iron  (MULTIVITAMIN PLUS IRON  ADULT PO) [id number]  Take by mouth daily.  Patient not taking: Reported on 03/04/2024   [provider]  Active Spouse/Significant Other  oxyCODONE -acetaminophen  (PERCOCET/ROXICET) 5-325 MG tablet [id number] Yes Take 1 tablet by mouth every 6 (six) hours as needed for up to 5 days for moderate pain (pain score 4-6) or severe pain (pain score 7-10). Trudy Anthony HERO, MD  Active   tamsulosin  (FLOMAX ) 0.4 MG CAPS capsule [id number] Yes Take 1 capsule (0.4 mg total) by mouth daily. Trudy Anthony HERO, MD  Active             Home Care and Equipment/Supplies: Were Home Health Services Ordered?: NA Any new equipment or medical supplies ordered?: NA  Functional Questionnaire: Do you need assistance with bathing/showering or dressing?: No Do you need assistance with meal preparation?: No Do you need assistance with eating?: No Do you have difficulty maintaining continence: No (foley in place) Do you need assistance with getting out of bed/getting out of a chair/moving?: No Do you have difficulty managing or taking your medications?: No  Follow up appointments reviewed: PCP Follow-up appointment confirmed?: Yes Date of PCP follow-up appointment?: 03/08/24 Follow-up Provider: Dr. [facility] Follow-up appointment confirmed?: No Reason Specialist Follow-Up Not Confirmed: Patient has Specialist Provider Number and will Call for Appointment Do you need transportation to your follow-up appointment?: No Do you understand care  options if  your condition(s) worsen?: Yes-patient verbalized understanding    SIGNATURE Charmaine Bloodgood, LPN [facility] Health Advisor [place] [facility] You Are. We Are. [facility] Direct Dial [phone]

## 2024-03-04 NOTE — Telephone Encounter (Signed)
 Yes - ok to cancel CT scheduled for next week.

## 2024-03-05 NOTE — Telephone Encounter (Signed)
 Called patient reviewed all information and repeated back to me. Will call if any questions.  Patient states feeling ok. Would be better if he didn't have Catheter in. Have reviewed red words if any will give our office a call to be seen sooner.

## 2024-03-08 ENCOUNTER — Encounter: Payer: Self-pay | Admitting: *Deleted

## 2024-03-08 ENCOUNTER — Encounter: Payer: Self-pay | Admitting: Family Medicine

## 2024-03-08 ENCOUNTER — Ambulatory Visit: Admitting: Family Medicine

## 2024-03-08 VITALS — BP 120/82 | HR 76 | Temp 98.3°F | Ht 75.0 in | Wt 214.0 lb

## 2024-03-08 DIAGNOSIS — N189 Chronic kidney disease, unspecified: Secondary | ICD-10-CM

## 2024-03-08 DIAGNOSIS — N139 Obstructive and reflux uropathy, unspecified: Secondary | ICD-10-CM | POA: Diagnosis not present

## 2024-03-08 DIAGNOSIS — K682 Retroperitoneal fibrosis: Secondary | ICD-10-CM | POA: Diagnosis not present

## 2024-03-08 DIAGNOSIS — N179 Acute kidney failure, unspecified: Secondary | ICD-10-CM | POA: Diagnosis not present

## 2024-03-08 LAB — RENAL FUNCTION PANEL
Albumin: 4.6 g/dL (ref 3.5–5.2)
BUN: 22 mg/dL (ref 6–23)
CO2: 29 meq/L (ref 19–32)
Calcium: 9.9 mg/dL (ref 8.4–10.5)
Chloride: 102 meq/L (ref 96–112)
Creatinine, Ser: 1.61 mg/dL — ABNORMAL HIGH (ref 0.40–1.50)
GFR: 48.23 mL/min — ABNORMAL LOW (ref >60.00)
Glucose, Bld: 105 mg/dL — ABNORMAL HIGH (ref 70–99)
Phosphorus: 3.3 mg/dL (ref 2.3–4.6)
Potassium: 4.5 meq/L (ref 3.5–5.1)
Sodium: 141 meq/L (ref 135–145)

## 2024-03-08 NOTE — Assessment & Plan Note (Addendum)
 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 Will update renal panel today.  Cellcept  may have slowed progression previously, but off this for several months.  Did not tolerate prednisone  well - leading to severe anxiety.  Will see if we can expedite [facility] rheumatology f/u. Unsure if they ever received response from [facility] clinic re:2nd opinion.

## 2024-03-08 NOTE — Assessment & Plan Note (Signed)
 Will need catheter removal followed by voiding trial next week.  He has urology f/u scheduled next week in [place] - however states [place] is preferred location. Will place stat [facility] referral for preferred [facility] location.

## 2024-03-08 NOTE — Patient Instructions (Addendum)
 Labs today  I will place an urgent referral to [facility] rheumatology to see if you can be seen any sooner.  Keep urology appointment I can fill out any FMLA forms necessary.  Let us  know if fever, worsening pain, new symptoms develop.  I will see if we can get you in with [facility] in [place] which is closer for you

## 2024-03-08 NOTE — Assessment & Plan Note (Signed)
Update renal panel 

## 2024-03-08 NOTE — Progress Notes (Signed)
 Ph: [phone] Fax: [phone]   Patient ID: Keith Bishop, male    DOB: Jan 02, 1971, 53 y.o.   MRN: [id number]  This visit was conducted in person.  BP 120/82   Pulse 76   Temp 98.3 F (36.8 C) (Oral)   Ht 6' 3 (1.905 m)   Wt 214 lb (97.1 kg)   SpO2 97%   BMI 26.75 kg/m    CC: hospital f/u visit  Subjective:   HPI: Keith Bishop is a 53 y.o. male presenting on 03/08/2024 for Hospitalization Follow-up   See prior note for details.  [facility] uro-oncology Dr Texie s/p robotic-assisted laparoscopic left radical nephrectomy and adrenalectomy on January 13, 2023. Left kidney pathology showed atypical lymphoid infiltrate with scattered necrotizing and nonnecrotizing granulomas, lymph node pathology showed fibrous tissue with scattered lymphoid aggregates.   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 Pathology slides from nephrectomy were sent to [facility] for 2nd opinion - I don't see results of this.   Recent hospitalization for acute unilateral obstructive uropathy with CT scan showing new moderate R hydronephrosis and proximal hydroureter with mild R perinephric and periureteral soft tissue stranding, no obstructing ureteral stone. Obstruction thought due to abnormal residual soft tissue thickening within the retroperitoneum. Treated with cystoscopy and urethral dilation/R renal stent placement by Dr Carolee urology. Found to hav efossa navicularis stricture. Started on flomax  and mirabegron .  Drains/Catheters: 1.  18 French Foley catheter 2.  6 x 26 double-J ureteral stent  Hospital records reviewed. Med rec performed.  Rec stent exchange in 3 months.    He's been using miralax  stool softener to avoid constipation.  Occ nausea.  No fevers/chills, chest pain, dyspnea, cough, vomiting, diarrhea.   Off cellcept  since early 2025  Currently out of work.   Home health not set up.  Other follow up appointments scheduled:  [facility] Dr Penne Skye 03/13/2024 [facility] Rheumatology Dr Mardeen Georges 10/10/025 _____________________________________________________________________ Hospital admission: 03/01/2024 Hospital discharge: 03/03/2024 TCM f/u phone call:  performed 03/04/2024  Recommendations for Outpatient Follow-up:  Follow up with PCP in 1-2 weeks F/u w/ uro, Dr. Carolee, in 5-7 days for voiding trial F/u w/ [facility] rheum, uro & onco for further workup on retroperitoneal fibroproliferative disorder   Discharge Diagnoses:  Principal Problem:   Acute unilateral obstructive uropathy Active Problems:   Acute kidney injury superimposed on chronic kidney disease (HCC)   Essential hypertension   Anxiety and depression   GERD without esophagitis     Relevant past medical, surgical, family and social history reviewed and updated as indicated. Interim medical history since our last visit reviewed. Allergies and medications reviewed and updated. Outpatient Medications Prior to Visit  Medication Sig Dispense Refill   acetaminophen  (TYLENOL ) 500 MG tablet Take 500 mg by mouth every 6 (six) hours as needed.     amLODipine  (NORVASC ) 5 MG tablet Take 1 tablet (5 mg total) by mouth daily. 90 tablet 4   doxycycline  (VIBRA -TABS) 100 MG tablet TAKE 1 TABLET (100 MG TOTAL) BY MOUTH DAILY. AS NEEDED FOR ACNE 30 tablet 1   escitalopram  (LEXAPRO ) 5 MG tablet TAKE 1 TABLET (5 MG TOTAL) BY MOUTH DAILY. 90 tablet 3   metroNIDAZOLE  (METROGEL ) 0.75 % gel Apply topically 2 (two) times daily. To affected area on  face 45 g 0   mirabegron  ER (MYRBETRIQ ) 50 MG TB24 tablet Take 1 tablet (50 mg total) by mouth daily. 30 tablet 0   Multiple Vitamins-Iron  (MULTIVITAMIN  PLUS IRON  ADULT PO) Take by mouth daily.     oxyCODONE -acetaminophen  (PERCOCET/ROXICET) 5-325 MG tablet Take 1 tablet by mouth every 6 (six) hours as needed for up to 5 days for moderate pain (pain score 4-6) or severe pain (pain score 7-10). 20 tablet 0   tamsulosin  (FLOMAX ) 0.4 MG CAPS capsule Take 1 capsule (0.4 mg total) by mouth daily. 30 capsule 0   No facility-administered medications prior to visit.     Per HPI unless specifically indicated in ROS section below Review of Systems  Objective:  BP 120/82   Pulse 76   Temp 98.3 F (36.8 C) (Oral)   Ht 6' 3 (1.905 m)   Wt 214 lb (97.1 kg)   SpO2 97%   BMI 26.75 kg/m   Wt Readings from Last 3 Encounters:  03/08/24 214 lb (97.1 kg)  03/02/24 220 lb (99.8 kg)  02/22/24 221 lb (100.2 kg)      Physical Exam Vitals and nursing note reviewed.  Constitutional:      Appearance: Normal appearance. He is not ill-appearing.  HENT:     Head: Normocephalic and atraumatic.     Mouth/Throat:     Mouth: Mucous membranes are moist.     Pharynx: Oropharynx is clear. No oropharyngeal exudate or posterior oropharyngeal erythema.  Eyes:     Extraocular Movements: Extraocular movements intact.     Conjunctiva/sclera: Conjunctivae normal.     Pupils: Pupils are equal, round, and reactive to light.  Cardiovascular:     Rate and Rhythm: Normal rate and regular rhythm.     Pulses: Normal pulses.     Heart sounds: Normal heart sounds. No murmur heard. Pulmonary:     Effort: Pulmonary effort is normal. No respiratory distress.     Breath sounds: Normal breath sounds. No wheezing, rhonchi or rales.  Abdominal:     General: Bowel sounds are normal. There is no distension.     Palpations: Abdomen is soft. There is no mass.     Tenderness: There is no abdominal tenderness. There is no right CVA tenderness, left CVA tenderness, guarding or rebound.     Hernia: No hernia is present.  Genitourinary:    Comments:  Foley catheter in place with bag   Musculoskeletal:     Right lower leg: No edema.     Left lower leg: No edema.  Skin:    General: Skin is warm and dry.     Comments: Vitiligo present  Neurological:     Mental Status: He is alert.  Psychiatric:        Mood and Affect: Mood normal.        Behavior: Behavior normal.       Results for orders placed or performed during the hospital encounter of 03/01/24  Urinalysis, Routine w reflex microscopic -Urine, Random   Collection Time: 03/01/24  6:47 PM  Result Value Ref Range   Color, Urine STRAW (A) YELLOW   APPearance CLEAR (A) CLEAR   Specific Gravity, Urine 1.008 1.005 - 1.030   pH 5.0 5.0 - 8.0   Glucose, UA NEGATIVE NEGATIVE mg/dL   Hgb urine dipstick NEGATIVE NEGATIVE   Bilirubin Urine NEGATIVE NEGATIVE   Ketones, ur NEGATIVE NEGATIVE mg/dL   Protein, ur NEGATIVE NEGATIVE mg/dL   Nitrite NEGATIVE NEGATIVE   Leukocytes,Ua  NEGATIVE NEGATIVE  CBC   Collection Time: 03/01/24  6:48 PM  Result Value Ref Range   WBC 6.1 4.0 - 10.5 K/uL   RBC 4.76 4.22 - 5.81 MIL/uL   Hemoglobin 13.2 13.0 - 17.0 g/dL   HCT 59.9 60.9 - 47.9 %   MCV 84.0 80.0 - 100.0 fL   MCH 27.7 26.0 - 34.0 pg   MCHC 33.0 30.0 - 36.0 g/dL   RDW 88.0 88.4 - 84.4 %   Platelets 227 150 - 400 K/uL   nRBC 0.0 0.0 - 0.2 %  Basic metabolic panel   Collection Time: 03/01/24  6:48 PM  Result Value Ref Range   Sodium 141 135 - 145 mmol/L   Potassium 4.5 3.5 - 5.1 mmol/L   Chloride 107 98 - 111 mmol/L   CO2 24 22 - 32 mmol/L   Glucose, Bld 104 (H) 70 - 99 mg/dL   BUN 40 (H) 6 - 20 mg/dL   Creatinine, Ser 7.32 (H) 0.61 - 1.24 mg/dL   Calcium 8.9 8.9 - 89.6 mg/dL   GFR, Estimated 28 (L) >60 mL/min   Anion gap 10 5 - 15  HIV Antibody (routine testing w rflx)   Collection Time: 03/02/24  6:55 AM  Result Value Ref Range   HIV Screen 4th Generation wRfx Non Reactive Non Reactive  Basic metabolic panel   Collection Time: 03/02/24  6:55 AM  Result Value Ref Range   Sodium 141 135 - 145 mmol/L    Potassium 4.5 3.5 - 5.1 mmol/L   Chloride 109 98 - 111 mmol/L   CO2 23 22 - 32 mmol/L   Glucose, Bld 112 (H) 70 - 99 mg/dL   BUN 39 (H) 6 - 20 mg/dL   Creatinine, Ser 7.47 (H) 0.61 - 1.24 mg/dL   Calcium 8.3 (L) 8.9 - 10.3 mg/dL   GFR, Estimated 30 (L) >60 mL/min   Anion gap 9 5 - 15  CBC   Collection Time: 03/02/24  6:55 AM  Result Value Ref Range   WBC 5.9 4.0 - 10.5 K/uL   RBC 4.56 4.22 - 5.81 MIL/uL   Hemoglobin 12.8 (L) 13.0 - 17.0 g/dL   HCT 63.0 (L) 60.9 - 47.9 %   MCV 80.9 80.0 - 100.0 fL   MCH 28.1 26.0 - 34.0 pg   MCHC 34.7 30.0 - 36.0 g/dL   RDW 88.0 88.4 - 84.4 %   Platelets 180 150 - 400 K/uL   nRBC 0.0 0.0 - 0.2 %  CBC   Collection Time: 03/03/24  3:06 AM  Result Value Ref Range   WBC 7.8 4.0 - 10.5 K/uL   RBC 4.69 4.22 - 5.81 MIL/uL   Hemoglobin 12.9 (L) 13.0 - 17.0 g/dL   HCT 61.5 (L) 60.9 - 47.9 %   MCV 81.9 80.0 - 100.0 fL   MCH 27.5 26.0 - 34.0 pg   MCHC 33.6 30.0 - 36.0 g/dL   RDW 88.0 88.4 - 84.4 %   Platelets 218 150 - 400 K/uL   nRBC 0.0 0.0 - 0.2 %  Basic metabolic panel with GFR   Collection Time: 03/03/24  3:06 AM  Result Value Ref Range   Sodium 139 135 - 145 mmol/L   Potassium 5.2 (H) 3.5 - 5.1 mmol/L   Chloride 107 98 - 111 mmol/L   CO2 23 22 - 32 mmol/L   Glucose, Bld 154 (H) 70 - 99 mg/dL   BUN 36 (H) 6 - 20 mg/dL  Creatinine, Ser 2.34 (H) 0.61 - 1.24 mg/dL   Calcium 8.7 (L) 8.9 - 10.3 mg/dL   GFR, Estimated 32 (L) >60 mL/min   Anion gap 9 5 - 15  Potassium   Collection Time: 03/03/24 10:01 AM  Result Value Ref Range   Potassium 4.4 3.5 - 5.1 mmol/L    Assessment & Plan:   Problem List Items Addressed This Visit     Retroperitoneal fibrosis (Chronic)   Complicated picture - 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 Will update renal panel today.  Cellcept  may have slowed progression previously, but off this for several months.  Did not tolerate prednisone  well - leading to severe anxiety.  Will see if we can expedite [facility] rheumatology f/u. Unsure if they ever received response from [facility] clinic re:2nd opinion.       Relevant Orders   Ambulatory referral to Rheumatology   Acute unilateral obstructive uropathy - Primary   Will need catheter removal followed by voiding trial next week.  He has urology f/u scheduled next week in [place] - however states [place] is preferred location. Will place stat [facility] referral for preferred [facility] location.      Relevant Orders   Renal function panel   Ambulatory referral to Urology   Ambulatory referral to Rheumatology   Acute kidney injury superimposed on chronic kidney disease (HCC)   Update renal panel.       Relevant Orders   Renal function panel   Ambulatory referral to Urology   Ambulatory referral to Rheumatology     No orders of the defined types were placed in this encounter.   Orders Placed This Encounter  Procedures   Renal function panel   Ambulatory referral to Urology    Referral Priority:   Urgent    Referral Type:   Consultation    Referral Reason:   Specialty Services Required    Requested Specialty:   Urology    Number of Visits Requested:   1   Ambulatory referral to Rheumatology    Referral Priority:   Urgent    Referral Type:   Consultation    Referral Reason:   Specialty Services Required    Requested Specialty:   Rheumatology    Number of Visits Requested:   1    Patient Instructions  Labs today  I will place an urgent referral to [facility] rheumatology to see if you can be seen any sooner.  Keep urology appointment I can fill out any FMLA forms necessary.  Let us  know if fever, worsening pain, new symptoms develop.  I will see if we can get you in with [facility] in  [place] which is closer for you  Follow up plan: Return if symptoms worsen or fail to improve.  Anton Blas, MD

## 2024-03-09 ENCOUNTER — Other Ambulatory Visit: Payer: Self-pay | Admitting: Family Medicine

## 2024-03-11 ENCOUNTER — Other Ambulatory Visit

## 2024-03-11 ENCOUNTER — Ambulatory Visit: Payer: Self-pay | Admitting: Family Medicine

## 2024-03-11 DIAGNOSIS — N139 Obstructive and reflux uropathy, unspecified: Secondary | ICD-10-CM

## 2024-03-11 DIAGNOSIS — N179 Acute kidney failure, unspecified: Secondary | ICD-10-CM

## 2024-03-13 ENCOUNTER — Ambulatory Visit: Admitting: Urology

## 2024-03-13 VITALS — BP 124/83 | HR 114 | Wt 220.0 lb

## 2024-03-13 DIAGNOSIS — Z905 Acquired absence of kidney: Secondary | ICD-10-CM | POA: Diagnosis not present

## 2024-03-13 DIAGNOSIS — N35013 Post-traumatic anterior urethral stricture: Secondary | ICD-10-CM

## 2024-03-13 LAB — BLADDER SCAN AMB NON-IMAGING: Scan Result: 1

## 2024-03-13 NOTE — Assessment & Plan Note (Addendum)
 Fossa navicularis stricture S/p sequential dilation in OR 03/02/24  Foley catheter removed this AM. Will follow expectantly for now. TOV this afternoon.  This may represent post instrumentation fossa stricture from repeat procedures versus prior traumatic catheterization.  I explained the pros/cons of intermittent dilation as a effective short-term strategy, although with expectedly higher recurrence rates.  If this becomes a frequent issue, he may need to be evaluated by a reconstructive urologist for formal repair /urethroplasty +/- graft.   TOV this afternoon - voided 200cc, PVR 1cc

## 2024-03-13 NOTE — Progress Notes (Unsigned)
 Catheter Removal  Patient is present today for a catheter removal.  10 ml of water  was drained from the balloon. A 18 FR foley cath was removed from the bladder, no complications were noted. Patient tolerated well.  Performed by: Cabria Micalizzi Magallon-Mariche,RMA  Follow up/ Additional notes: Voided 200 ml PVR 1 ml

## 2024-03-13 NOTE — Patient Instructions (Signed)
 Please continue Flomax  and Myrbetriq  while stent is in place.

## 2024-03-15 ENCOUNTER — Telehealth: Payer: Self-pay | Admitting: Family Medicine

## 2024-03-15 NOTE — Telephone Encounter (Signed)
 Type of forms received: FMLA  Routed to: Provider box  Paperwork received by :  [employer] office  Individual made aware of 3-5 business day turn around Y:  Form completed and patient made aware of charges(Y/N):   Faxed to :   Form location:   Fax it to your employer and patient is also requesting a copy Advise to put condition is indefinitely (a year out same as last time)  Patient asked to date it back to Saturday he was in the hospital so that it covers his hospital stay, 9.5.25 admission

## 2024-03-18 NOTE — Telephone Encounter (Signed)
 FMLA for Patient forms received for completion for patient. Patient has been informed that process may take up to 5 business days.  Employer Name [employer] Reason for being out: Ongoing health issue Any inpatient care: 9.5.25 Any Planned appointment? 11.12.25 Urologist, future appts TBD  Patient is requesting 1-2 days per week lasting 8 hours per episode.  Verified with patient that it is ok to leave Voicemail updates on  Mobile [phone] (mobile)  Patient would like to pick up copy in our office when form is completed.   Fax number form should be sent to is [phone]  Forms placed in providers box for review

## 2024-03-21 NOTE — Telephone Encounter (Signed)
 Forms filled out and will be placed in Keith Bishop's box tomorrow when I'm back in office.

## 2024-03-22 NOTE — Telephone Encounter (Signed)
 Completed forms received and faxed to [phone]  Copy sent to scan  Pt notified via Mychart that a copy for their records was ready to pick up at the front desk when it is convenient for them.

## 2024-03-27 NOTE — Telephone Encounter (Signed)
 Per Rheum note: - Check kidney function today and every 2-3 weeks. Standing order sent to PCP.   Standing order order for BMP.   Would fax results attn: Roylene Nat Jansky, DO  [address]  [facility] Division of Rheumatology, Allergy, Immunology  [place], [place] [address]  Phone: [phone]  fax:[phone]

## 2024-04-04 ENCOUNTER — Other Ambulatory Visit (INDEPENDENT_AMBULATORY_CARE_PROVIDER_SITE_OTHER)

## 2024-04-04 ENCOUNTER — Encounter: Payer: Self-pay | Admitting: Family Medicine

## 2024-04-04 DIAGNOSIS — N179 Acute kidney failure, unspecified: Secondary | ICD-10-CM

## 2024-04-04 DIAGNOSIS — N189 Chronic kidney disease, unspecified: Secondary | ICD-10-CM

## 2024-04-04 DIAGNOSIS — N139 Obstructive and reflux uropathy, unspecified: Secondary | ICD-10-CM | POA: Diagnosis not present

## 2024-04-04 LAB — BASIC METABOLIC PANEL WITH GFR
BUN: 21 mg/dL (ref 6–23)
CO2: 29 meq/L (ref 19–32)
Calcium: 9 mg/dL (ref 8.4–10.5)
Chloride: 100 meq/L (ref 96–112)
Creatinine, Ser: 1.2 mg/dL (ref 0.40–1.50)
GFR: 68.59 mL/min (ref >60.00)
Glucose, Bld: 200 mg/dL — ABNORMAL HIGH (ref 70–99)
Potassium: 4 meq/L (ref 3.5–5.1)
Sodium: 138 meq/L (ref 135–145)

## 2024-04-08 ENCOUNTER — Ambulatory Visit: Payer: Self-pay | Admitting: Family Medicine

## 2024-04-08 DIAGNOSIS — N139 Obstructive and reflux uropathy, unspecified: Secondary | ICD-10-CM

## 2024-04-08 DIAGNOSIS — N179 Acute kidney failure, unspecified: Secondary | ICD-10-CM

## 2024-04-08 DIAGNOSIS — E1169 Type 2 diabetes mellitus with other specified complication: Secondary | ICD-10-CM | POA: Insufficient documentation

## 2024-04-08 DIAGNOSIS — R739 Hyperglycemia, unspecified: Secondary | ICD-10-CM | POA: Insufficient documentation

## 2024-04-08 MED ORDER — MIRABEGRON ER 50 MG PO TB24: 50.0000 mg | ORAL_TABLET | Freq: Every day | ORAL | 0 refills | Status: DC

## 2024-04-08 MED ORDER — TAMSULOSIN HCL 0.4 MG PO CAPS: 0.4000 mg | ORAL_CAPSULE | Freq: Every day | ORAL | 3 refills | Status: DC

## 2024-04-22 ENCOUNTER — Other Ambulatory Visit (INDEPENDENT_AMBULATORY_CARE_PROVIDER_SITE_OTHER)

## 2024-04-22 DIAGNOSIS — N139 Obstructive and reflux uropathy, unspecified: Secondary | ICD-10-CM | POA: Diagnosis not present

## 2024-04-22 DIAGNOSIS — N189 Chronic kidney disease, unspecified: Secondary | ICD-10-CM | POA: Diagnosis not present

## 2024-04-22 DIAGNOSIS — N179 Acute kidney failure, unspecified: Secondary | ICD-10-CM

## 2024-04-22 LAB — BASIC METABOLIC PANEL WITH GFR
BUN: 20 mg/dL (ref 6–23)
CO2: 29 meq/L (ref 19–32)
Calcium: 8.6 mg/dL (ref 8.4–10.5)
Chloride: 101 meq/L (ref 96–112)
Creatinine, Ser: 1.19 mg/dL (ref 0.40–1.50)
GFR: 69.26 mL/min (ref >60.00)
Glucose, Bld: 247 mg/dL — ABNORMAL HIGH (ref 70–99)
Potassium: 3.9 meq/L (ref 3.5–5.1)
Sodium: 139 meq/L (ref 135–145)

## 2024-04-23 ENCOUNTER — Ambulatory Visit: Payer: Self-pay | Admitting: Family Medicine

## 2024-05-08 ENCOUNTER — Telehealth: Payer: Self-pay | Admitting: Family Medicine

## 2024-05-08 ENCOUNTER — Ambulatory Visit: Admitting: Urology

## 2024-05-08 DIAGNOSIS — R739 Hyperglycemia, unspecified: Secondary | ICD-10-CM

## 2024-05-08 NOTE — Addendum Note (Signed)
 Addended by: RILLA BALLER on: 05/08/2024 04:50 PM   Modules accepted: Orders

## 2024-05-08 NOTE — Telephone Encounter (Signed)
 Copied from CRM [id number]. Topic: Clinical - Request for Lab/Test Order >> May 08, 2024  9:01 AM Mia F wrote: Reason for CRM: Pt called stating he should have a standing order and he would like to come in to get labs done today. Order is not in chart. Please place order or call pt to let him know what he can do

## 2024-05-08 NOTE — Telephone Encounter (Addendum)
 He does have standing orders already in the chart. You have to look under order review section of chart.  He may schedule at his convenience.   Given high sugar readings, I have also ordered A1c.  When he comes in he will need to ask for both standing orders and future orders labs drawn.

## 2024-05-09 NOTE — Telephone Encounter (Signed)
Please call patient for lab appointment  

## 2024-05-10 ENCOUNTER — Telehealth: Payer: Self-pay

## 2024-05-10 ENCOUNTER — Ambulatory Visit: Admitting: Family Medicine

## 2024-05-10 ENCOUNTER — Encounter: Payer: Self-pay | Admitting: Family Medicine

## 2024-05-10 ENCOUNTER — Other Ambulatory Visit (HOSPITAL_COMMUNITY): Payer: Self-pay

## 2024-05-10 VITALS — BP 130/88 | HR 77 | Temp 98.5°F | Ht 75.0 in | Wt 213.1 lb

## 2024-05-10 DIAGNOSIS — K056 Periodontal disease, unspecified: Secondary | ICD-10-CM

## 2024-05-10 DIAGNOSIS — N189 Chronic kidney disease, unspecified: Secondary | ICD-10-CM

## 2024-05-10 DIAGNOSIS — E1169 Type 2 diabetes mellitus with other specified complication: Secondary | ICD-10-CM

## 2024-05-10 DIAGNOSIS — K682 Retroperitoneal fibrosis: Secondary | ICD-10-CM

## 2024-05-10 DIAGNOSIS — Z905 Acquired absence of kidney: Secondary | ICD-10-CM

## 2024-05-10 DIAGNOSIS — N139 Obstructive and reflux uropathy, unspecified: Secondary | ICD-10-CM

## 2024-05-10 DIAGNOSIS — Z7984 Long term (current) use of oral hypoglycemic drugs: Secondary | ICD-10-CM

## 2024-05-10 DIAGNOSIS — R739 Hyperglycemia, unspecified: Secondary | ICD-10-CM

## 2024-05-10 DIAGNOSIS — N179 Acute kidney failure, unspecified: Secondary | ICD-10-CM

## 2024-05-10 LAB — BASIC METABOLIC PANEL WITH GFR
BUN: 16 mg/dL (ref 6–23)
CO2: 29 meq/L (ref 19–32)
Calcium: 9 mg/dL (ref 8.4–10.5)
Chloride: 103 meq/L (ref 96–112)
Creatinine, Ser: 1.2 mg/dL (ref 0.40–1.50)
GFR: 68.55 mL/min (ref >60.00)
Glucose, Bld: 180 mg/dL — ABNORMAL HIGH (ref 70–99)
Potassium: 3.9 meq/L (ref 3.5–5.1)
Sodium: 140 meq/L (ref 135–145)

## 2024-05-10 LAB — POCT GLYCOSYLATED HEMOGLOBIN (HGB A1C): Hemoglobin A1C: 9.4 % — AB (ref 4.0–5.6)

## 2024-05-10 MED ORDER — BLOOD GLUCOSE TEST VI STRP: 1.0000 | ORAL_STRIP | 0 refills | Status: DC

## 2024-05-10 MED ORDER — GLIPIZIDE 5 MG PO TABS: 5.0000 mg | ORAL_TABLET | Freq: Every day | ORAL | 1 refills | Status: DC

## 2024-05-10 MED ORDER — LANCETS MISC
1.0000 | 0 refills | Status: AC
Start: 2024-05-10 — End: ?

## 2024-05-10 MED ORDER — SOLIFENACIN SUCCINATE 5 MG PO TABS: 5.0000 mg | ORAL_TABLET | Freq: Every day | ORAL | 0 refills | Status: AC

## 2024-05-10 MED ORDER — LANCET DEVICE MISC
1.0000 | 0 refills | Status: AC
Start: 2024-05-10 — End: ?

## 2024-05-10 MED ORDER — BLOOD GLUCOSE MONITORING SUPPL DEVI: 1.0000 | 0 refills | Status: DC

## 2024-05-10 MED ORDER — AMOXICILLIN 875 MG PO TABS: 875.0000 mg | ORAL_TABLET | Freq: Two times a day (BID) | ORAL | 0 refills | Status: AC

## 2024-05-10 NOTE — Telephone Encounter (Signed)
 Pharmacy Patient Advocate Encounter   Received notification from Onbase that prior authorization for Lancets 30 G is required/requested.   Insurance verification completed.   The patient is insured through [facility]   Per test claim: The current 90 day co-pay is, $0.00.  No PA needed at this time. This test claim was processed through [facility]- copay amounts may vary at other pharmacies due to pharmacy/plan contracts, or as the patient moves through the different stages of their insurance plan.     Spoke with [facility] to verify brand, Accu-Chek lancets, meter and test strips are getting ready for patient pickup. No PA needed.

## 2024-05-10 NOTE — Assessment & Plan Note (Signed)
 Reviewed new diagnosis - anticipate steroid-induced.  Provided with diabetes nutrition handout. Declines DSME.  Discussed glycemic goals/range.  Start glipizide 5mg  daily with breakfast.  Reassess at 3 mo control - he notes he is planning to stop methylprednisolone use next week.

## 2024-05-10 NOTE — Progress Notes (Signed)
 Ph: [phone] Fax: [phone]   Patient ID: Keith Bishop, male    DOB: 1970-10-08, 53 y.o.   MRN: [id number]  This visit was conducted in person.  BP 130/88   Pulse 77   Temp 98.5 F (36.9 C) (Oral)   Ht 6' 3 (1.905 m)   Wt 213 lb 2 oz (96.7 kg)   SpO2 98%   BMI 26.64 kg/m    CC: discuss meds and diabetes Subjective:   HPI: Keith Bishop is a 53 y.o. male presenting on 05/10/2024 for Medical Management of Chronic Issues (Pt is here to discuss lab results and medication)   Planning road trip to [place] for Thanksgiving  See prior notes for details.  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 Left kidney pathology showed atypical lymphoid infiltrate with scattered necrotizing and nonnecrotizing granulomas, lymph node pathology showed fibrous tissue with scattered lymphoid aggregates.   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   Saw urology, started Vesicare with improvement in polyuria. Ongoing urgency/frequency without dysuria R ureteral stent remains with stent exchange q3-4 months.  Off flomax , off mirabegron .   Currently on 4mg  methylprednisolone for the next 1 week then stop.  Also on cellcept  1500mg  bid and humira.  Planned rpt imaging 06/2024.   Recent root canal with amoxicillin  course - ongoing tooth ache.   DM - diagnosis based on elevated sugars to 200s in setting of methylprednisolone use. Update A1c today. Does not regularly check sugars. Compliant with antihyperglycemic regimen which includes: diet controlled. Denies low sugars or hypoglycemic symptoms. Denies paresthesias, blurry vision. Last diabetic eye exam not due. Glucometer brand: doesn't have one. Last foot exam: not done. DSME: declined. Lab Results   Component Value Date   HGBA1C 9.4 (A) 05/10/2024   Diabetic Foot Exam - Simple   No data filed    Lab Results  Component Value Date   MICROALBUR 0.2 02/27/2009        Relevant past medical, surgical, family and social history reviewed and updated as indicated. Interim medical history since our last visit reviewed. Allergies and medications reviewed and updated. Outpatient Medications Prior to Visit  Medication Sig Dispense Refill   Adalimumab (HUMIRA, 1 PEN, [place]) Inject 0.4 mLs into the skin every 14 (fourteen) days.     methylPREDNISolone (MEDROL) 4 MG tablet Take 4 mg by mouth daily.     mycophenolate  (CELLCEPT ) 500 MG tablet Take 1,500 mg by mouth.     solifenacin (VESICARE) 5 MG tablet Take 5 mg by mouth daily.     acetaminophen  (TYLENOL ) 500 MG tablet Take 500 mg by mouth every 6 (six) hours as needed.     amLODipine  (NORVASC ) 5 MG tablet TAKE 1 TABLET (5 MG TOTAL) BY MOUTH DAILY. 90 tablet 3   escitalopram  (LEXAPRO ) 5 MG tablet TAKE 1 TABLET (5 MG TOTAL) BY MOUTH DAILY. 90 tablet 3   mycophenolate  (CELLCEPT ) 500 MG tablet Take 3 tablets (1,500 mg total) by mouth 2 (two) times daily.     doxycycline  (VIBRA -TABS) 100 MG tablet TAKE 1 TABLET (100 MG TOTAL) BY MOUTH DAILY. AS NEEDED FOR ACNE 30 tablet 1   metroNIDAZOLE  (METROGEL ) 0.75 % gel Apply topically 2 (two) times daily. To affected area on face (Patient not taking: Reported on 05/10/2024) 45 g 0   mirabegron  ER (MYRBETRIQ ) 50 MG TB24 tablet  Take 1 tablet (50 mg total) by mouth daily. 90 tablet 0   Multiple Vitamins-Iron  (MULTIVITAMIN PLUS IRON  ADULT PO) Take by mouth daily. (Patient not taking: Reported on 05/10/2024)     tamsulosin  (FLOMAX ) 0.4 MG CAPS capsule Take 1 capsule (0.4 mg total) by mouth daily. (Patient not taking: Reported on 05/10/2024) 30 capsule 3   No facility-administered medications prior to visit.     Per HPI unless specifically indicated in ROS section below Review of Systems  Objective:  BP 130/88    Pulse 77   Temp 98.5 F (36.9 C) (Oral)   Ht 6' 3 (1.905 m)   Wt 213 lb 2 oz (96.7 kg)   SpO2 98%   BMI 26.64 kg/m   Wt Readings from Last 3 Encounters:  05/10/24 213 lb 2 oz (96.7 kg)  03/13/24 220 lb (99.8 kg)  03/08/24 214 lb (97.1 kg)      Physical Exam Vitals and nursing note reviewed.  Constitutional:      Appearance: Normal appearance. He is not ill-appearing.  HENT:     Head: Normocephalic and atraumatic.     Mouth/Throat:     Mouth: Mucous membranes are moist.     Pharynx: Oropharynx is clear. No oropharyngeal exudate or posterior oropharyngeal erythema.  Eyes:     Extraocular Movements: Extraocular movements intact.     Pupils: Pupils are equal, round, and reactive to light.  Cardiovascular:     Rate and Rhythm: Normal rate and regular rhythm.     Pulses: Normal pulses.     Heart sounds: Normal heart sounds. No murmur heard. Pulmonary:     Effort: Pulmonary effort is normal. No respiratory distress.     Breath sounds: Normal breath sounds. No wheezing or rales.  Musculoskeletal:     Comments: Wearing left leg compression stocking  Skin:    General: Skin is warm and dry.     Findings: No rash.  Neurological:     Mental Status: He is alert.  Psychiatric:        Mood and Affect: Mood normal.        Behavior: Behavior normal.       Results for orders placed or performed in visit on 05/10/24  POCT glycosylated hemoglobin (Hb A1C)   Collection Time: 05/10/24  7:58 AM  Result Value Ref Range   Hemoglobin A1C 9.4 (A) 4.0 - 5.6 %   HbA1c POC (<> result, manual entry)     HbA1c, POC (prediabetic range)     HbA1c, POC (controlled diabetic range)     Lab Results  Component Value Date   NA 139 04/22/2024   CL 101 04/22/2024   K 3.9 04/22/2024   CO2 29 04/22/2024   BUN 20 04/22/2024   CREATININE 1.19 04/22/2024   GFR 69.26 04/22/2024   CALCIUM 8.6 04/22/2024   PHOS 3.3 03/08/2024   ALBUMIN 4.6 03/08/2024   GLUCOSE 247 (H) 04/22/2024   Assessment &  Plan:   Problem List Items Addressed This Visit     Retroperitoneal fibrosis (Chronic)   Appreciate [facility] rheum and heme/onc care.  Currently on cellcept  + Humira.  Tapering off methylprednisolone.      Chronic periodontal disease   Just completed root canal with recurrent R lower dental pain - rec f/u with dentist, provided with WASP for amoxicillin  to fill if unable to get in with dentist in a timely fashion and pain/symptoms worsen      Acute kidney injury superimposed on chronic kidney  disease   Update renal panel.       Solitary kidney, acquired   Type 2 diabetes mellitus with other specified complication (HCC) - Primary   Reviewed new diagnosis - anticipate steroid-induced.  Provided with diabetes nutrition handout. Declines DSME.  Discussed glycemic goals/range.  Start glipizide 5mg  daily with breakfast.  Reassess at 3 mo control - he notes he is planning to stop methylprednisolone use next week.       Relevant Medications   glipiZIDE (GLUCOTROL) 5 MG tablet     Meds ordered this encounter  Medications   solifenacin (VESICARE) 5 MG tablet    Sig: Take 1 tablet (5 mg total) by mouth daily.    Dispense:  90 tablet    Refill:  0   Blood Glucose Monitoring Suppl DEVI    Sig: 1 each by Does not apply route as directed. Dispense based on patient and insurance preference. Use up to four times daily as directed. (FOR ICD-10 E10.9, E11.9).    Dispense:  1 each    Refill:  0   Glucose Blood (BLOOD GLUCOSE TEST STRIPS) STRP    Sig: 1 each by Does not apply route as directed. Dispense based on patient and insurance preference. Use up to four times daily as directed. (FOR ICD-10 E10.9, E11.9).    Dispense:  100 strip    Refill:  0   Lancet Device MISC    Sig: 1 each by Does not apply route as directed. Dispense based on patient and insurance preference. Use up to four times daily as directed. (FOR ICD-10 E10.9, E11.9).    Dispense:  1 each    Refill:  0   Lancets MISC     Sig: 1 each by Does not apply route as directed. Dispense based on patient and insurance preference. Use up to four times daily as directed. (FOR ICD-10 E10.9, E11.9).    Dispense:  100 each    Refill:  0   glipiZIDE (GLUCOTROL) 5 MG tablet    Sig: Take 1 tablet (5 mg total) by mouth daily before breakfast.    Dispense:  90 tablet    Refill:  1   amoxicillin  (AMOXIL ) 875 MG tablet    Sig: Take 1 tablet (875 mg total) by mouth 2 (two) times daily for 7 days.    Dispense:  14 tablet    Refill:  0    Orders Placed This Encounter  Procedures   POCT glycosylated hemoglobin (Hb A1C)    Patient Instructions  Flu shot today  Labs today (standing) Work on low sugar low carb diabetic diet  Start glipizide 5mg  daily with breakfast.  I will order glucometer with test supplies to pharmacy. Goal fasting sugar 80-120.  Goal sugar 2 hours after a meal <180.  Take amoxicillin  875mg  twice daily for 7 days if worsening tooth ache and unable to get in soon with dentist.  Return in 3 months for physical  Follow up plan: Return in about 3 months (around 08/10/2024), or if symptoms worsen or fail to improve, for annual exam, prior fasting for blood work.  Anton Blas, MD

## 2024-05-10 NOTE — Assessment & Plan Note (Signed)
 Just completed root canal with recurrent R lower dental pain - rec f/u with dentist, provided with WASP for amoxicillin  to fill if unable to get in with dentist in a timely fashion and pain/symptoms worsen

## 2024-05-10 NOTE — Assessment & Plan Note (Addendum)
 Appreciate [facility] rheum and heme/onc care.  Currently on cellcept  + Humira.  Tapering off methylprednisolone.

## 2024-05-10 NOTE — Patient Instructions (Addendum)
 Flu shot today  Labs today (standing) Work on low sugar low carb diabetic diet  Start glipizide 5mg  daily with breakfast.  I will order glucometer with test supplies to pharmacy. Goal fasting sugar 80-120.  Goal sugar 2 hours after a meal <180.  Take amoxicillin  875mg  twice daily for 7 days if worsening tooth ache and unable to get in soon with dentist.  Return in 3 months for physical

## 2024-05-10 NOTE — Assessment & Plan Note (Signed)
Update renal panel 

## 2024-05-13 ENCOUNTER — Ambulatory Visit: Payer: Self-pay | Admitting: Family Medicine

## 2024-05-13 DIAGNOSIS — N179 Acute kidney failure, unspecified: Secondary | ICD-10-CM

## 2024-05-13 DIAGNOSIS — E1169 Type 2 diabetes mellitus with other specified complication: Secondary | ICD-10-CM

## 2024-05-30 ENCOUNTER — Encounter: Payer: Self-pay | Admitting: Family Medicine

## 2024-06-03 ENCOUNTER — Other Ambulatory Visit

## 2024-06-03 DIAGNOSIS — N179 Acute kidney failure, unspecified: Secondary | ICD-10-CM

## 2024-06-03 DIAGNOSIS — N139 Obstructive and reflux uropathy, unspecified: Secondary | ICD-10-CM

## 2024-06-03 DIAGNOSIS — E1169 Type 2 diabetes mellitus with other specified complication: Secondary | ICD-10-CM

## 2024-06-03 LAB — BASIC METABOLIC PANEL WITH GFR
BUN: 14 mg/dL (ref 6–23)
CO2: 25 meq/L (ref 19–32)
Calcium: 9.1 mg/dL (ref 8.4–10.5)
Chloride: 109 meq/L (ref 96–112)
Creatinine, Ser: 1.11 mg/dL (ref 0.40–1.50)
GFR: 75.24 mL/min (ref >60.00)
Glucose, Bld: 83 mg/dL (ref 70–99)
Potassium: 4.1 meq/L (ref 3.5–5.1)
Sodium: 144 meq/L (ref 135–145)

## 2024-06-03 LAB — CBC WITH DIFFERENTIAL/PLATELET
Basophils Absolute: 0 K/uL (ref 0.0–0.1)
Basophils Relative: 0.9 % (ref 0.0–3.0)
Eosinophils Absolute: 0.2 K/uL (ref 0.0–0.7)
Eosinophils Relative: 3.3 % (ref 0.0–5.0)
HCT: 37.4 % — ABNORMAL LOW (ref 39.0–52.0)
Hemoglobin: 12.8 g/dL — ABNORMAL LOW (ref 13.0–17.0)
Lymphocytes Relative: 29 % (ref 12.0–46.0)
Lymphs Abs: 1.4 K/uL (ref 0.7–4.0)
MCHC: 34.2 g/dL (ref 30.0–36.0)
MCV: 85.4 fl (ref 78.0–100.0)
Monocytes Absolute: 0.4 K/uL (ref 0.1–1.0)
Monocytes Relative: 7.5 % (ref 3.0–12.0)
Neutro Abs: 2.8 K/uL (ref 1.4–7.7)
Neutrophils Relative %: 59.3 % (ref 43.0–77.0)
Platelets: 239 K/uL (ref 150.0–400.0)
RBC: 4.37 Mil/uL (ref 4.22–5.81)
RDW: 14.4 % (ref 11.5–15.5)
WBC: 4.7 K/uL (ref 4.0–10.5)

## 2024-06-05 ENCOUNTER — Other Ambulatory Visit: Payer: Self-pay | Admitting: Family Medicine

## 2024-06-05 LAB — FRUCTOSAMINE: Fructosamine: 246 umol/L (ref 205–285)

## 2024-06-07 ENCOUNTER — Ambulatory Visit: Payer: Self-pay | Admitting: Family Medicine

## 2024-06-28 ENCOUNTER — Telehealth: Payer: Self-pay

## 2024-06-28 DIAGNOSIS — N139 Obstructive and reflux uropathy, unspecified: Secondary | ICD-10-CM

## 2024-06-28 DIAGNOSIS — N179 Acute kidney failure, unspecified: Secondary | ICD-10-CM

## 2024-06-28 NOTE — Telephone Encounter (Signed)
 Copied from CRM [id number]. Topic: Clinical - Request for Lab/Test Order >> Jun 26, 2024  2:14 PM Chasity T wrote: Reason for CRM: Pt is needing lab orders placed for continues check kidney function. Once orders are done please call pt to schedule

## 2024-07-01 NOTE — Telephone Encounter (Signed)
 New standing orders placed for BMP, CBC Q4 wks.  Plz schedule for lab visit

## 2024-07-01 NOTE — Telephone Encounter (Signed)
 Copied from CRM [id number]. Topic: Clinical - Request for Lab/Test Order >> Jun 26, 2024  2:14 PM Chasity T wrote: Reason for CRM: Pt is needing lab orders placed for continues check kidney function. Once orders are done please call pt to schedule >> Jul 01, 2024  8:00 AM Robinson H wrote: Patient following up on message sent 12/31 regarding basic metabolic labs he states he's supposed to have done every 3 weeks per Dr. G  Foch [phone]      Orders have been placed please call to set up lab appointment.

## 2024-07-01 NOTE — Addendum Note (Signed)
 Addended by: RILLA BALLER on: 07/01/2024 10:12 AM   Modules accepted: Orders

## 2024-07-03 ENCOUNTER — Other Ambulatory Visit

## 2024-07-03 DIAGNOSIS — N139 Obstructive and reflux uropathy, unspecified: Secondary | ICD-10-CM | POA: Diagnosis not present

## 2024-07-03 DIAGNOSIS — N179 Acute kidney failure, unspecified: Secondary | ICD-10-CM | POA: Diagnosis not present

## 2024-07-03 DIAGNOSIS — N189 Chronic kidney disease, unspecified: Secondary | ICD-10-CM | POA: Diagnosis not present

## 2024-07-03 LAB — BASIC METABOLIC PANEL WITH GFR
BUN: 15 mg/dL (ref 6–23)
CO2: 28 meq/L (ref 19–32)
Calcium: 8.6 mg/dL (ref 8.4–10.5)
Chloride: 106 meq/L (ref 96–112)
Creatinine, Ser: 1.4 mg/dL (ref 0.40–1.50)
GFR: 56.91 mL/min — ABNORMAL LOW
Glucose, Bld: 144 mg/dL — ABNORMAL HIGH (ref 70–99)
Potassium: 4.1 meq/L (ref 3.5–5.1)
Sodium: 140 meq/L (ref 135–145)

## 2024-07-03 LAB — CBC WITH DIFFERENTIAL/PLATELET
Basophils Absolute: 0 K/uL (ref 0.0–0.1)
Basophils Relative: 0.7 % (ref 0.0–3.0)
Eosinophils Absolute: 0.2 K/uL (ref 0.0–0.7)
Eosinophils Relative: 4.4 % (ref 0.0–5.0)
HCT: 39.6 % (ref 39.0–52.0)
Hemoglobin: 13.4 g/dL (ref 13.0–17.0)
Lymphocytes Relative: 22 % (ref 12.0–46.0)
Lymphs Abs: 1.1 K/uL (ref 0.7–4.0)
MCHC: 33.8 g/dL (ref 30.0–36.0)
MCV: 84.8 fl (ref 78.0–100.0)
Monocytes Absolute: 0.4 K/uL (ref 0.1–1.0)
Monocytes Relative: 7.2 % (ref 3.0–12.0)
Neutro Abs: 3.3 K/uL (ref 1.4–7.7)
Neutrophils Relative %: 65.7 % (ref 43.0–77.0)
Platelets: 198 K/uL (ref 150.0–400.0)
RBC: 4.67 Mil/uL (ref 4.22–5.81)
RDW: 12.6 % (ref 11.5–15.5)
WBC: 5 K/uL (ref 4.0–10.5)

## 2024-07-08 ENCOUNTER — Ambulatory Visit: Payer: Self-pay | Admitting: Family Medicine

## 2024-07-31 ENCOUNTER — Ambulatory Visit: Payer: Self-pay | Admitting: Family Medicine

## 2024-07-31 ENCOUNTER — Ambulatory Visit

## 2024-07-31 ENCOUNTER — Other Ambulatory Visit

## 2024-07-31 ENCOUNTER — Ambulatory Visit: Payer: Self-pay

## 2024-07-31 ENCOUNTER — Encounter: Payer: Self-pay | Admitting: Family Medicine

## 2024-07-31 VITALS — HR 74 | Temp 99.6°F | Ht 75.0 in | Wt 220.0 lb

## 2024-07-31 LAB — CBC WITH DIFFERENTIAL/PLATELET
Basophils Absolute: 0 10*3/uL (ref 0.0–0.1)
Basophils Relative: 0.9 % (ref 0.0–3.0)
Eosinophils Absolute: 0.2 10*3/uL (ref 0.0–0.7)
Eosinophils Relative: 3.6 % (ref 0.0–5.0)
HCT: 40.5 % (ref 39.0–52.0)
Hemoglobin: 13.9 g/dL (ref 13.0–17.0)
Lymphocytes Relative: 27.7 % (ref 12.0–46.0)
Lymphs Abs: 1.3 10*3/uL (ref 0.7–4.0)
MCHC: 34.3 g/dL (ref 30.0–36.0)
MCV: 83.2 fl (ref 78.0–100.0)
Monocytes Absolute: 0.3 10*3/uL (ref 0.1–1.0)
Monocytes Relative: 6.8 % (ref 3.0–12.0)
Neutro Abs: 2.9 10*3/uL (ref 1.4–7.7)
Neutrophils Relative %: 61 % (ref 43.0–77.0)
Platelets: 203 10*3/uL (ref 150.0–400.0)
RBC: 4.87 Mil/uL (ref 4.22–5.81)
RDW: 12.2 % (ref 11.5–15.5)
WBC: 4.8 10*3/uL (ref 4.0–10.5)

## 2024-07-31 LAB — URINALYSIS, ROUTINE W REFLEX MICROSCOPIC
Bilirubin Urine: NEGATIVE
Ketones, ur: NEGATIVE
Nitrite: NEGATIVE
Specific Gravity, Urine: 1.02 (ref 1.000–1.030)
Total Protein, Urine: 100 — AB
Urine Glucose: NEGATIVE
Urobilinogen, UA: 0.2 (ref 0.0–1.0)
pH: 6.5 (ref 5.0–8.0)

## 2024-07-31 LAB — BASIC METABOLIC PANEL WITH GFR
BUN: 20 mg/dL (ref 6–23)
CO2: 24 meq/L (ref 19–32)
Calcium: 8.9 mg/dL (ref 8.4–10.5)
Chloride: 108 meq/L (ref 96–112)
Creatinine, Ser: 1.38 mg/dL (ref 0.40–1.50)
GFR: 57.87 mL/min — ABNORMAL LOW
Glucose, Bld: 106 mg/dL — ABNORMAL HIGH (ref 70–99)
Potassium: 4.3 meq/L (ref 3.5–5.1)
Sodium: 141 meq/L (ref 135–145)

## 2024-07-31 NOTE — Telephone Encounter (Signed)
 Agree with this. Labs reviewed, released via mychart.

## 2024-07-31 NOTE — Telephone Encounter (Signed)
 Pt has extensive kidney history for which Dr. KANDICE orders q 3 week labs (per rheumatology). Pt reports mild intermittent R sided flank pain for a few days, denies other symptoms. Pt is 3 weeks post kidney stent placement. Pt scheduled for an acute appt this AM with Dr. Watt but has been advised to seek care at ED if anything worsens or changes.     FYI Only or Action Required?: FYI only for provider: appointment scheduled on 2/4.  Patient was last seen in primary care on 05/10/2024 by Rilla Baller, MD.  Called Nurse Triage reporting Flank Pain.  Symptoms began several days ago.  Interventions attempted: Nothing.  Symptoms are: stable.  Triage Disposition: See Physician Within 24 Hours  Patient/caregiver understands and will follow disposition?: Yes     ________________________________ Reason for Triage: Patient states he is having some pain in his right flank, only has 1 kidney and wants to make sure its working fine and have blood work done.    Reason for Disposition  Diabetes mellitus or weak immune system (e.g., HIV positive, cancer chemo, splenectomy, organ transplant, chronic steroids)  (Exception: Mild pain that is only present with movement.)    3 weeks post stent placement, has 1 kidney  Answer Assessment - Initial Assessment Questions 1. LOCATION: Where does it hurt? (e.g., left, right)     R flank pain  2. ONSET: When did the pain start?     Couple days  3. SEVERITY: How bad is the pain? (e.g., Scale 1-10; mild, moderate, or severe)     Not horrible   4. PATTERN: Does the pain come and go, or is it constant?      Come and go   5. CAUSE: What do you think is causing the pain?     Pt has extensive kidney history, has had left kidney removed in September, recently had stent placed 3 weeks ago   6. OTHER SYMPTOMS:  Do you have any other symptoms? (e.g., fever, abdomen pain, vomiting, leg weakness, burning with urination, blood in urine)      Denies  Protocols used: Flank Pain-A-AH

## 2024-07-31 NOTE — Addendum Note (Signed)
 Addended by: WENDELL ARLAND RAMAN on: 07/31/2024 11:30 AM   Modules accepted: Orders

## 2024-07-31 NOTE — Telephone Encounter (Signed)
 When patient arrived for Dr. Eleanore appointment today,  he said he call the office today to schedule lab only appointment.  He states they told him they did not see any lab orders for him.  He mentioned having the flank pain so he was transferred to the triage nurse and she scheduled him an appointment today with Dr. Watt.   When rooming the patient he states he really just needed a lab appointment.  He is not concerned about the flank pain because he just has a stint put in 2 weeks ago and was told some pain was to be expected.  I spoke with Dr. Rilla and he thought patient was okay to do a lab only appointment.  He does have standing orders in patient's chart for CBC with Diff and BMET.   We also added a UA with reflex to microscopic.  Patient was moved the lab schedule.

## 2024-07-31 NOTE — Progress Notes (Unsigned)
 "    [name], MD, CAQ Sports Medicine [facility] [address] [place], [address]  Phone: [phone]  FAX: [phone]  Keith Bishop - 54 y.o. male  MRN [id number]  Date of Birth: Dec 29, 1970  Date: 07/31/2024  PCP: Rilla Baller, MD  Referral: Rilla Baller, MD  Chief Complaint  Patient presents with   Flank Pain    Right Stent placed 07/11/24   Subjective:   Son Keith Bishop is a 77 y.o. very pleasant male patient with Body mass index is 27.5 kg/m. who presents with the following:  Discussed the use of AI scribe software for clinical note transcription with the patient, who gave verbal consent to proceed.   History of Present Illness     Review of Systems is noted in the HPI, as appropriate  Objective:   Pulse 74   Temp 99.6 F (37.6 C) (Temporal)   Ht 6' 3 (1.905 m)   Wt 220 lb (99.8 kg)   SpO2 97%   BMI 27.50 kg/m   GEN: No acute distress; alert,appropriate. PULM: Breathing comfortably in no respiratory distress PSYCH: Normally interactive.   Laboratory and Imaging Data:  Assessment and Plan:     ICD-10-CM   1. Right flank pain  R10.A1     2. Decreased renal function  N28.9      Assessment & Plan   Medication Management during today's office visit: No orders of the defined types were placed in this encounter.  Medications Discontinued During This Encounter  Medication Reason   methylPREDNISolone (MEDROL) 4 MG tablet Duplicate   Blood Glucose Monitoring Suppl DEVI Duplicate   Glucose Blood (BLOOD GLUCOSE TEST STRIPS) STRP Duplicate    Orders placed today for conditions managed today: No orders of the defined types were placed in this encounter.   Disposition: No follow-ups on file.  [employer] One speech-to-text software was used for transcription in this dictation.  Possible transcriptional errors can occur using Animal nutritionist.   Signed,  [name]. Kenden Brandt, MD   Outpatient Encounter Medications as of 07/31/2024  Medication Sig   ACCU-CHEK GUIDE TEST test strip SMARTSIG:1-4 Times Daily   acetaminophen  (TYLENOL ) 500 MG tablet Take 500 mg by mouth every 6 (six) hours as needed.   Adalimumab (HUMIRA, 1 PEN, [place]) Inject 0.4 mLs into the skin every 14 (fourteen) days.   amLODipine  (NORVASC ) 5 MG tablet TAKE 1 TABLET (5 MG TOTAL) BY MOUTH DAILY.   Blood Glucose Monitoring Suppl (ACCU-CHEK GUIDE ME) w/Device KIT as directed.   escitalopram  (LEXAPRO ) 5 MG tablet TAKE 1 TABLET (5 MG TOTAL) BY MOUTH DAILY.   Lancet Device MISC 1 each by Does not apply route as directed. Dispense based on patient and insurance preference. Use up to four times daily as directed. (FOR ICD-10 E10.9, E11.9).   Lancets MISC 1 each by Does not apply route as directed. Dispense based on patient and insurance preference. Use up to four times daily as directed. (FOR ICD-10 E10.9, E11.9).   mycophenolate  (CELLCEPT ) 500 MG tablet Take 3 tablets (1,500 mg total) by mouth 2 (two) times daily.   solifenacin  (VESICARE ) 5 MG tablet Take 1 tablet (5 mg total) by mouth daily.   [DISCONTINUED] Blood Glucose Monitoring Suppl DEVI 1 each by Does not apply route as directed. Dispense based on patient and insurance preference. Use up to four times daily as directed. (FOR ICD-10 E10.9, E11.9).   [DISCONTINUED] Glucose Blood (BLOOD GLUCOSE TEST STRIPS)  STRP 1 each by Does not apply route as directed. Dispense based on patient and insurance preference. Use up to four times daily as directed. (FOR ICD-10 E10.9, E11.9).   [DISCONTINUED] methylPREDNISolone (MEDROL) 4 MG tablet Take 4 mg by mouth daily.   No facility-administered encounter medications on file as of 07/31/2024.   "

## 2024-07-31 NOTE — Telephone Encounter (Signed)
 Noted. Appreciate Dr Watt seeing this pleasant patient.  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  Sees [facility] rheum and uro for care.
